# Patient Record
Sex: Female | Born: 1955 | Race: White | Hispanic: No | Marital: Married | Smoking: Never smoker
Health system: Southern US, Community
[De-identification: ages and names within clinical notes are randomized; demographics above are authoritative.]

## PROBLEM LIST (undated history)

## (undated) DIAGNOSIS — K219 Gastro-esophageal reflux disease without esophagitis: Secondary | ICD-10-CM

## (undated) DIAGNOSIS — I10 Essential (primary) hypertension: Secondary | ICD-10-CM

## (undated) DIAGNOSIS — J986 Disorders of diaphragm: Secondary | ICD-10-CM

## (undated) DIAGNOSIS — K900 Celiac disease: Secondary | ICD-10-CM

## (undated) DIAGNOSIS — J3800 Paralysis of vocal cords and larynx, unspecified: Secondary | ICD-10-CM

## (undated) HISTORY — PX: BREAST EXCISIONAL BIOPSY: SUR124

## (undated) HISTORY — DX: Disorders of diaphragm: J98.6

## (undated) HISTORY — DX: Essential (primary) hypertension: I10

## (undated) HISTORY — DX: Paralysis of vocal cords and larynx, unspecified: J38.00

## (undated) HISTORY — PX: SPINE SURGERY: SHX786

## (undated) HISTORY — PX: ROTATOR CUFF REPAIR: SHX139

## (undated) HISTORY — PX: BREAST SURGERY: SHX581

## (undated) HISTORY — DX: Gastro-esophageal reflux disease without esophagitis: K21.9

## (undated) HISTORY — DX: Celiac disease: K90.0

## (undated) HISTORY — PX: DILATION AND CURETTAGE OF UTERUS: SHX78

---
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
## 2000-08-05 ENCOUNTER — Ambulatory Visit (HOSPITAL_COMMUNITY): Admission: RE | Admit: 2000-08-05 | Discharge: 2000-08-05 | Payer: Self-pay | Admitting: Pulmonary Disease

## 2000-08-13 ENCOUNTER — Ambulatory Visit (HOSPITAL_COMMUNITY): Admission: RE | Admit: 2000-08-13 | Discharge: 2000-08-13 | Payer: Self-pay | Admitting: Pulmonary Disease

## 2000-08-30 ENCOUNTER — Ambulatory Visit (HOSPITAL_COMMUNITY): Admission: RE | Admit: 2000-08-30 | Discharge: 2000-08-30 | Payer: Self-pay | Admitting: Urology

## 2000-08-30 ENCOUNTER — Encounter: Payer: Self-pay | Admitting: Urology

## 2000-09-11 ENCOUNTER — Encounter: Payer: Self-pay | Admitting: Urology

## 2000-09-11 ENCOUNTER — Ambulatory Visit (HOSPITAL_COMMUNITY): Admission: RE | Admit: 2000-09-11 | Discharge: 2000-09-11 | Payer: Self-pay | Admitting: Urology

## 2000-10-18 ENCOUNTER — Ambulatory Visit (HOSPITAL_COMMUNITY): Admission: RE | Admit: 2000-10-18 | Discharge: 2000-10-18 | Payer: Self-pay | Admitting: Internal Medicine

## 2000-10-18 ENCOUNTER — Encounter: Payer: Self-pay | Admitting: Internal Medicine

## 2000-11-18 ENCOUNTER — Ambulatory Visit (HOSPITAL_COMMUNITY): Admission: RE | Admit: 2000-11-18 | Discharge: 2000-11-18 | Payer: Self-pay | Admitting: Internal Medicine

## 2001-01-06 ENCOUNTER — Ambulatory Visit (HOSPITAL_COMMUNITY): Admission: RE | Admit: 2001-01-06 | Discharge: 2001-01-06 | Payer: Self-pay | Admitting: Internal Medicine

## 2001-02-04 ENCOUNTER — Other Ambulatory Visit: Admission: RE | Admit: 2001-02-04 | Discharge: 2001-02-04 | Payer: Self-pay | Admitting: Obstetrics and Gynecology

## 2001-08-05 ENCOUNTER — Ambulatory Visit (HOSPITAL_COMMUNITY): Admission: RE | Admit: 2001-08-05 | Discharge: 2001-08-05 | Payer: Self-pay | Admitting: Pulmonary Disease

## 2001-08-05 ENCOUNTER — Encounter: Payer: Self-pay | Admitting: Obstetrics and Gynecology

## 2001-08-05 ENCOUNTER — Ambulatory Visit (HOSPITAL_COMMUNITY): Admission: RE | Admit: 2001-08-05 | Discharge: 2001-08-05 | Payer: Self-pay | Admitting: Obstetrics and Gynecology

## 2001-09-15 ENCOUNTER — Encounter: Payer: Self-pay | Admitting: Orthopaedic Surgery

## 2001-09-15 ENCOUNTER — Ambulatory Visit (HOSPITAL_COMMUNITY): Admission: RE | Admit: 2001-09-15 | Discharge: 2001-09-15 | Payer: Self-pay | Admitting: Orthopaedic Surgery

## 2002-02-04 ENCOUNTER — Ambulatory Visit (HOSPITAL_COMMUNITY): Admission: RE | Admit: 2002-02-04 | Discharge: 2002-02-04 | Payer: Self-pay | Admitting: Pulmonary Disease

## 2002-09-29 ENCOUNTER — Ambulatory Visit (HOSPITAL_COMMUNITY): Admission: RE | Admit: 2002-09-29 | Discharge: 2002-09-29 | Payer: Self-pay | Admitting: Specialist

## 2002-09-29 ENCOUNTER — Encounter: Payer: Self-pay | Admitting: Specialist

## 2005-04-30 ENCOUNTER — Ambulatory Visit (HOSPITAL_COMMUNITY): Admission: RE | Admit: 2005-04-30 | Discharge: 2005-04-30 | Payer: Self-pay | Admitting: Obstetrics and Gynecology

## 2006-04-26 ENCOUNTER — Ambulatory Visit (HOSPITAL_COMMUNITY): Admission: RE | Admit: 2006-04-26 | Discharge: 2006-04-26 | Payer: Self-pay | Admitting: Obstetrics and Gynecology

## 2006-12-31 ENCOUNTER — Ambulatory Visit: Payer: Self-pay | Admitting: Gastroenterology

## 2007-01-17 ENCOUNTER — Encounter: Payer: Self-pay | Admitting: Internal Medicine

## 2007-01-17 ENCOUNTER — Ambulatory Visit: Payer: Self-pay | Admitting: Internal Medicine

## 2007-01-17 ENCOUNTER — Ambulatory Visit (HOSPITAL_COMMUNITY): Admission: RE | Admit: 2007-01-17 | Discharge: 2007-01-17 | Payer: Self-pay | Admitting: Internal Medicine

## 2007-02-13 ENCOUNTER — Encounter (HOSPITAL_COMMUNITY): Admission: RE | Admit: 2007-02-13 | Discharge: 2007-03-15 | Payer: Self-pay | Admitting: Internal Medicine

## 2007-03-20 ENCOUNTER — Ambulatory Visit: Payer: Self-pay | Admitting: Internal Medicine

## 2007-04-29 ENCOUNTER — Ambulatory Visit (HOSPITAL_COMMUNITY): Admission: RE | Admit: 2007-04-29 | Discharge: 2007-04-29 | Payer: Self-pay | Admitting: Obstetrics and Gynecology

## 2007-09-16 ENCOUNTER — Other Ambulatory Visit: Admission: RE | Admit: 2007-09-16 | Discharge: 2007-09-16 | Payer: Self-pay | Admitting: Obstetrics and Gynecology

## 2007-11-04 DIAGNOSIS — J301 Allergic rhinitis due to pollen: Secondary | ICD-10-CM | POA: Insufficient documentation

## 2007-11-04 DIAGNOSIS — R1013 Epigastric pain: Secondary | ICD-10-CM

## 2007-11-04 DIAGNOSIS — R198 Other specified symptoms and signs involving the digestive system and abdomen: Secondary | ICD-10-CM

## 2007-11-04 DIAGNOSIS — E781 Pure hyperglyceridemia: Secondary | ICD-10-CM

## 2007-11-04 DIAGNOSIS — K921 Melena: Secondary | ICD-10-CM

## 2007-11-04 DIAGNOSIS — R740 Nonspecific elevation of levels of transaminase and lactic acid dehydrogenase [LDH]: Secondary | ICD-10-CM

## 2007-11-05 DIAGNOSIS — K900 Celiac disease: Secondary | ICD-10-CM

## 2007-11-05 DIAGNOSIS — K219 Gastro-esophageal reflux disease without esophagitis: Secondary | ICD-10-CM | POA: Insufficient documentation

## 2008-05-24 ENCOUNTER — Ambulatory Visit (HOSPITAL_COMMUNITY): Admission: RE | Admit: 2008-05-24 | Discharge: 2008-05-24 | Payer: Self-pay | Admitting: Obstetrics and Gynecology

## 2010-08-15 NOTE — Consult Note (Signed)
NAMEANJANA, CHEEK                 ACCOUNT NO.:  [id number]   MEDICAL RECORD NO.:  [id number]           PATIENT TYPE:  AMB   LOCATION:  DAY                           FACILITY:  [facility]   PHYSICIAN:  R. Garfield Cornea, M.D. DATE OF BIRTH:  [birth year]   DATE OF CONSULTATION:  12/31/2006  DATE OF DISCHARGE:                                 CONSULTATION   REASON FOR CONSULTATION:  Right upper quadrant abdominal pain, rectal  bleeding.   HISTORY OF PRESENT ILLNESS:  The patient is a 55 year old Caucasian  female who presents for further evaluation of right upper quadrant  abdominal pain and rectal bleeding.  She states for 7 to 8 weeks she has  had postprandial epigastric/right upper quadrant abdominal pain  associated with nausea but no vomiting.  She complains of early satiety  and abdominal bloating postprandially. She denies any heartburn,  dysphagia or odynophagia. She is on famotidine 20 mg daily for her GERD.  She has been on this for about 2 years.  Previously she was on Prilosec.  She also takes Aleve once daily.  She had been taking digestive enzymes  daily.  She has a history of celiac disease.  She has changed her diet  and hopes to lose some weight she states. In actuality, she has gained  about 10 pounds in the last 2 months.  She is following [employer].  She has been trying to follow a gluten-free diet strictly.  Her bowel  movements occur about every other day.  She denies any hard stools.  Often her stools are watery.  She has passed fresh blood per rectum for  about 2 weeks.  She had an abdominal ultrasound on 12/12/06, which  revealed fatty liver.  TSH was normal, CBC normal, AST was 26, ALT  slightly elevated at 43.  Glucose was 91.   ALLERGIES:  CODEINE.   PAST MEDICAL HISTORY:  1. Hypertension.  2. GERD.  3. Celiac disease.  4. Seasonal allergies.  5. Colonoscopy October of 2002, was normal including normal terminal      ileum.  6. EGD August of 2002, revealed  a small hiatal hernia.  Biopsies were      positive for celiac disease.   PAST SURGICAL HISTORY:  1. She had fusion on the lumbar spine.  2. Tubal ligation.  3. D and C.  4. Partial hysterectomy.   FAMILY HISTORY:  Mother is deceased age 72. Had a history of breast  cancer and pulmonary embolus.  Father is 35 and in good health. No  family history of colorectal cancer.   SOCIAL HISTORY:  She is married.  She has no children.  She is employed  at [employer].  She has never been a smoker.  No alcohol or drug use.   REASON FOR ADMISSION:  GI:  HPI.  CONSTITUTIONAL:  Denies any weight,  actually has gained 10 pounds in 2 months.  CARDIOPULMONARY:  Denies  chest pain or shortness of breath.  GENITOURINARY: Denies dysuria or  hematuria.   PHYSICAL EXAMINATION:  VITAL SIGNS: Weight 175, height  5 feet 7 inches,  temp 98.4, blood pressure 110/80, pulse 64.  GENERAL: Pleasant, well-nourished, well-developed Caucasian female in no  acute distress.  SKIN: Warm and dry.  HEENT: Sclerae nonicteric.  Oropharyngeal mucosa moist and pink.  No  lesions, erythema or exudate.  No lymphadenopathy.  CHEST:  Lungs are clear to auscultation.  CARDIAC:  Exam reveals regular rate and rhythm.  No murmurs, rubs or  gallops.  ABDOMEN:  Positive bowel sounds.  The abdomen is soft, nondistended. She  has mild epigastric/right upper quadrant tenderness to deep palpation.  No organomegaly or masses.  No rebound, tenderness or guarding.  No  hepatosplenomegaly or masses.  RECTAL:  Exam reveals small external hemorrhoid.  No evidence of  thrombosis or bleeding.  No masses in the rectal vault.  Secretions are  heme positive.  EXTREMITIES:  No edema.   IMPRESSION:  The patient is a 55 year old lady with history of celiac  disease and chronic gastroesophageal reflux disease who presents with a  7 to 8 weeks history of postprandial epigastric/right upper quadrant  abdominal discomfort, bloating, early satiety  and nausea. She denies any  typical gastroesophageal reflux disease symptoms. She was switched from  a proton pump inhibitor to H2 blocker a couple of years ago.  Recent  abdominal ultrasound unremarkable. Her ALT was slightly elevated, but  this is most likely due to fatty liver based on ultrasound.  She may be  having some dyspepsia related to recent change in her diet.  She is on  Aleve which would increase her risk for peptic ulcer disease.  She also  has a 2-week history of hematochezia with bowel movements most likely  due to a benign anorectal source such has hemorrhoids.  Last endoscopic  evaluation was over 6 years ago.   PLAN:  1. EGD and colonoscopy in the near future.  2. Will recheck her LFTs.  3. Change famotidine to Nexium 40 mg daily, #2 weeks' samples      provided.  4. Further recommendations to follow.   I would like to thank Dr. Scotty Court for allowing [name] to take part in the  care of this patient.      Neil Crouch, P.ABridgette Habermann, M.D.  Electronically Signed    LL/MEDQ  D:  12/31/2006  T:  12/31/2006  Job:  [id number]   cc:   Matthias Hughs  Fax: [phone]

## 2010-08-15 NOTE — Assessment & Plan Note (Signed)
NAME:  ASHLE, STIEF                  CHART#:  [id number]   DATE:  03/20/2007                       DOB:  [birth year]   HISTORY OF PRESENT ILLNESS:  Followup epigastric, right upper quadrant  abdominal pain, history of blunted villi but negative transglutaminase  antibody screen when she was seen recently for the above mentioned  symptoms.  Underwent EGD on 01/17/2007 which demonstrated small hiatal  hernia.  Had some erosions in her antrum.  Her small bowel appeared  normal and was rebiopsied.  Her small bowel appeared normal  histologically and she had some reactive gastropathy changes in her  gastric mucosal biopsies.  Ultrasound demonstrated fatty appearing liver  and gallbladder looked okay.  HIDA scan with a fatty meal demonstrated  gallbladder EF of 49%.  It was 55% in 2002.  She was switched from  famotidine to Nexium when she was seen here 12/31/2006.  She was overall  significantly improved.  Epigastric and right upper quadrant discomfort  has subsided.  She for the better part of the past 10 years has tried to  adhere to a gluten free diet as this has always made her feel better but  she has not always been dogmatic about complete abstinence of gluten.  She had a colonoscopy back in 2002 which revealed normal rectum, colon  and terminal ileum.  On routine labs she had an isolated elevated SGPT  of 55 with her SGOT, alkaline phosphatase and bilirubin being in the  normal range.   CURRENT MEDICATIONS:  See updated list.   ALLERGIES:  Codeine.   PHYSICAL EXAMINATION:  GENERAL:  Exam today appears very pleasant 55-  year-old lady resting comfortably.  VITAL SIGNS:  Weight 170, height 5 feet 7 inches, BP 118/70, pulse 60.  SKIN:  Skin warm and dry.  ABDOMEN:  Abdomen is flat.  Positive bowel sounds.  Soft, nontender  without appreciable mass or hepatosplenomegaly.   ASSESSMENT:  Vague epigastric, right upper quadrant abdominal pain,  better with switching from H2 blocker to  Nexium.  She has history of  villous atrophy with small bowel biopsies with more recent biopsy  demonstrating normal villous architecture in a setting of a swelling  gluten restricted diet.  Her transglutaminase antibody was previously  negative.  I suspect this lady has more of an element of gluten  sensitivity or allergy rather than true celiac disease although as there  is a spectrum.  I suspect she is having more gastroesophageal reflux  disease to account for her recent upper GI symptoms than anything else.  She has a slight diminution in her gallbladder EF and this may be  heralding clinically overt biliary dyskinesia in the future but at this  point in time I have recommended Ms. Newhard not pursue gallbladder  intervention further.   RECOMMENDATIONS:  1. Continue Nexium 40 mg early daily.  2. Continue gluten restriction.  Mildly elevated SGPT in a setting of      a fatty appearing liver on ultrasound.  3. Aerobic exercise 30 minutes 3x weekly.  4. A 10 pound weight loss in the next 12 months.  Will repeat LFTs in      3 months.  Further recommendations to follow.       Bridgette Habermann, M.D.  Electronically Signed     RMR/MEDQ  D:  03/20/2007  T:  03/21/2007  Job:  [id number]   cc:   Matthias Hughs

## 2010-08-15 NOTE — Op Note (Signed)
NAMEBRIT, Jordan Weaver                 ACCOUNT NO.:  [id number]   MEDICAL RECORD NO.:  [id number]          PATIENT TYPE:  AMB   LOCATION:  DAY                           FACILITY:  [facility]   PHYSICIAN:  R. Garfield Cornea, M.D. DATE OF BIRTH:  [birth year]   DATE OF PROCEDURE:  01/17/2007  DATE OF DISCHARGE:                               OPERATIVE REPORT   PROCEDURES:  EGD with gastric and small bowel biopsy followed by  colonoscopy diagnostic.   INDICATIONS FOR PROCEDURE:  The patient is a 55 year old lady with a  history of celiac disease, being evaluated for epigastric right upper  quadrant abdominal pain, some postprandial epigastric right upper  quadrant discomfort. Gallbladder ultrasound demonstrated no biliary  abnormalities aside from a fatty appearing liver, mildly elevated SGPT.  She tells me that she is moderately compliant with a gluten-free diet.  She is in here with hematochezia, but there is no family history of  colorectal dysplasia.  Her last colonoscopy was 2002, and was normal.  EGD and colonoscopy now being done. The procedure has been discussed  with the patient at length, potential risks, benefits and alternatives  were reviewed. All questions answered. Please see documentation of  medical records and procedure note. O2 saturation, blood pressure, pulse  and respirations were monitored throughout the procedure.   SEDATION:  Conscious procedure. Versed 5 mg IV, Demerol 100 mg IV in  divided doses. See postop note for anesthesia.   INSTRUMENT:  Pentax video system.   FINDINGS:  EGD. Examination revealed a normal appearing esophagus,  EG  junctioneasily transversed into the stomach. The gastric cavity was  insufflated well with air. Examination of the gastric mucosa included  retroflex visualization of the proximal stomach, esophagus, gastric  junction demonstrated only a small hiatal hernia and some nodularity of  the antrum with some tiny erosions. No infiltrative  process or ulcer or  other abnormalities were observed. The pylorus was patent. Easily  transversed the stomach bulb, second and third portion revealed possibly  a relative paucity of folds.  There was no scalloping in the  mucosa,  erosion, ulcers or other abnormality.  Therapeutic  diagnostic maneuvers  performed.  Multiple biopsies D2 and D3 taken to assess activity of  celiac disease. Biopsies of the antral mucosa were taken. The patient  tolerated the procedure well and was prepared for colonoscopy. Digital  rectal exam revealed no abnormalities. The prep was good. The colonic  mucosa was surveyed from the rectosigmoid junction to the left  transverse right colon. Then it transversed the  orifice, ileocecal  valve and cecum. These structures were well seen. The terminal ileum was  intubated to 10 cm from the sulcus. The scope was slowly and cautiously  withdrawal. . All previous mucosal surfaces were again seen. Took great  pains to flatten each fold with tip flexion so the mucosa was well seen.  The colonic mucosa appeared normal. The scope was pulled down to the  rectum where  examination of the rectal mucosa including a retroflex  view of the anal verge which only  demonstrated friable  echo down the  ____________ otherwise rectal mucosa appeared normal. The patient  tolerated the procedure well and was reacted in endoscopy.   IMPRESSION:  EGD normal esophagus, small hiatal hernia. Some nodularity  of the antrum with tiny overlying erosions, doubtful of clinical  significance, status post biopsy otherwise normal. The stomach patent  pylorus. Subjectively mild dual posity of folds in the duodenum, status  post biopsies of D2, D3, otherwise the duodenal mucosa appeared normal.   COLONOSCOPY FINDINGS:  The anal papilla friable anal canal, otherwise  normal rectum, colon and terminal ileum.   RECOMMENDATIONS:  1. Anusol HC suppositories one per rectum at bed time x ten days. Mrs.       Weaver is to let me know if bleeding recurs.  2. Follow up  on path. Symptoms may still be related to gallbladder      disease. She may need a HIDA, also if she has not been meticulous      about gluten-free diet, celiac disease could explain some of her      symptoms. Would also consider giving some consideration to imaging      of her small bowel further.  If no other cause is found for her      abdominal symptoms as there is an increased risk of lymphoma and      adenocarcinoma of small bowel in patients with celiac disease      particularly those individuals, who have active disease for a      protracted period of time. Further recommendations to follow. She      was changed out from famotidine to Nexium 40 mg daily recently.      Will continue on Nexium empirically for the time being.      Bridgette Habermann, M.D.  Electronically Signed     RMR/MEDQ  D:  01/17/2007  T:  01/19/2007  Job:  [id number]   cc:   Matthias Hughs  Fax: [phone]

## 2010-08-18 NOTE — Op Note (Signed)
[facility]  Patient:    Jordan Weaver, Jordan Weaver Visit Number: [id number] MRN: [id number]          Service Type: END Location: DAY Attending Physician:  Bridgette Habermann Dictated by:   Garfield Cornea, M.D. Proc. Date: 01/06/01 Admit Date:  01/06/2001                             Operative Report  PROCEDURE:  Colonoscopy.  ENDOSCOPIST:  Garfield Cornea, M.D.  INDICATIONS FOR PROCEDURE:  The patient is a 55 year old Caucasian ____ with an iron deficiency anemia.  The patient also has flattened villi on small bowel biopsy and a positive celiac antibody.  Colonoscopy is now being done to rule out a lesion in her right colon before embarking on a gluten-free diet.  This approach has been discussed with the patient previously.  Potential risks, benefits and alternatives have been reviewed, questions answered and the patient is agreeable.  Please see my dictated H&P for more information.  PROCEDURE NOTE:  O2 saturation, blood pressure, pulse and respirations were monitored throughout the entire procedure.  CONSCIOUS SEDATION:  IV Versed and Demerol.  INSTRUMENT:  Olympus video chip colonoscope.  FINDINGS:  Digital rectal examination revealed no abnormalities.  ENDOSCOPIC FINDINGS:  The prep was good.  Rectum:  Examination of the rectal mucosa including a retroflexed view of the anal verge revealed no abnormalities.  Colon:  Colonic mucosa was surveyed from the rectosigmoid junction through the left, transverse and right colon to the area of the appendiceal orifice, ileocecal valve and cecum.  No colonic mucosal abnormalities were noted upon advancing the scope to the cecum.  The terminal ileum was intubated to 10 cm. This segment of GI tract also appeared normal.  From this level, the scope was slowly withdrawn and all previously mentioned mucosal surfaces were again seen and no abnormalities were observed.  The patient tolerated the procedure well and was  reacted at endoscopy.  IMPRESSION: 1. Normal rectum. 2. Normal colon. 3. Normal terminal ileum.  RECOMMENDATIONS:  We will proceed with a dietary referral for a gluten-free diet.  We will see the patient back in the office in six weeks. Dictated by:   Garfield Cornea, M.D. Attending Physician:  Bridgette Habermann DD:  01/06/01 TD:  01/07/01 Job: [id number]

## 2012-06-18 ENCOUNTER — Other Ambulatory Visit: Payer: Self-pay | Admitting: Obstetrics and Gynecology

## 2012-06-18 NOTE — Telephone Encounter (Signed)
Noted  

## 2013-01-23 ENCOUNTER — Other Ambulatory Visit: Payer: Self-pay | Admitting: Obstetrics and Gynecology

## 2013-01-23 DIAGNOSIS — Z139 Encounter for screening, unspecified: Secondary | ICD-10-CM

## 2013-02-03 ENCOUNTER — Ambulatory Visit (HOSPITAL_COMMUNITY)
Admission: RE | Admit: 2013-02-03 | Discharge: 2013-02-03 | Disposition: A | Discharge status: Home / Self Care | Payer: BC Managed Care – PPO | Source: Ambulatory Visit | Attending: Obstetrics and Gynecology | Admitting: Obstetrics and Gynecology

## 2013-02-03 DIAGNOSIS — Z1231 Encounter for screening mammogram for malignant neoplasm of breast: Secondary | ICD-10-CM | POA: Insufficient documentation

## 2013-02-03 DIAGNOSIS — Z139 Encounter for screening, unspecified: Secondary | ICD-10-CM

## 2014-01-13 ENCOUNTER — Other Ambulatory Visit: Payer: Self-pay | Admitting: Hematology and Oncology

## 2014-01-13 ENCOUNTER — Other Ambulatory Visit (HOSPITAL_COMMUNITY): Payer: Self-pay | Admitting: Family Medicine

## 2014-01-13 DIAGNOSIS — Z1231 Encounter for screening mammogram for malignant neoplasm of breast: Secondary | ICD-10-CM

## 2014-02-05 ENCOUNTER — Ambulatory Visit (HOSPITAL_COMMUNITY)
Admission: RE | Admit: 2014-02-05 | Discharge: 2014-02-05 | Disposition: A | Discharge status: Home / Self Care | Payer: BC Managed Care – PPO | Source: Ambulatory Visit | Attending: Family Medicine | Admitting: Family Medicine

## 2014-02-05 DIAGNOSIS — Z1231 Encounter for screening mammogram for malignant neoplasm of breast: Secondary | ICD-10-CM

## 2014-03-30 ENCOUNTER — Ambulatory Visit: Payer: Self-pay | Admitting: Obstetrics and Gynecology

## 2014-04-05 ENCOUNTER — Encounter: Payer: Self-pay | Admitting: Obstetrics and Gynecology

## 2014-04-05 ENCOUNTER — Ambulatory Visit (INDEPENDENT_AMBULATORY_CARE_PROVIDER_SITE_OTHER): Payer: 59 | Admitting: Obstetrics and Gynecology

## 2014-04-05 VITALS — BP 140/88 | Ht 67.0 in | Wt 167.0 lb

## 2014-04-05 DIAGNOSIS — N904 Leukoplakia of vulva: Secondary | ICD-10-CM

## 2014-04-05 MED ORDER — CLOBETASOL PROPIONATE 0.05 % EX OINT: 1.0000 "application " | TOPICAL_OINTMENT | Freq: Two times a day (BID) | CUTANEOUS | Status: DC

## 2014-04-05 NOTE — Progress Notes (Signed)
Patient ID: Jordan Weaver, female   DOB: [birth year], 59 y.o.   MRN: [id number]   [facility] Visit  Patient name: Jordan Weaver MRN [id number]  Date of birth: December 02, 1955  CC & HPI:  Jordan Weaver is a 59 y.o. female presenting today for constant vaginal tenderness with associated swelling that started 2 weeks ago.  She is sexually active but does not have sex often because it aggravates her pain.  She has applied clobetasol to the area with temporary relief to her symptoms.  In the past this was effective.   ROS:  All systems have been reviewed and are negative unless otherwise indicated in the HPI.  Pertinent History Reviewed:   Reviewed: Significant for lichen sclerosus and left salpingoophorectomy  Medical         Past Medical History  Diagnosis Date  . GERD (gastroesophageal reflux disease)   . Celiac disease                               Surgical Hx:    Past Surgical History  Procedure Laterality Date  . Spine surgery    . Dilation and curettage of uterus      multpile  . Salpingoophorectomy Left 1975  . Breast surgery      knot removed   Medications: Reviewed & Updated - see associated section                      Current outpatient prescriptions: aspirin 81 MG tablet, Take 81 mg by mouth daily., Disp: , Rfl: ;  benazepril (LOTENSIN) 10 MG tablet, , Disp: , Rfl: 3;  Biotin 5000 MCG CAPS, Take 1 capsule by mouth daily., Disp: , Rfl: ;  clobetasol cream (TEMOVATE) 0.05 %, APPLY A 1/2 INCH STRIP TO AFFECTED SKIN EACH DAY FOR ONE WEEK, THENTWICE WEEKLY., Disp: 30 g, Rfl: 3;  furosemide (LASIX) 20 MG tablet, Take 10 mg by mouth every morning., Disp: , Rfl: 3 Multiple Vitamin (MULTIVITAMIN) tablet, Take 1 tablet by mouth daily., Disp: , Rfl: ;  Nutritional Supplements (ESTROVEN MAXIMUM STRENGTH) TABS, Take 1 tablet by mouth daily., Disp: , Rfl: ;  omeprazole (PRILOSEC) 20 MG capsule, Take 20 mg by mouth daily., Disp: , Rfl: ;  spironolactone (ALDACTONE) 25 MG tablet, Take 25 mg  by mouth 2 (two) times daily., Disp: , Rfl:    Social History: Reviewed -  reports that she has never smoked. She has never used smokeless tobacco.  Objective Findings:  Vitals: Blood pressure 140/88, height 5' 7"  (1.702 m), weight 167 lb (75.751 kg).  Physical Examination: General appearance - alert, well appearing, and in no distress Pelvic - VULVA: normal appearing vulva with no masses, tenderness or lesions,  VAGINA: normal appearing vagina with normal color and discharge, no lesions, loss of labia minora, lichenification with papyraceous changes, some introital stenosis.    Assessment & Plan:   A: atrophic vulvar dystrophy 1.   P:  1. Rx Testosterone 2% in petrolatum 2. Clobetasol 0.05% 3 rechk 2 months  This chart was scribed for Jordan Kind, MD by Donato Schultz, ED Scribe. This patient was seen in Room 1 and the patient's care was started at 3:35 PM.

## 2014-04-05 NOTE — Progress Notes (Signed)
Patient ID: Jordan Weaver, female   DOB: [birth year], 59 y.o.   MRN: [id number] Pt here today for vaginal tenderness. Pt states that she has had this problem before but it has flared up again.

## 2014-04-22 ENCOUNTER — Telehealth: Payer: Self-pay | Admitting: Obstetrics and Gynecology

## 2014-04-22 NOTE — Telephone Encounter (Signed)
Pt has some questions about the medications she was given at her last visit. Pt would like to discuss one of the medications with Dr. Glo Herring.

## 2014-04-27 NOTE — Telephone Encounter (Signed)
Unable to reach by phone., left message.

## 2014-06-03 ENCOUNTER — Ambulatory Visit (INDEPENDENT_AMBULATORY_CARE_PROVIDER_SITE_OTHER): Payer: 59 | Admitting: Obstetrics and Gynecology

## 2014-06-03 ENCOUNTER — Encounter: Payer: Self-pay | Admitting: Obstetrics and Gynecology

## 2014-06-03 VITALS — BP 128/84 | HR 88 | Ht 67.0 in | Wt 166.0 lb

## 2014-06-03 DIAGNOSIS — N904 Leukoplakia of vulva: Secondary | ICD-10-CM

## 2014-06-03 NOTE — Progress Notes (Addendum)
Patient ID: Jordan Weaver, female   DOB: 24-Jan-1956, 59 y.o.   MRN: [id number] Pt here today for follow up Pt states that things are about the same.    [facility] Visit  Patient name: Jordan Weaver MRN [id number]  Date of birth: 1955/11/06  CC & HPI:  Jordan Weaver is a 59 y.o. female presenting today for followup vulvar discomfort  ROS:  Pt did not get comfort from wicking with tissue.   Pertinent History Reviewed:   Reviewed: Significant for  Medical         Past Medical History  Diagnosis Date  . GERD (gastroesophageal reflux disease)   . Celiac disease                               Surgical Hx:    Past Surgical History  Procedure Laterality Date  . Spine surgery    . Dilation and curettage of uterus      multpile  . Salpingoophorectomy Left 1975  . Breast surgery      knot removed   Medications: Reviewed & Updated - see associated section                       Current outpatient prescriptions:  .  aspirin 81 MG tablet, Take 81 mg by mouth daily., Disp: , Rfl:  .  benazepril (LOTENSIN) 10 MG tablet, , Disp: , Rfl: 3 .  Biotin 5000 MCG CAPS, Take 1 capsule by mouth daily., Disp: , Rfl:  .  clobetasol cream (TEMOVATE) 0.05 %, APPLY A 1/2 INCH STRIP TO AFFECTED SKIN EACH DAY FOR ONE WEEK, THENTWICE WEEKLY., Disp: 30 g, Rfl: 3 .  furosemide (LASIX) 20 MG tablet, Take 10 mg by mouth every morning., Disp: , Rfl: 3 .  Multiple Vitamin (MULTIVITAMIN) tablet, Take 1 tablet by mouth daily., Disp: , Rfl:  .  Nutritional Supplements (ESTROVEN MAXIMUM STRENGTH) TABS, Take 1 tablet by mouth daily., Disp: , Rfl:  .  omeprazole (PRILOSEC) 20 MG capsule, Take 20 mg by mouth daily., Disp: , Rfl:  .  spironolactone (ALDACTONE) 25 MG tablet, Take 25 mg by mouth 2 (two) times daily., Disp: , Rfl:    Social History: Reviewed -  reports that she has never smoked. She has never used smokeless tobacco.  Objective Findings:  Vitals: Blood pressure 128/84, pulse 88, height 5' 7"  (1.702  m), weight 166 lb (75.297 kg).  Physical Examination: General appearance - alert, well appearing, and in no distress, oriented to person, place, and time and overweight Abdomen - soft, nontender, nondistended, no masses or organomegaly Pelvic - VULVA: vulvar excoriation and papyraceous tissue , vulvar hypopigmentation no lesions suspicious for cancer, VAGINA: normal appearing vagina with normal color and discharge, no lesions   Assessment & Plan:   A:  1. Persistent vulvar dystrophy 2  If pt sx become worse, may require discussion of excising involved areas.

## 2014-06-07 ENCOUNTER — Ambulatory Visit: Payer: 59 | Admitting: Obstetrics and Gynecology

## 2014-06-15 NOTE — Addendum Note (Signed)
Addended by: Jonnie Kind on: 06/15/2014 06:12 PM   Modules accepted: Level of Service

## 2014-08-10 ENCOUNTER — Other Ambulatory Visit: Payer: Self-pay | Admitting: Obstetrics and Gynecology

## 2014-08-10 MED ORDER — CLOBETASOL PROPIONATE 0.05 % EX CREA: TOPICAL_CREAM | CUTANEOUS | Status: DC

## 2014-08-10 NOTE — Telephone Encounter (Signed)
[facility] called to [facility].

## 2015-09-15 ENCOUNTER — Encounter: Payer: Self-pay | Admitting: Obstetrics and Gynecology

## 2015-09-15 ENCOUNTER — Ambulatory Visit (INDEPENDENT_AMBULATORY_CARE_PROVIDER_SITE_OTHER): Payer: BLUE CROSS/BLUE SHIELD | Admitting: Obstetrics and Gynecology

## 2015-09-15 VITALS — BP 110/60 | Ht 67.0 in | Wt 159.0 lb

## 2015-09-15 DIAGNOSIS — N904 Leukoplakia of vulva: Secondary | ICD-10-CM

## 2015-09-15 MED ORDER — CLOBETASOL PROPIONATE 0.05 % EX CREA: TOPICAL_CREAM | CUTANEOUS | Status: DC

## 2015-09-15 NOTE — Progress Notes (Signed)
[facility] Visit  @DATE @            Patient name: Jordan Weaver MRN [id number]  Date of birth: 10/30/1955  CC & HPI:  Jordan Weaver is a 60 y.o. female presenting today for constant, gradually worsening vaginal irritation that has been noticeably problematic in the past several weeks. Patient states she has been having to work odd jobs recently, with more physical activity. She suspects a combination of incrased heat, sweat, and stress might be causing her symptoms. She states urinating exacerbates her irritation. Patient requests a refill of Clabetasol.   ROS:  Review of Systems  Genitourinary:       Positive for vaginal irritation.  All other systems reviewed and are negative.  Pertinent History Reviewed:   Reviewed: Significant for D&C of uterus, left salpingo-oophorectomy Medical         Past Medical History  Diagnosis Date  . GERD (gastroesophageal reflux disease)   . Celiac disease                               Surgical Hx:    Past Surgical History  Procedure Laterality Date  . Spine surgery    . Dilation and curettage of uterus      multpile  . Salpingoophorectomy Left 1975  . Breast surgery      knot removed   Medications: Reviewed & Updated - see associated section                       Current outpatient prescriptions:  .  aspirin 81 MG tablet, Take 81 mg by mouth daily., Disp: , Rfl:  .  clobetasol cream (TEMOVATE) 0.05 %, APPLY A 1/2 INCH STRIP TO AFFECTED SKIN EACH DAY FOR ONE WEEK, THENTWICE WEEKLY., Disp: 30 g, Rfl: 3 .  furosemide (LASIX) 20 MG tablet, Take 10 mg by mouth every morning., Disp: , Rfl: 3 .  Multiple Vitamin (MULTIVITAMIN) tablet, Take 1 tablet by mouth daily., Disp: , Rfl:  .  omeprazole (PRILOSEC) 20 MG capsule, Take 20 mg by mouth daily., Disp: , Rfl:  .  spironolactone (ALDACTONE) 25 MG tablet, Take 25 mg by mouth 2 (two) times daily., Disp: , Rfl:  .  benazepril (LOTENSIN) 10 MG tablet, , Disp: , Rfl: 3   Social History:  Reviewed -  reports that she has never smoked. She has never used smokeless tobacco.  Objective Findings:  Vitals: Blood pressure 110/60, height 5' 7"  (1.702 m), weight 159 lb (72.122 kg).  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  Assessment & Plan:   A:  1. Vulvar dystrophy.  P:  1. Refill Rx Clabetasol for 1 year, per pt request. 2. Follow up prn.    By signing my name below, I, Stephania Fragmin, attest that this documentation has been prepared under the direction and in the presence of Jonnie Kind, MD. Electronically Signed: Stephania Fragmin, ED Scribe. 09/15/2015. 9:44 AM.  I personally performed the services described in this documentation, which was SCRIBED in my presence.  The recorded information has been reviewed and considered accurate. It has been edited as necessary during review. Jonnie Kind, MD

## 2015-09-15 NOTE — Progress Notes (Signed)
Patient ID: Jordan Weaver, female   DOB: [birth year], 60 y.o.   MRN: [id number] Pt here today for vaginal irritation. Pt states that it is her lichen sclerosis.

## 2016-01-05 ENCOUNTER — Other Ambulatory Visit (HOSPITAL_COMMUNITY): Payer: Self-pay | Admitting: Family Medicine

## 2016-01-05 DIAGNOSIS — Z1231 Encounter for screening mammogram for malignant neoplasm of breast: Secondary | ICD-10-CM

## 2016-01-11 ENCOUNTER — Telehealth: Payer: Self-pay | Admitting: Obstetrics and Gynecology

## 2016-01-11 ENCOUNTER — Ambulatory Visit (HOSPITAL_COMMUNITY)
Admission: RE | Admit: 2016-01-11 | Discharge: 2016-01-11 | Disposition: A | Discharge status: Home / Self Care | Payer: BLUE CROSS/BLUE SHIELD | Source: Ambulatory Visit | Attending: Family Medicine | Admitting: Family Medicine

## 2016-01-11 DIAGNOSIS — Z1231 Encounter for screening mammogram for malignant neoplasm of breast: Secondary | ICD-10-CM | POA: Diagnosis not present

## 2016-01-11 NOTE — Telephone Encounter (Signed)
Pt needs a medical necessity done on a prescription that Dr. Glo Herring has prescribed her 2 weeks ago. Pt states that she called 2 weeks ago and nothing was done. Please contact [facility] and have authorization done. Please contact pt

## 2016-01-12 NOTE — Telephone Encounter (Signed)
Pt informed PA for Temovate faxed today, waiting for response from  [place]. Pt verbalized understanding.

## 2016-08-31 DIAGNOSIS — M5416 Radiculopathy, lumbar region: Secondary | ICD-10-CM | POA: Insufficient documentation

## 2016-09-20 ENCOUNTER — Telehealth (HOSPITAL_COMMUNITY): Payer: Self-pay | Admitting: Family Medicine

## 2016-09-20 ENCOUNTER — Ambulatory Visit (HOSPITAL_COMMUNITY): Payer: BLUE CROSS/BLUE SHIELD

## 2016-09-20 NOTE — Telephone Encounter (Signed)
09/20/16 pt cx because she had been vomiting and we rescheduled the appt

## 2016-09-25 ENCOUNTER — Ambulatory Visit (HOSPITAL_COMMUNITY): Payer: BLUE CROSS/BLUE SHIELD | Attending: Family Medicine | Admitting: Physical Therapy

## 2016-09-25 ENCOUNTER — Encounter (HOSPITAL_COMMUNITY): Payer: Self-pay | Admitting: Physical Therapy

## 2016-09-25 DIAGNOSIS — M545 Low back pain: Secondary | ICD-10-CM | POA: Diagnosis not present

## 2016-09-25 DIAGNOSIS — M6281 Muscle weakness (generalized): Secondary | ICD-10-CM | POA: Diagnosis present

## 2016-09-25 DIAGNOSIS — G8929 Other chronic pain: Secondary | ICD-10-CM | POA: Diagnosis present

## 2016-09-25 DIAGNOSIS — R29898 Other symptoms and signs involving the musculoskeletal system: Secondary | ICD-10-CM | POA: Diagnosis present

## 2016-09-25 NOTE — Therapy (Signed)
[place], [place], [address] Phone: [phone]   Fax:  [phone]  Physical Therapy Evaluation  Patient Details  Name: Jordan Weaver MRN: [id number] Date of Birth: [birth year] Referring Provider: Erline Levine, MD  Encounter Date: 09/25/2016      PT End of Session - 09/25/16 1659    Visit Number 1   Number of Visits 16   Date for PT Re-Evaluation 10/23/16   Authorization Type BCBS   Authorization Time Period 09/25/16 to 11/06/16   PT Start Time 1432   PT Stop Time 1515   PT Time Calculation (min) 43 min   Activity Tolerance Patient tolerated treatment well;No increased pain   Behavior During Therapy WFL for tasks assessed/performed      Past Medical History:  Diagnosis Date  . Celiac disease   . GERD (gastroesophageal reflux disease)     Past Surgical History:  Procedure Laterality Date  . BREAST SURGERY     knot removed  . DILATION AND CURETTAGE OF UTERUS     multpile  . SALPINGOOPHORECTOMY Left 1975  . SPINE SURGERY      There were no vitals filed for this visit.       Subjective Assessment - 09/25/16 1437    Subjective Pt reports that she has had history of low back pain with a lumbar fusion over 20 years ago. She noticed that her low back started bothering her again this past February. She denies any change in bowel/bladder function and reports "electric shock" down the middle of her thigh depending on which way she turns. She currently is managing her pain with ice.    Pertinent History celiac disease; Lumbar fusion over 20 years ago   Limitations House hold activities   Diagnostic tests MRI: mild disc facet degeneration L2-L3 and L3-L4; pedical screw fusion L4-L5 without stenosis; grade 1 anterolisthesis L5-S1   Currently in Pain? Yes   Pain Score 7    Pain Orientation Left;Right;Lower  Primarily the Lt    Pain Descriptors / Indicators Aching;Dull   Pain Type Chronic pain   Pain Radiating  Towards none    Pain Onset More than a month ago   Pain Frequency Constant   Aggravating Factors  sweeping/mopping/vacuuming etc; prolonged sitting or standing   Pain Relieving Factors ice; repositioning at night with pillow between knees and laying on either side; sometimes sleeping in the recliner   Effect of Pain on Daily Activities Mod limtiations            [facility] PT Assessment - 09/25/16 0001      Assessment   Medical Diagnosis LBP   Referring Provider Erline Levine, MD   Onset Date/Surgical Date --  Feb 2018   Next MD Visit 2 months    Prior Therapy none      Precautions   Precautions None     Balance Screen   Has the patient fallen in the past 6 months Yes   How many times? 1   slipped on a wet floor after mopping    Has the patient had a decrease in activity level because of a fear of falling?  No   Is the patient reluctant to leave their home because of a fear of falling?  No     Home Environment   Living Environment Private residence   Additional Comments 2 story home with basement      Prior Function   Vocation Part time employment  Vocation Requirements sweeping, mopping, vacuuming      Cognition   Overall Cognitive Status Within Functional Limits for tasks assessed     Observation/Other Assessments   Focus on Therapeutic Outcomes (FOTO)  64% limited      Sensation   Light Touch Appears Intact     ROM / Strength   AROM / PROM / Strength AROM;Strength     AROM   AROM Assessment Site Lumbar   Lumbar Flexion pull low back, repeated 10x with increased stretch    Lumbar Extension pain mid low back with end range; REIS x10 reps No change     Strength   Strength Assessment Site Hip;Knee;Ankle   Right/Left Hip Right;Left   Right Hip Flexion 3+/5   Right Hip Extension 4-/5   Right Hip ABduction 4/5   Left Hip Flexion 3/5   Left Hip Extension 4-/5   Left Hip ABduction 3+/5   Right/Left Knee Right;Left   Right Knee Flexion 4/5   Right Knee  Extension 4/5   Left Knee Flexion 4-/5   Left Knee Extension 4-/5   Right/Left Ankle Right;Left   Right Ankle Dorsiflexion 5/5   Left Ankle Dorsiflexion 4+/5     Palpation   Spinal mobility Tenderness with sprin testing of L4/L5/S1 region    Palpation comment tenderness along lumbar paraspinals and distal QL     Transfers   Five time sit to stand comments  18 sec, no UE      High Level Balance   High Level Balance Comments SLS: Lt ~5-7 sec (+) trendelenburg; Rt 7-12 sec         Objective measurements completed on examination: See above findings.           PT Education - 09/25/16 1658    Education provided Yes   Education Details eval findings/POC; benefits of skilled PT in addressing limitations and improving activity tolerance and function; implemented and reviewed HEP   Person(s) Educated Patient   Methods Explanation;Demonstration;Verbal cues;Handout   Comprehension Returned demonstration;Verbalized understanding          PT Short Term Goals - 09/25/16 2153      PT SHORT TERM GOAL #1   Title Pt will demo consistency and independence with her HEP to improve BLE strength and decrease pain.    Time 2   Period Weeks   Status New           PT Long Term Goals - 09/25/16 2156      PT LONG TERM GOAL #1   Title Pt will demo improved BLE strength to atleast 4+/5 MMT to increase her safety with work activity.    Time 8   Period Weeks   Status New     PT LONG TERM GOAL #2   Title Pt will demo improved activity tolerance, evident by her report of atleast 50% improvement in her pain and symptoms since beginning PT.    Time 8   Period Weeks   Status New     PT LONG TERM GOAL #3   Title Pt will complete 5x sit to stand in less than 13 sec, without UE support, to demonstrate significant improvements in functional strength and power.    Time 8   Period Weeks   Status New     PT LONG TERM GOAL #4   Title Pt will maintain single leg balance on each LE for  atleast 15 sec without a positive trendelenburg, 2/3 trials, to demonstrate improvements in single  leg hip stability.    Time 8   Period Weeks   Status New     PT LONG TERM GOAL #5   Title Pt will lift atleast 10# box with proper technique and with no greater than 4/10 pain report, 3/5 trials without cues from the therapist to decrease low back strain during her daily work requirements.    Time 8   Period Weeks   Status New                Plan - October 12, 2016 1700    Clinical Impression Statement Pt is a pleasant 60y.o F referred to OPPT with complaints of low back pain onset insidiously around the beginning of this year. She has a history of lumbar fusion over 20 years ago and currently denies and change in bowel/bladder function. Upon further examination, she demonstrates BLE weakness, greater on the Lt, and has palpable tenderness and muscle spasm along the lumbar paraspinals. Her functional strength and stability has also been affected as a result of her weakness and pain, evident by her poor single leg stance and performance on 5x sit to stand. She would benefit from skilled PT 2x/week for 6 weeks however at this time she is unable to manage this financially. She is agreeable to 1x/week over the course of 8 weeks with increase to 2x as able. HEP was implemented and reviewed with pt demonstrating good understanding.    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   Clinical Presentation Stable   Clinical Presentation due to: no worse/no better since the onset several months ago   Clinical Decision Making Low   Rehab Potential Good   PT Frequency Other (comment)  1-2x/week    PT Duration 8 weeks   PT Treatment/Interventions ADLs/Self Care Home Management;Balance training;Therapeutic exercise;Therapeutic activities;Functional mobility training;Stair training;Gait training;Neuromuscular re-education;Patient/family  education;Manual techniques;Passive range of motion;Dry needling;Moist Heat   PT Next Visit Plan Focus on LE strengthening (hip ext/abd; knee flexion/ext); manual to address muscle spasm   PT Home Exercise Plan BLE bridge with red TB around knees 2x10 reps; Prone hamstring curl with red TB 2x15 reps   Recommended Other Services none    Consulted and Agree with Plan of Care Patient      Patient will benefit from skilled therapeutic intervention in order to improve the following deficits and impairments:  Abnormal gait, Decreased balance, Decreased activity tolerance, Decreased endurance, Impaired flexibility, Hypomobility, Decreased strength, Decreased range of motion, Decreased mobility, Improper body mechanics, Pain, Postural dysfunction, Increased muscle spasms  Visit Diagnosis: Chronic bilateral low back pain, with sciatica presence unspecified  Muscle weakness (generalized)  Other symptoms and signs involving the musculoskeletal system      G-Codes - 2016/10/12 06/13/2201    Functional Assessment Tool Used (Outpatient Only) FOTO and clinical judgement based on assessment of ROM, strength and mobility    Functional Limitation Mobility: Walking and moving around   Mobility: Walking and Moving Around Current Status [phone]) At least 20 percent but less than 40 percent impaired, limited or restricted   Mobility: Walking and Moving Around Goal Status [phone]) At least 40 percent but less than 60 percent impaired, limited or restricted       Problem List Patient Active Problem List   Diagnosis Date Noted  . 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 GERD 11/05/2007  . CELIAC DISEASE 11/05/2007  . HYPERTRIGLYCERIDEMIA 11/04/2007  . ALLERGIC RHINITIS, SEASONAL 11/04/2007  . HEMATOCHEZIA  11/04/2007  . ABDOMINAL PAIN, EPIGASTRIC 11/04/2007  . EARLY SATIETY 11/04/2007  . NONSPEC ELEVATION OF LEVELS OF TRANSAMINASE/LDH 11/04/2007    10:07 PM,09/25/16 Elly Modena PT, DPT [facility] [address] [place], [place], [address] Phone: [phone]   Fax:  [phone]  Name: INGRA ROTHER MRN: [id number] Date of Birth: 1955-08-01

## 2016-10-02 ENCOUNTER — Ambulatory Visit (HOSPITAL_COMMUNITY): Payer: BLUE CROSS/BLUE SHIELD | Admitting: Physical Therapy

## 2016-10-09 ENCOUNTER — Encounter (HOSPITAL_COMMUNITY): Payer: BLUE CROSS/BLUE SHIELD

## 2016-10-11 ENCOUNTER — Ambulatory Visit (HOSPITAL_COMMUNITY): Payer: BLUE CROSS/BLUE SHIELD | Attending: Family Medicine | Admitting: Physical Therapy

## 2016-10-11 DIAGNOSIS — M6281 Muscle weakness (generalized): Secondary | ICD-10-CM | POA: Diagnosis present

## 2016-10-11 DIAGNOSIS — R29898 Other symptoms and signs involving the musculoskeletal system: Secondary | ICD-10-CM | POA: Insufficient documentation

## 2016-10-11 DIAGNOSIS — M545 Low back pain: Secondary | ICD-10-CM | POA: Diagnosis not present

## 2016-10-11 DIAGNOSIS — G8929 Other chronic pain: Secondary | ICD-10-CM | POA: Insufficient documentation

## 2016-10-11 NOTE — Therapy (Signed)
[place], [place], [address] Phone: [phone]   Fax:  [phone]  Physical Therapy Treatment  Patient Details  Name: Jordan Weaver MRN: [id number] Date of Birth: [birth year] Referring Provider: Erline Levine, MD  Encounter Date: 10/11/2016      PT End of Session - 10/11/16 1159    Visit Number 2   Number of Visits 16   Date for PT Re-Evaluation 10/23/16   Authorization Type BCBS   Authorization Time Period 09/25/16 to 11/06/16   PT Start Time 1118   PT Stop Time 1156   PT Time Calculation (min) 38 min   Activity Tolerance Patient tolerated treatment well   Behavior During Therapy [facility] for tasks assessed/performed      Past Medical History:  Diagnosis Date  . Celiac disease   . GERD (gastroesophageal reflux disease)     Past Surgical History:  Procedure Laterality Date  . BREAST SURGERY     knot removed  . DILATION AND CURETTAGE OF UTERUS     multpile  . SALPINGOOPHORECTOMY Left 1975  . SPINE SURGERY      There were no vitals filed for this visit.      Subjective Assessment - 10/11/16 1119    Subjective Patient reports that her back pain is just not easiing up, her exercises have increased her pain and she has found to be allergic to some medicines going on. She has a torn rotator cuff on the L.    Pertinent History celiac disease; Lumbar fusion over 20 years ago; torn rotator cuff on L    Diagnostic tests MRI: mild disc facet degeneration L2-L3 and L3-L4; pedical screw fusion L4-L5 without stenosis; grade 1 anterolisthesis L5-S1   Currently in Pain? Yes   Pain Score 7    Pain Location Back   Pain Orientation Lower;Right   Pain Descriptors / Indicators Pressure;Crushing;Heaviness   Pain Type Chronic pain   Pain Radiating Towards none    Pain Onset More than a month ago   Pain Frequency Constant   Aggravating Factors  some of her exercises, twisting for sweeping/mopping/vacuuming; steps    Pain  Relieving Factors ice    Effect of Pain on Daily Activities severe limitations                          [facility] Adult PT Treatment/Exercise - 10/11/16 0001      Exercises   Exercises Knee/Hip;Lumbar     Lumbar Exercises: Standing   Heel Raises 10 reps   Heel Raises Limitations heel and toe    Forward Lunge 10 reps   Forward Lunge Limitations 4 inch box    Other Standing Lumbar Exercises forward and lateral step ups 1x10 B 4 inch box      Lumbar Exercises: Seated   Long Arc Quad on Chair Both;1 set;10 reps   LAQ on Chair Weights (lbs) 3   LAQ on Chair Limitations 2 second holds    Other Seated Lumbar Exercises seated HS curts 1x10 2 second holds 3#     Lumbar Exercises: Supine   Bridge 15 reps   Straight Leg Raise 10 reps   Straight Leg Raises Limitations core squeeze, eccentric lower     Lumbar Exercises: Sidelying   Hip Abduction 10 reps   Hip Abduction Weights (lbs) 0                PT Education - 10/11/16  1158    Education provided Yes   Education Details review of initial eval/goals    Person(s) Educated Patient   Methods Explanation   Comprehension Verbalized understanding          PT Short Term Goals - 09/25/16 2153      PT SHORT TERM GOAL #1   Title Pt will demo consistency and independence with her HEP to improve BLE strength and decrease pain.    Time 2   Period Weeks   Status New           PT Long Term Goals - 09/25/16 2156      PT LONG TERM GOAL #1   Title Pt will demo improved BLE strength to atleast 4+/5 MMT to increase her safety with work activity.    Time 8   Period Weeks   Status New     PT LONG TERM GOAL #2   Title Pt will demo improved activity tolerance, evident by her report of atleast 50% improvement in her pain and symptoms since beginning PT.    Time 8   Period Weeks   Status New     PT LONG TERM GOAL #3   Title Pt will complete 5x sit to stand in less than 13 sec, without UE support, to demonstrate  significant improvements in functional strength and power.    Time 8   Period Weeks   Status New     PT LONG TERM GOAL #4   Title Pt will maintain single leg balance on each LE for atleast 15 sec without a positive trendelenburg, 2/3 trials, to demonstrate improvements in single leg hip stability.    Time 8   Period Weeks   Status New     PT LONG TERM GOAL #5   Title Pt will lift atleast 10# box with proper technique and with no greater than 4/10 pain report, 3/5 trials without cues from the therapist to decrease low back strain during her daily work requirements.    Time 8   Period Weeks   Status New               Plan - 10/11/16 1159    Clinical Impression Statement Patient arrives stating that some of her exercises have been worsening her pain, especially the one where she is laying on her stomach. Palpation of B hip flexors reveals some tightness and spasm, patient may benefit from STM to these groups moving forward. Focused on functional strength per evaluating PT POC this session with patient generally appearing to tolerate session well.    Rehab Potential Good   PT Frequency Other (comment)  1-2 times/week    PT Duration 8 weeks   PT Treatment/Interventions ADLs/Self Care Home Management;Balance training;Therapeutic exercise;Therapeutic activities;Functional mobility training;Stair training;Gait training;Neuromuscular re-education;Patient/family education;Manual techniques;Passive range of motion;Dry needling;Moist Heat   PT Next Visit Plan Focus on LE strengthening (hip ext/abd; knee flexion/ext); manual to address muscle spasm. STM and stretching to hip flexors.    PT Home Exercise Plan BLE bridge with red TB around knees 2x10 reps; Prone hamstring curl with red TB 2x15 reps   Consulted and Agree with Plan of Care Patient      Patient will benefit from skilled therapeutic intervention in order to improve the following deficits and impairments:  Abnormal gait, Decreased  balance, Decreased activity tolerance, Decreased endurance, Impaired flexibility, Hypomobility, Decreased strength, Decreased range of motion, Decreased mobility, Improper body mechanics, Pain, Postural dysfunction, Increased muscle spasms  Visit Diagnosis:  Chronic bilateral low back pain, with sciatica presence unspecified  Muscle weakness (generalized)  Other symptoms and signs involving the musculoskeletal system     Problem List Patient Active Problem List   Diagnosis Date Noted  . 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 GERD 11/05/2007  . CELIAC DISEASE 11/05/2007  . HYPERTRIGLYCERIDEMIA 11/04/2007  . ALLERGIC RHINITIS, SEASONAL 11/04/2007  . HEMATOCHEZIA 11/04/2007  . ABDOMINAL PAIN, EPIGASTRIC 11/04/2007  . EARLY SATIETY 11/04/2007  . NONSPEC ELEVATION OF LEVELS OF TRANSAMINASE/LDH 11/04/2007    Deniece Ree PT, DPT [facility] [address] [place], [place], [address] Phone: [phone]   Fax:  [phone]  Name: RONDELL FRICK MRN: [id number] Date of Birth: 24-Jul-1955

## 2016-10-16 ENCOUNTER — Telehealth (HOSPITAL_COMMUNITY): Payer: Self-pay | Admitting: Family Medicine

## 2016-10-16 ENCOUNTER — Ambulatory Visit (HOSPITAL_COMMUNITY): Payer: BLUE CROSS/BLUE SHIELD | Admitting: Physical Therapy

## 2016-10-16 NOTE — Telephone Encounter (Signed)
10/16/16  pt cx but I forgot to do and now have forgotten why she was cancelling

## 2016-10-18 ENCOUNTER — Telehealth (HOSPITAL_COMMUNITY): Payer: Self-pay | Admitting: Physical Therapy

## 2016-10-18 NOTE — Telephone Encounter (Signed)
Patient wants to stop rehab for now, she will be having shoulder surgery soon and want to concentrate on that

## 2016-10-19 ENCOUNTER — Ambulatory Visit (HOSPITAL_COMMUNITY): Payer: BLUE CROSS/BLUE SHIELD | Admitting: Physical Therapy

## 2016-10-23 ENCOUNTER — Ambulatory Visit (HOSPITAL_COMMUNITY): Payer: BLUE CROSS/BLUE SHIELD

## 2016-10-26 ENCOUNTER — Ambulatory Visit (HOSPITAL_COMMUNITY): Payer: BLUE CROSS/BLUE SHIELD | Admitting: Physical Therapy

## 2016-10-30 ENCOUNTER — Ambulatory Visit (HOSPITAL_COMMUNITY): Payer: BLUE CROSS/BLUE SHIELD

## 2016-11-08 ENCOUNTER — Ambulatory Visit (HOSPITAL_COMMUNITY): Payer: BLUE CROSS/BLUE SHIELD | Admitting: Occupational Therapy

## 2016-11-08 ENCOUNTER — Telehealth (HOSPITAL_COMMUNITY): Payer: Self-pay | Admitting: Family Medicine

## 2016-11-08 NOTE — Telephone Encounter (Signed)
11/08/16  I called Dr. Bettina Gavia office to get the OT referral that I had requested earlier in the week and was told that he was out of the office and the office manager was working to get the referral sent to [name].  By 10 am we still didn't have and I called the patient and cx the appt and told her what was going on.  She asked that we call her once we had the correct referral.

## 2016-11-14 ENCOUNTER — Ambulatory Visit (HOSPITAL_COMMUNITY): Payer: BLUE CROSS/BLUE SHIELD | Attending: Family Medicine

## 2016-11-14 ENCOUNTER — Encounter (HOSPITAL_COMMUNITY): Payer: Self-pay

## 2016-11-14 DIAGNOSIS — M25512 Pain in left shoulder: Secondary | ICD-10-CM | POA: Diagnosis present

## 2016-11-14 DIAGNOSIS — M25612 Stiffness of left shoulder, not elsewhere classified: Secondary | ICD-10-CM | POA: Insufficient documentation

## 2016-11-14 DIAGNOSIS — R29898 Other symptoms and signs involving the musculoskeletal system: Secondary | ICD-10-CM | POA: Diagnosis present

## 2016-11-14 NOTE — Therapy (Signed)
[facility] [place], [place], [address] Phone: [phone]   Fax:  [phone]  Occupational Therapy Evaluation  Patient Details  Name: Jordan Weaver MRN: [id number] Date of Birth: 1955-07-10 Referring Provider: Dr. Tania Weaver  Encounter Date: 11/14/2016      OT End of Session - 11/14/16 1158    Visit Number 1   Number of Visits 16   Date for OT Re-Evaluation 01/09/17  Mini reassess: 12/12/16   Authorization Type BCBS $0 co pay   Authorization Time Period Visit limit to 30  OT/PT/SLP combined. (2 used this year)   Authorization - Visit Number 3   Authorization - Number of Visits 30   OT Start Time [phone]   OT Stop Time 1030   OT Time Calculation (min) 40 min   Activity Tolerance Patient tolerated treatment well   Behavior During Therapy WFL for tasks assessed/performed      Past Medical History:  Diagnosis Date  . Celiac disease   . GERD (gastroesophageal reflux disease)     Past Surgical History:  Procedure Laterality Date  . BREAST SURGERY     knot removed  . DILATION AND CURETTAGE OF UTERUS     multpile  . SALPINGOOPHORECTOMY Left 1975  . SPINE SURGERY      There were no vitals filed for this visit.      Subjective Assessment - 11/14/16 0952    Subjective  S: My rotator cuff just wore out. I had a partial tear.   Pertinent History Patient is a 61 y/o female S/P left shoulder arthroscopy which was completed on 10/29/16 after discovering a partial tear in her rotator cuff. Patient presents today in her sling. Dr. Tamera Weaver has referred patient to occupational therapy for evaluation and treatment.    Special Tests FOTO score: 43/100   Patient Stated Goals TO be able to use my left arm as normal as I can.    Currently in Pain? Yes   Pain Score 5    Pain Location Shoulder   Pain Orientation Left   Pain Descriptors / Indicators Aching   Pain Type Surgical pain   Pain Radiating Towards None   Pain Onset  1 to 4 weeks ago   Pain Frequency Constant   Aggravating Factors  Using left arm to assist with daily tasks.   Pain Relieving Factors Ice   Effect of Pain on Daily Activities Unable to use LUE for any daily tasks.            [facility] OT Assessment - 11/14/16 0948      Assessment   Diagnosis Left shoulder arthroscopy   Referring Provider Dr. Tania Weaver   Onset Date 10/29/16  surgery   Prior Therapy None for this condition     Precautions   Precautions Shoulder   Type of Shoulder Precautions Standard arthroscopic protocol.  AA/ROM, pulleys until 3 weeks post op (11/19/16), Week 4-5 (8/27-9/3): : Begin A/ROM  Progress to strengthening with A/ROM is full.    Required Braces or Orthoses Sling  Only wears sling with out in public.     Balance Screen   Has the patient fallen in the past 6 months Yes   How many times? 5   Has the patient had a decrease in activity level because of a fear of falling?  No   Is the patient reluctant to leave their home because of a fear of falling?  No  Home  Environment   Family/patient expects to be discharged to: Private residence     Prior Function   Level of Independence Retired     ADL   ADL comments Pt reports difficulty with hooking and unhooking/hooking her bra, fixing her hair, and using her arm to complete reaching tasks above her shoulder.      Mobility   Mobility Status History of falls     Written Expression   Dominant Hand Right     Vision - History   Baseline Vision Wears glasses all the time     Cognition   Overall Cognitive Status Within Functional Limits for tasks assessed     ROM / Strength   AROM / PROM / Strength Strength;PROM;AROM     Palpation   Palpation comment Max tenderness and fascial restrictions in left upper arm, trapezius, and scapularis region.     AROM   Overall AROM Comments Assessed seated. IR/er adducted.   AROM Assessment Site Shoulder   Right/Left Shoulder Left    Left Shoulder Flexion 90 Degrees   Left Shoulder ABduction 78 Degrees   Left Shoulder Internal Rotation 90 Degrees   Left Shoulder External Rotation 60 Degrees     PROM   Overall PROM Comments Assessed supine. IR/er adducted.   PROM Assessment Site Shoulder   Right/Left Shoulder Left   Left Shoulder Flexion 125 Degrees   Left Shoulder ABduction 90 Degrees   Left Shoulder Internal Rotation 90 Degrees   Left Shoulder External Rotation 60 Degrees     Strength   Overall Strength Comments Assessed seated. IR/er adducted.   Strength Assessment Site Shoulder   Right/Left Shoulder Left   Left Shoulder Flexion 3-/5   Left Shoulder ABduction 3-/5   Left Shoulder Internal Rotation 3-/5   Left Shoulder External Rotation 3-/5                         OT Education - 11/14/16 1023    Education provided Yes   Education Details AA/ROM shoulder exercises - to be completed supine for now.   Person(s) Educated Patient   Methods Explanation;Demonstration;Handout;Verbal cues   Comprehension Returned demonstration;Verbalized understanding          OT Short Term Goals - 11/14/16 1206      OT SHORT TERM GOAL #1   Title Patient will be educated and independent with HEP to increase functional use of LUE during daily tasks.    Time 4   Period Weeks   Status New   Target Date 12/12/16     OT SHORT TERM GOAL #2   Title Patient will increase P/ROM to WNL to increase ability to completing dressing tasks with less difficulty.    Time 4   Period Weeks   Status New     OT SHORT TERM GOAL #3   Title Patient will increase LUE strenght to 3+/5 to increase ability to work towards completing activities at shoulder level with less difficulty.    Time 4   Period Weeks   Status New     OT SHORT TERM GOAL #4   Title Patient will decrease pain in LUE during daily tasks to 3/10 or less.   Time 4   Period Weeks   Status New     OT SHORT TERM GOAL #5   Title Patient will decrease  fascial restrictions to mod amount in LUE to increase functional mobility needed to complete reaching  tasks.    Time 4   Period Weeks   Status New           OT Long Term Goals - 11/14/16 1212      OT LONG TERM GOAL #1   Title Patient will return to highest level of independence while using LUE as non-dominant extremity for all daily tasks.    Time 8   Period Weeks   Status New   Target Date 01/09/17     OT LONG TERM GOAL #2   Title Patient will increase A/ROM of LUE to WNL to increase ability to complete overhead reaching tasks with less difficulty.    Time 8   Period Weeks   Status New     OT LONG TERM GOAL #3   Title Patient will increase LUE strength to 4+/5 in order to return to normal leisure activities such as gardening.    Time 8   Period Weeks   Status New     OT LONG TERM GOAL #4   Title Patient will decrease pain level in LUE during daily tasks to 2/10 or less.    Time 8   Period Weeks   Status New     OT LONG TERM GOAL #5   Title Patient will decrease fascial restrictions to min amount or less in LUE in order to hook and unhook bra from behind with less difficulty as well as fix her hair.   Time 8   Period Weeks   Status New               Plan - 11/14/16 1201    Clinical Impression Statement A: Pt is a 61 y/o female S/P left shoulder arthroscopy causing increased pain, fascial restrictions and decreased ROM and strength resulting in difficulty completing daily tasks using her LUE as non-dominant.    Occupational Profile and client history currently impacting functional performance Pt is motivated to return to prior level of function, patient was independent prior to surgery, patient has a strong social support at home.   Occupational performance deficits (Please refer to evaluation for details): ADL's;IADL's;Play;Leisure;Work   Dispensing optician   Current Impairments/barriers affecting progress: Hx of low back pain   OT Frequency 2x / week    OT Duration 8 weeks   OT Treatment/Interventions Self-care/ADL training;Ultrasound;DME and/or AE instruction;Iontophoresis;Passive range of motion;Patient/family education;Cryotherapy;Electrical Stimulation;Moist Heat;Therapeutic activities;Therapeutic exercises;Manual Therapy   Plan P: Patient will benefit from skilled OT services to increase functional performance during daily tasks when using LUE. Treatment Plan: Follow standard arthroscopy protocol. Myofascial release, manual stretching, AA/ROM, A/ROM general scapular and shoulder strengthening. Modalities PRN.    Clinical Decision Making Limited treatment options, no task modification necessary   OT Home Exercise Plan 8/15: AA/ROM shoulder exercises   Consulted and Agree with Plan of Care Patient      Patient will benefit from skilled therapeutic intervention in order to improve the following deficits and impairments:  Increased muscle spasms, Decreased strength, Decreased range of motion, Pain, Impaired UE functional use, Increased fascial restricitons  Visit Diagnosis: Other symptoms and signs involving the musculoskeletal system - Plan: Ot plan of care cert/re-cert  Acute pain of left shoulder - Plan: Ot plan of care cert/re-cert  Stiffness of left shoulder, not elsewhere classified - Plan: Ot plan of care cert/re-cert    Problem List Patient Active Problem List   Diagnosis Date Noted  . 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 GERD 11/05/2007  . CELIAC DISEASE 11/05/2007  .  HYPERTRIGLYCERIDEMIA 11/04/2007  . ALLERGIC RHINITIS, SEASONAL 11/04/2007  . HEMATOCHEZIA 11/04/2007  . ABDOMINAL PAIN, EPIGASTRIC 11/04/2007  . EARLY SATIETY 11/04/2007  . NONSPEC ELEVATION OF LEVELS OF TRANSAMINASE/LDH 11/04/2007   Ailene Ravel, OTR/L,CBIS  [phone]  11/14/2016, 12:16 PM  [facility] [address] [place], [place], [address] Phone: [phone]   Fax:  [phone]  Name: Jordan Weaver MRN: [id number] Date of Birth: 12-23-1955

## 2016-11-14 NOTE — Patient Instructions (Signed)
Perform each exercise ____10-15____ reps. 2-3x days.   Protraction - STANDING  Start by holding a wand or cane at chest height.  Next, slowly push the wand outwards in front of your body so that your elbows become fully straightened. Then, return to the original position.     Shoulder FLEXION - STANDING - PALMS Down  In the standing position, hold a wand/cane with both arms, palms down on both sides. Raise up the wand/cane allowing your unaffected arm to perform most of the effort. Your affected arm should be partially relaxed.      Internal/External ROTATION - STANDING (Towards the left only. Have left flipped up)   In the standing position, hold a wand/cane with both hands keeping your elbows bent. Move your arms and wand/cane to one side.  Your affected arm should be partially relaxed while your unaffected arm performs most of the effort.       Shoulder ABDUCTION - STANDING  While holding a wand/cane palm face up on the injured side and palm face down on the uninjured side, slowly raise up your injured arm to the side.              Horizontal Abduction/Adduction      Straight arms holding cane at shoulder height, bring cane to right, center, left. Repeat starting to left.   Copyright  V[employer]. All rights reserved.

## 2016-11-16 ENCOUNTER — Encounter (HOSPITAL_COMMUNITY): Payer: Self-pay

## 2016-11-16 ENCOUNTER — Ambulatory Visit (HOSPITAL_COMMUNITY): Payer: BLUE CROSS/BLUE SHIELD

## 2016-11-16 DIAGNOSIS — M25612 Stiffness of left shoulder, not elsewhere classified: Secondary | ICD-10-CM

## 2016-11-16 DIAGNOSIS — R29898 Other symptoms and signs involving the musculoskeletal system: Secondary | ICD-10-CM | POA: Diagnosis not present

## 2016-11-16 DIAGNOSIS — M25512 Pain in left shoulder: Secondary | ICD-10-CM

## 2016-11-16 NOTE — Therapy (Addendum)
[place], [place], [address] Phone: [phone]   Fax:  [phone]  Occupational Therapy Treatment  Patient Details  Name: Jordan Weaver MRN: [id number] Date of Birth: 08-30-1955 Referring Provider: Dr. Tania Ade  Encounter Date: 11/16/2016      OT End of Session - 11/16/16 1025    Visit Number 2   Number of Visits 16   Date for OT Re-Evaluation 01/09/17  Mini reassess: 12/12/16   Authorization Type BCBS $0 co pay   Authorization Time Period Visit limit to 30  OT/PT/SLP combined. (2 used this year)   Authorization - Visit Number 4   Authorization - Number of Visits 30   OT Start Time [phone]   OT Stop Time 1030   OT Time Calculation (min) 40 min   Activity Tolerance Patient tolerated treatment well   Behavior During Therapy WFL for tasks assessed/performed      Past Medical History:  Diagnosis Date  . Celiac disease   . GERD (gastroesophageal reflux disease)     Past Surgical History:  Procedure Laterality Date  . BREAST SURGERY     knot removed  . DILATION AND CURETTAGE OF UTERUS     multpile  . SALPINGOOPHORECTOMY Left 1975  . SPINE SURGERY      There were no vitals filed for this visit.      Subjective Assessment - 11/16/16 1023    Subjective  S: I have some soreness. Not too bad.   Currently in Pain? Yes   Pain Score 4    Pain Location Shoulder   Pain Descriptors / Indicators Aching   Pain Type Surgical pain            [facility] OT Assessment - 11/16/16 0956      Assessment   Diagnosis Left shoulder arthroscopy     Precautions   Precautions Shoulder   Type of Shoulder Precautions Standard arthroscopic protocol.  AA/ROM, pulleys until 3 weeks post op (11/19/16), Week 4-5 (8/27-9/3): : Begin A/ROM  Progress to strengthening with A/ROM is full.                   OT Treatments/Exercises (OP) - 11/16/16 1021      Exercises   Exercises Shoulder     Shoulder  Exercises: Supine   Protraction PROM;AAROM;10 reps   Horizontal ABduction PROM;AAROM;10 reps   External Rotation PROM;AAROM;10 reps   Internal Rotation PROM;AAROM;10 reps   Flexion PROM;AAROM;10 reps   ABduction PROM;AAROM;10 reps     Shoulder Exercises: Standing   Protraction AAROM;10 reps   Horizontal ABduction AAROM;10 reps   External Rotation AAROM;10 reps   Internal Rotation AAROM;10 reps   Flexion AAROM;10 reps   ABduction AAROM;10 reps     Shoulder Exercises: ROM/Strengthening   Wall Wash 1'     Manual Therapy   Manual Therapy Myofascial release   Manual therapy comments Manual therapy completed prior to exercises.   Myofascial Release Myofascial release and manual stretching completed to left upper arm, trapezius, and scapularis region to decrease fascial restrictions and increase joint mobility in a pain free zone.                  OT Education - 11/16/16 1058    Education provided Yes   Education Details Pt was given OT evaluation hand out and reviewed plan of care and goals.    Person(s) Educated Patient   Methods Explanation;Handout   Comprehension Verbalized  understanding          OT Short Term Goals - 11/16/16 1024      OT SHORT TERM GOAL #1   Title Patient will be educated and independent with HEP to increase functional use of LUE during daily tasks.    Time 4   Period Weeks   Status On-going     OT SHORT TERM GOAL #2   Title Patient will increase P/ROM to WNL to increase ability to completing dressing tasks with less difficulty.    Time 4   Period Weeks   Status On-going     OT SHORT TERM GOAL #3   Title Patient will increase LUE strenght to 3+/5 to increase ability to work towards completing activities at shoulder level with less difficulty.    Time 4   Period Weeks   Status On-going     OT SHORT TERM GOAL #4   Title Patient will decrease pain in LUE during daily tasks to 3/10 or less.   Time 4   Period Weeks   Status On-going      OT SHORT TERM GOAL #5   Title Patient will decrease fascial restrictions to mod amount in LUE to increase functional mobility needed to complete reaching tasks.    Time 4   Period Weeks   Status On-going           OT Long Term Goals - 11/16/16 1025      OT LONG TERM GOAL #1   Title Patient will return to highest level of independence while using LUE as non-dominant extremity for all daily tasks.    Time 8   Period Weeks   Status On-going     OT LONG TERM GOAL #2   Title Patient will increase A/ROM of LUE to WNL to increase ability to complete overhead reaching tasks with less difficulty.    Time 8   Period Weeks   Status On-going     OT LONG TERM GOAL #3   Title Patient will increase LUE strength to 4+/5 in order to return to normal leisure activities such as gardening.    Time 8   Period Weeks   Status On-going     OT LONG TERM GOAL #4   Title Patient will decrease pain level in LUE during daily tasks to 2/10 or less.    Time 8   Period Weeks   Status On-going     OT LONG TERM GOAL #5   Title Patient will decrease fascial restrictions to min amount or less in LUE in order to hook and unhook bra from behind with less difficulty as well as fix her hair.   Time 8   Period Weeks   Status On-going               Plan - 11/16/16 1054    Clinical Impression Statement A: Initiated myofascial release, manual stretching, and AA/ROM exercises with VC given for form and technique as needed. Patient was able to achieve passive ROM that was [facility] this session. Encouraged her to begin completing AA/ROM HEP standing at this time.   Plan P: Add pulleys and PVC pipe slide.      Patient will benefit from skilled therapeutic intervention in order to improve the following deficits and impairments:  Increased muscle spasms, Decreased strength, Decreased range of motion, Pain, Impaired UE functional use, Increased fascial restricitons  Visit Diagnosis: Other symptoms and signs  involving the musculoskeletal system  Acute pain of  left shoulder  Stiffness of left shoulder, not elsewhere classified    Problem List Patient Active Problem List   Diagnosis Date Noted  . 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 GERD 11/05/2007  . CELIAC DISEASE 11/05/2007  . HYPERTRIGLYCERIDEMIA 11/04/2007  . ALLERGIC RHINITIS, SEASONAL 11/04/2007  . HEMATOCHEZIA 11/04/2007  . ABDOMINAL PAIN, EPIGASTRIC 11/04/2007  . EARLY SATIETY 11/04/2007  . NONSPEC ELEVATION OF LEVELS OF TRANSAMINASE/LDH 11/04/2007   Ailene Ravel, OTR/L,CBIS  [phone]  11/16/2016, 10:59 AM  [facility] [address] [place], [place], [address] Phone: [phone]   Fax:  [phone]  Name: Jordan Weaver MRN: [id number] Date of Birth: 1955-04-16

## 2016-11-19 ENCOUNTER — Ambulatory Visit (HOSPITAL_COMMUNITY): Payer: BLUE CROSS/BLUE SHIELD

## 2016-11-19 ENCOUNTER — Encounter (HOSPITAL_COMMUNITY): Payer: Self-pay

## 2016-11-19 DIAGNOSIS — R29898 Other symptoms and signs involving the musculoskeletal system: Secondary | ICD-10-CM | POA: Diagnosis not present

## 2016-11-19 DIAGNOSIS — M25612 Stiffness of left shoulder, not elsewhere classified: Secondary | ICD-10-CM

## 2016-11-19 DIAGNOSIS — M25512 Pain in left shoulder: Secondary | ICD-10-CM

## 2016-11-19 NOTE — Therapy (Signed)
[facility] [place], [place], [address] Phone: [phone]   Fax:  [phone]  Occupational Therapy Treatment  Patient Details  Name: Jordan Weaver MRN: [id number] Date of Birth: 20-Sep-1955 Referring Provider: Dr. Tania Ade  Encounter Date: 11/19/2016      OT End of Session - 11/19/16 1227    Visit Number 3   Number of Visits 16   Date for OT Re-Evaluation 01/09/17  Mini reassess: 12/12/16   Authorization Type BCBS $0 co pay   Authorization Time Period Visit limit to 30  OT/PT/SLP combined. (2 used this year)   Authorization - Visit Number 5   Authorization - Number of Visits 30   OT Start Time [phone]   OT Stop Time 1030   OT Time Calculation (min) 40 min   Activity Tolerance Patient tolerated treatment well   Behavior During Therapy WFL for tasks assessed/performed      Past Medical History:  Diagnosis Date  . Celiac disease   . GERD (gastroesophageal reflux disease)     Past Surgical History:  Procedure Laterality Date  . BREAST SURGERY     knot removed  . DILATION AND CURETTAGE OF UTERUS     multpile  . SALPINGOOPHORECTOMY Left 1975  . SPINE SURGERY      There were no vitals filed for this visit.      Subjective Assessment - 11/19/16 1023    Subjective  S: I've been working on my shoulder over the weekend.    Currently in Pain? Yes   Pain Score 5    Pain Location Shoulder   Pain Orientation Left   Pain Descriptors / Indicators Aching   Pain Type Acute pain            [facility] OT Assessment - 11/19/16 1023      Assessment   Diagnosis Left shoulder arthroscopy     Precautions   Precautions Shoulder   Type of Shoulder Precautions Standard arthroscopic protocol.  AA/ROM, pulleys until 3 weeks post op (11/19/16), Week 4-5 (8/27-9/3): : Begin A/ROM  Progress to strengthening with A/ROM is full.                   OT Treatments/Exercises (OP) - 11/19/16 1026      Exercises   Exercises Shoulder     Shoulder Exercises: Supine   Protraction PROM;5 reps;AAROM;15 reps   Horizontal ABduction PROM;5 reps;AAROM;15 reps   External Rotation PROM;5 reps;AAROM;15 reps   Internal Rotation PROM;5 reps;AAROM;15 reps   Flexion PROM;5 reps;AAROM;15 reps   ABduction PROM;5 reps;AAROM;15 reps     Shoulder Exercises: Pulleys   Flexion 1 minute   ABduction 1 minute     Shoulder Exercises: ROM/Strengthening   Wall Wash 1'   Other ROM/Strengthening Exercises PVC pipe slide; 12X     Manual Therapy   Manual Therapy Myofascial release   Manual therapy comments Manual therapy completed prior to exercises.   Myofascial Release Myofascial release and manual stretching completed to left upper arm, trapezius, and scapularis region to decrease fascial restrictions and increase joint mobility in a pain free zone.                    OT Short Term Goals - 11/16/16 1024      OT SHORT TERM GOAL #1   Title Patient will be educated and independent with HEP to increase functional use of LUE during daily tasks.    Time 4  Period Weeks   Status On-going     OT SHORT TERM GOAL #2   Title Patient will increase P/ROM to WNL to increase ability to completing dressing tasks with less difficulty.    Time 4   Period Weeks   Status On-going     OT SHORT TERM GOAL #3   Title Patient will increase LUE strenght to 3+/5 to increase ability to work towards completing activities at shoulder level with less difficulty.    Time 4   Period Weeks   Status On-going     OT SHORT TERM GOAL #4   Title Patient will decrease pain in LUE during daily tasks to 3/10 or less.   Time 4   Period Weeks   Status On-going     OT SHORT TERM GOAL #5   Title Patient will decrease fascial restrictions to mod amount in LUE to increase functional mobility needed to complete reaching tasks.    Time 4   Period Weeks   Status On-going           OT Long Term Goals - 11/16/16 1025      OT LONG  TERM GOAL #1   Title Patient will return to highest level of independence while using LUE as non-dominant extremity for all daily tasks.    Time 8   Period Weeks   Status On-going     OT LONG TERM GOAL #2   Title Patient will increase A/ROM of LUE to WNL to increase ability to complete overhead reaching tasks with less difficulty.    Time 8   Period Weeks   Status On-going     OT LONG TERM GOAL #3   Title Patient will increase LUE strength to 4+/5 in order to return to normal leisure activities such as gardening.    Time 8   Period Weeks   Status On-going     OT LONG TERM GOAL #4   Title Patient will decrease pain level in LUE during daily tasks to 2/10 or less.    Time 8   Period Weeks   Status On-going     OT LONG TERM GOAL #5   Title Patient will decrease fascial restrictions to min amount or less in LUE in order to hook and unhook bra from behind with less difficulty as well as fix her hair.   Time 8   Period Weeks   Status On-going               Plan - 11/19/16 1227    Clinical Impression Statement A: Patient completed pulleys, proximal shoulder strengthening, and increased repetitions with AA/ROM exercises. patient is able to achieve full or close to full range of motion during exercises. Mild soreness noted at end stretch. VC for form and technique.   Plan P: Add therapy ball circles.      Patient will benefit from skilled therapeutic intervention in order to improve the following deficits and impairments:  Increased muscle spasms, Decreased strength, Decreased range of motion, Pain, Impaired UE functional use, Increased fascial restricitons  Visit Diagnosis: Other symptoms and signs involving the musculoskeletal system  Acute pain of left shoulder  Stiffness of left shoulder, not elsewhere classified    Problem List Patient Active Problem List   Diagnosis Date Noted  . 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 GERD 11/05/2007  . CELIAC DISEASE 11/05/2007  .  HYPERTRIGLYCERIDEMIA 11/04/2007  . ALLERGIC RHINITIS, SEASONAL 11/04/2007  . HEMATOCHEZIA 11/04/2007  . ABDOMINAL PAIN, EPIGASTRIC 11/04/2007  .  EARLY SATIETY 11/04/2007  . NONSPEC ELEVATION OF LEVELS OF TRANSAMINASE/LDH 11/04/2007     Ailene Ravel, OTR/L,CBIS  [phone]  11/19/2016, 12:29 PM  [facility] [address] [place], [place], [address] Phone: [phone]   Fax:  [phone]  Name: Jordan Weaver MRN: [id number] Date of Birth: 1955-11-17

## 2016-11-21 ENCOUNTER — Ambulatory Visit (HOSPITAL_COMMUNITY): Payer: BLUE CROSS/BLUE SHIELD

## 2016-11-21 ENCOUNTER — Encounter (HOSPITAL_COMMUNITY): Payer: Self-pay

## 2016-11-21 DIAGNOSIS — R29898 Other symptoms and signs involving the musculoskeletal system: Secondary | ICD-10-CM | POA: Diagnosis not present

## 2016-11-21 DIAGNOSIS — M25512 Pain in left shoulder: Secondary | ICD-10-CM

## 2016-11-21 DIAGNOSIS — M25612 Stiffness of left shoulder, not elsewhere classified: Secondary | ICD-10-CM

## 2016-11-21 NOTE — Therapy (Signed)
[place], [place], [address] Phone: [phone]   Fax:  [phone]  Occupational Therapy Treatment  Patient Details  Name: Jordan Weaver MRN: [id number] Date of Birth: Feb 05, 1956 Referring Provider: Dr. Tania Ade  Encounter Date: 11/21/2016      OT End of Session - 11/21/16 1030    Visit Number 4   Number of Visits 16   Date for OT Re-Evaluation 01/09/17  Mini reassess: 12/12/16   Authorization Type BCBS $0 co pay   Authorization Time Period Visit limit to 30  OT/PT/SLP combined. (2 used this year)   Authorization - Visit Number 6   Authorization - Number of Visits 30   OT Start Time [phone]   OT Stop Time 1030   OT Time Calculation (min) 40 min   Activity Tolerance Patient tolerated treatment well   Behavior During Therapy WFL for tasks assessed/performed      Past Medical History:  Diagnosis Date  . Celiac disease   . GERD (gastroesophageal reflux disease)     Past Surgical History:  Procedure Laterality Date  . BREAST SURGERY     knot removed  . DILATION AND CURETTAGE OF UTERUS     multpile  . SALPINGOOPHORECTOMY Left 1975  . SPINE SURGERY      There were no vitals filed for this visit.      Subjective Assessment - 11/21/16 1016    Subjective  S: I was in a lot of pain after last session. Not right away, but after I woke up from my nap it was hurting.    Currently in Pain? Yes   Pain Score 6    Pain Location Shoulder   Pain Orientation Left   Pain Descriptors / Indicators Aching;Sore   Pain Type Acute pain   Pain Radiating Towards None   Pain Onset In the past 7 days   Pain Frequency Constant   Aggravating Factors  New exercises   Pain Relieving Factors ice and pain meds   Effect of Pain on Daily Activities mod-max effect   Multiple Pain Sites No            [facility] OT Assessment - 11/21/16 1017      Assessment   Diagnosis Left shoulder arthroscopy     Precautions   Precautions Shoulder   Type of Shoulder Precautions Standard arthroscopic protocol.  AA/ROM, pulleys until 3 weeks post op (11/19/16), Week 4-5 (8/27-9/3): : Begin A/ROM  Progress to strengthening with A/ROM is full.                   OT Treatments/Exercises (OP) - 11/21/16 1017      Exercises   Exercises Shoulder     Shoulder Exercises: Supine   Protraction PROM;5 reps;AAROM;15 reps   Horizontal ABduction PROM;5 reps;AAROM;15 reps   External Rotation PROM;5 reps;AAROM;15 reps   Internal Rotation PROM;5 reps;AAROM;15 reps   Flexion PROM;5 reps;AAROM;15 reps   ABduction PROM;5 reps;AAROM;15 reps     Shoulder Exercises: Standing   Protraction AAROM;12 reps   Horizontal ABduction AAROM;12 reps   External Rotation AAROM;12 reps   Internal Rotation AAROM;12 reps   Flexion AAROM;12 reps   ABduction AAROM;12 reps     Shoulder Exercises: Pulleys   Flexion 1 minute   ABduction 1 minute     Shoulder Exercises: ROM/Strengthening   Wall Wash 1'   Proximal Shoulder Strengthening, Supine 12X no rest breaks   Proximal Shoulder Strengthening, Seated 12X  no rest breaks     Manual Therapy   Manual Therapy Myofascial release   Manual therapy comments Manual therapy completed prior to exercises.   Myofascial Release Myofascial release and manual stretching completed to left upper arm, trapezius, and scapularis region to decrease fascial restrictions and increase joint mobility in a pain free zone.                    OT Short Term Goals - 11/16/16 1024      OT SHORT TERM GOAL #1   Title Patient will be educated and independent with HEP to increase functional use of LUE during daily tasks.    Time 4   Period Weeks   Status On-going     OT SHORT TERM GOAL #2   Title Patient will increase P/ROM to WNL to increase ability to completing dressing tasks with less difficulty.    Time 4   Period Weeks   Status On-going     OT SHORT TERM GOAL #3   Title Patient will  increase LUE strenght to 3+/5 to increase ability to work towards completing activities at shoulder level with less difficulty.    Time 4   Period Weeks   Status On-going     OT SHORT TERM GOAL #4   Title Patient will decrease pain in LUE during daily tasks to 3/10 or less.   Time 4   Period Weeks   Status On-going     OT SHORT TERM GOAL #5   Title Patient will decrease fascial restrictions to mod amount in LUE to increase functional mobility needed to complete reaching tasks.    Time 4   Period Weeks   Status On-going           OT Long Term Goals - 11/16/16 1025      OT LONG TERM GOAL #1   Title Patient will return to highest level of independence while using LUE as non-dominant extremity for all daily tasks.    Time 8   Period Weeks   Status On-going     OT LONG TERM GOAL #2   Title Patient will increase A/ROM of LUE to WNL to increase ability to complete overhead reaching tasks with less difficulty.    Time 8   Period Weeks   Status On-going     OT LONG TERM GOAL #3   Title Patient will increase LUE strength to 4+/5 in order to return to normal leisure activities such as gardening.    Time 8   Period Weeks   Status On-going     OT LONG TERM GOAL #4   Title Patient will decrease pain level in LUE during daily tasks to 2/10 or less.    Time 8   Period Weeks   Status On-going     OT LONG TERM GOAL #5   Title Patient will decrease fascial restrictions to min amount or less in LUE in order to hook and unhook bra from behind with less difficulty as well as fix her hair.   Time 8   Period Weeks   Status On-going               Plan - 11/21/16 1031    Clinical Impression Statement A: did not add therapy ball circles as patient reports pain from last session. Monitored patient level during all exercises. VC as needed for form and technique.   Plan P: Add therapy ball circles.  Patient will benefit from skilled therapeutic intervention in order to  improve the following deficits and impairments:  Increased muscle spasms, Decreased strength, Decreased range of motion, Pain, Impaired UE functional use, Increased fascial restricitons  Visit Diagnosis: Other symptoms and signs involving the musculoskeletal system  Acute pain of left shoulder  Stiffness of left shoulder, not elsewhere classified    Problem List Patient Active Problem List   Diagnosis Date Noted  . 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 GERD 11/05/2007  . CELIAC DISEASE 11/05/2007  . HYPERTRIGLYCERIDEMIA 11/04/2007  . ALLERGIC RHINITIS, SEASONAL 11/04/2007  . HEMATOCHEZIA 11/04/2007  . ABDOMINAL PAIN, EPIGASTRIC 11/04/2007  . EARLY SATIETY 11/04/2007  . NONSPEC ELEVATION OF LEVELS OF TRANSAMINASE/LDH 11/04/2007   Ailene Ravel, OTR/L,CBIS  [phone]  11/21/2016, 10:33 AM  [facility] [address] [place], [place], [address] Phone: [phone]   Fax:  [phone]  Name: TOTIANA EVERSON MRN: [id number] Date of Birth: [birth year]

## 2016-11-26 ENCOUNTER — Ambulatory Visit (HOSPITAL_COMMUNITY): Payer: BLUE CROSS/BLUE SHIELD

## 2016-11-28 ENCOUNTER — Encounter (HOSPITAL_COMMUNITY): Payer: Self-pay

## 2016-11-28 ENCOUNTER — Ambulatory Visit (HOSPITAL_COMMUNITY): Payer: BLUE CROSS/BLUE SHIELD

## 2016-11-28 DIAGNOSIS — M25512 Pain in left shoulder: Secondary | ICD-10-CM

## 2016-11-28 DIAGNOSIS — R29898 Other symptoms and signs involving the musculoskeletal system: Secondary | ICD-10-CM | POA: Diagnosis not present

## 2016-11-28 DIAGNOSIS — M25612 Stiffness of left shoulder, not elsewhere classified: Secondary | ICD-10-CM

## 2016-11-28 NOTE — Therapy (Signed)
[place], [place], [address] Phone: [phone]   Fax:  [phone]  Occupational Therapy Treatment  Patient Details  Name: Jordan Weaver MRN: [id number] Date of Birth: 08-28-1955 Referring Provider: Dr. Tania Ade  Encounter Date: 11/28/2016      OT End of Session - 11/28/16 1032    Visit Number 5   Number of Visits 16   Date for OT Re-Evaluation 01/09/17  Mini reassess: 12/12/16   Authorization Type [employer] $0 co pay   Authorization Time Period Visit limit to 30  OT/PT/SLP combined. (2 used this year)   Authorization - Visit Number 7   Authorization - Number of Visits 30   OT Start Time [phone]   OT Stop Time 1030   OT Time Calculation (min) 40 min   Activity Tolerance Patient tolerated treatment well   Behavior During Therapy WFL for tasks assessed/performed      Past Medical History:  Diagnosis Date  . Celiac disease   . GERD (gastroesophageal reflux disease)     Past Surgical History:  Procedure Laterality Date  . BREAST SURGERY     knot removed  . DILATION AND CURETTAGE OF UTERUS     multpile  . SALPINGOOPHORECTOMY Left 1975  . SPINE SURGERY      There were no vitals filed for this visit.          [facility] OT Assessment - 11/28/16 1010      Assessment   Diagnosis Left shoulder arthroscopy     Precautions   Precautions Shoulder   Type of Shoulder Precautions Standard arthroscopic protocol.  AA/ROM, pulleys until 3 weeks post op (11/19/16), Week 4-5 (8/27-9/3): : Begin A/ROM  Progress to strengthening with A/ROM is full.                   OT Treatments/Exercises (OP) - 11/28/16 1010      Exercises   Exercises Shoulder     Shoulder Exercises: Supine   Protraction PROM;5 reps;AROM;12 reps   Horizontal ABduction PROM;5 reps;AROM;12 reps   External Rotation PROM;5 reps;AROM;12 reps   Internal Rotation PROM;5 reps;AROM;12 reps   Flexion PROM;5 reps;AROM;12 reps   ABduction  PROM;5 reps;AROM;12 reps     Shoulder Exercises: Standing   Protraction AROM;12 reps   Horizontal ABduction AROM;12 reps   External Rotation AROM;12 reps   Internal Rotation AROM;12 reps   Flexion AROM;12 reps   ABduction AROM;12 reps     Shoulder Exercises: Therapy Ball   Flexion 5 reps   ABduction 5 reps     Shoulder Exercises: ROM/Strengthening   X to V Arms 10X   Proximal Shoulder Strengthening, Supine 12X no rest breaks     Manual Therapy   Manual Therapy Myofascial release   Manual therapy comments Manual therapy completed prior to exercises.   Myofascial Release Myofascial release and manual stretching completed to left upper arm, trapezius, and scapularis region to decrease fascial restrictions and increase joint mobility in a pain free zone.                  OT Education - 11/28/16 1026    Education provided Yes   Education Details A/ROM shoulder exercises   Person(s) Educated Patient   Methods Explanation;Demonstration;Handout   Comprehension Verbalized understanding;Returned demonstration          OT Short Term Goals - 11/16/16 1024      OT SHORT TERM GOAL #1   Title  Patient will be educated and independent with HEP to increase functional use of LUE during daily tasks.    Time 4   Period Weeks   Status On-going     OT SHORT TERM GOAL #2   Title Patient will increase P/ROM to WNL to increase ability to completing dressing tasks with less difficulty.    Time 4   Period Weeks   Status On-going     OT SHORT TERM GOAL #3   Title Patient will increase LUE strenght to 3+/5 to increase ability to work towards completing activities at shoulder level with less difficulty.    Time 4   Period Weeks   Status On-going     OT SHORT TERM GOAL #4   Title Patient will decrease pain in LUE during daily tasks to 3/10 or less.   Time 4   Period Weeks   Status On-going     OT SHORT TERM GOAL #5   Title Patient will decrease fascial restrictions to mod  amount in LUE to increase functional mobility needed to complete reaching tasks.    Time 4   Period Weeks   Status On-going           OT Long Term Goals - 11/16/16 1025      OT LONG TERM GOAL #1   Title Patient will return to highest level of independence while using LUE as non-dominant extremity for all daily tasks.    Time 8   Period Weeks   Status On-going     OT LONG TERM GOAL #2   Title Patient will increase A/ROM of LUE to WNL to increase ability to complete overhead reaching tasks with less difficulty.    Time 8   Period Weeks   Status On-going     OT LONG TERM GOAL #3   Title Patient will increase LUE strength to 4+/5 in order to return to normal leisure activities such as gardening.    Time 8   Period Weeks   Status On-going     OT LONG TERM GOAL #4   Title Patient will decrease pain level in LUE during daily tasks to 2/10 or less.    Time 8   Period Weeks   Status On-going     OT LONG TERM GOAL #5   Title Patient will decrease fascial restrictions to min amount or less in LUE in order to hook and unhook bra from behind with less difficulty as well as fix her hair.   Time 8   Period Weeks   Status On-going               Plan - 11/28/16 1032    Clinical Impression Statement A: Added therapy balls circles, A/ROM shoulder exercises, and X to V arms. Updated HEP. VC for form and technique as needed. Patient reports some tightness in her upper trapezius region when completing abduction supine.    Plan P: Follow up on updated HEP. Add overhead lacing.      Patient will benefit from skilled therapeutic intervention in order to improve the following deficits and impairments:  Increased muscle spasms, Decreased strength, Decreased range of motion, Pain, Impaired UE functional use, Increased fascial restricitons  Visit Diagnosis: Other symptoms and signs involving the musculoskeletal system  Acute pain of left shoulder  Stiffness of left shoulder, not  elsewhere classified    Problem List Patient Active Problem List   Diagnosis Date Noted  . 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 GERD 11/05/2007  .  CELIAC DISEASE 11/05/2007  . HYPERTRIGLYCERIDEMIA 11/04/2007  . ALLERGIC RHINITIS, SEASONAL 11/04/2007  . HEMATOCHEZIA 11/04/2007  . ABDOMINAL PAIN, EPIGASTRIC 11/04/2007  . EARLY SATIETY 11/04/2007  . NONSPEC ELEVATION OF LEVELS OF TRANSAMINASE/LDH 11/04/2007   Ailene Ravel, OTR/L,CBIS  [phone]  11/28/2016, 10:34 AM  [facility] [address] [place], [place], [address] Phone: [phone]   Fax:  [phone]  Name: Jordan Weaver MRN: [id number] Date of Birth: 14-Sep-1955

## 2016-11-28 NOTE — Patient Instructions (Signed)
Repeat all exercises 10-15 times, 1-2 times per day.  1) Shoulder Protraction    Begin with elbows by your side, slowly "punch" straight out in front of you.      2) Shoulder Flexion  Standing:         Begin with arms at your side with thumbs pointed up, slowly raise both arms up and forward towards overhead.       3) Horizontal abduction/adduction   Standing:           Begin with arms straight out in front of you, bring out to the side in at "T" shape. Keep arms straight entire time.        4) Internal & External Rotation    *No band* -Stand with elbows at the side and elbows bent 90 degrees. Move your forearms away from your body, then bring back inward toward the body.     5) Shoulder Abduction   Standing:       Lying on your back begin with your arms flat on the table next to your side. Slowly move your arms out to the side so that they go overhead, in a jumping jack or snow angel movement.    6) X to V arms (cheerleader move):  Begin with arms straight down, crossed in front of body in an "X". Keeping arms crossed, lift arms straight up overhead. Then spread arms apart into a "V" shape.  Bring back together into x and lower down to starting position.    

## 2016-11-30 ENCOUNTER — Ambulatory Visit (HOSPITAL_COMMUNITY): Payer: BLUE CROSS/BLUE SHIELD

## 2016-11-30 ENCOUNTER — Encounter (HOSPITAL_COMMUNITY): Payer: Self-pay

## 2016-11-30 DIAGNOSIS — R29898 Other symptoms and signs involving the musculoskeletal system: Secondary | ICD-10-CM | POA: Diagnosis not present

## 2016-11-30 DIAGNOSIS — M25512 Pain in left shoulder: Secondary | ICD-10-CM

## 2016-11-30 DIAGNOSIS — M25612 Stiffness of left shoulder, not elsewhere classified: Secondary | ICD-10-CM

## 2016-11-30 NOTE — Therapy (Signed)
[place], [place], [address] Phone: [phone]   Fax:  [phone]  Occupational Therapy Treatment  Patient Details  Name: Jordan Weaver MRN: [id number] Date of Birth: 04-11-1955 Referring Provider: Dr. Tania Ade  Encounter Date: 11/30/2016      OT End of Session - 11/30/16 1008    Visit Number 6   Number of Visits 16   Date for OT Re-Evaluation 01/09/17  Mini reassess: 12/12/16   Authorization Type [employer] $0 co pay   Authorization Time Period Visit limit to 30  OT/PT/SLP combined. (2 used this year)   Authorization - Visit Number 8   Authorization - Number of Visits 30   OT Start Time [phone]   OT Stop Time 1030   OT Time Calculation (min) 43 min   Activity Tolerance Patient tolerated treatment well   Behavior During Therapy WFL for tasks assessed/performed      Past Medical History:  Diagnosis Date  . Celiac disease   . GERD (gastroesophageal reflux disease)     Past Surgical History:  Procedure Laterality Date  . BREAST SURGERY     knot removed  . DILATION AND CURETTAGE OF UTERUS     multpile  . SALPINGOOPHORECTOMY Left 1975  . SPINE SURGERY      There were no vitals filed for this visit.      Subjective Assessment - 11/30/16 0949    Subjective  S: I have to be careful about certain ways I move my arm because I can feel it.    Currently in Pain? Yes   Pain Score 4    Pain Location Shoulder   Pain Orientation Left   Pain Descriptors / Indicators Aching;Sore   Pain Type Acute pain   Pain Radiating Towards Radiates up neck   Pain Onset In the past 7 days   Pain Frequency Constant   Aggravating Factors  New exercises   Pain Relieving Factors ice    Effect of Pain on Daily Activities mod-max effect   Multiple Pain Sites No                      OT Treatments/Exercises (OP) - 11/30/16 1006      Exercises   Exercises Shoulder     Shoulder Exercises: Supine   Protraction PROM;5 reps;AROM;15 reps   Horizontal ABduction PROM;5 reps;AROM;15 reps   External Rotation PROM;5 reps;AROM;15 reps  abducted   Internal Rotation PROM;5 reps;AROM;15 reps  abducted   Flexion PROM;5 reps;AROM;15 reps   ABduction PROM;5 reps;AROM;15 reps     Shoulder Exercises: Standing   Protraction AROM;15 reps   Horizontal ABduction AROM;15 reps   External Rotation AROM;15 reps  abducted   Internal Rotation AROM;15 reps  abducted   Flexion AROM;15 reps   ABduction AROM;15 reps   Extension Theraband;12 reps   Theraband Level (Shoulder Extension) Level 2 (Red)   Row Theraband;12 reps   Theraband Level (Shoulder Row) Level 2 (Red)   Retraction Theraband;12 reps   Theraband Level (Shoulder Retraction) Level 2 (Red)     Shoulder Exercises: ROM/Strengthening   UBE (Upper Arm Bike) Level 1 2' forward 2' reverse   Over Head Lace 1'   Proximal Shoulder Strengthening, Supine 15X no rest breaks     Manual Therapy   Manual Therapy Myofascial release   Manual therapy comments Manual therapy completed prior to exercises.   Myofascial Release Myofascial release and manual stretching completed to left  upper arm, trapezius, and scapularis region to decrease fascial restrictions and increase joint mobility in a pain free zone.                    OT Short Term Goals - 11/16/16 1024      OT SHORT TERM GOAL #1   Title Patient will be educated and independent with HEP to increase functional use of LUE during daily tasks.    Time 4   Period Weeks   Status On-going     OT SHORT TERM GOAL #2   Title Patient will increase P/ROM to WNL to increase ability to completing dressing tasks with less difficulty.    Time 4   Period Weeks   Status On-going     OT SHORT TERM GOAL #3   Title Patient will increase LUE strenght to 3+/5 to increase ability to work towards completing activities at shoulder level with less difficulty.    Time 4   Period Weeks   Status On-going      OT SHORT TERM GOAL #4   Title Patient will decrease pain in LUE during daily tasks to 3/10 or less.   Time 4   Period Weeks   Status On-going     OT SHORT TERM GOAL #5   Title Patient will decrease fascial restrictions to mod amount in LUE to increase functional mobility needed to complete reaching tasks.    Time 4   Period Weeks   Status On-going           OT Long Term Goals - 11/16/16 1025      OT LONG TERM GOAL #1   Title Patient will return to highest level of independence while using LUE as non-dominant extremity for all daily tasks.    Time 8   Period Weeks   Status On-going     OT LONG TERM GOAL #2   Title Patient will increase A/ROM of LUE to WNL to increase ability to complete overhead reaching tasks with less difficulty.    Time 8   Period Weeks   Status On-going     OT LONG TERM GOAL #3   Title Patient will increase LUE strength to 4+/5 in order to return to normal leisure activities such as gardening.    Time 8   Period Weeks   Status On-going     OT LONG TERM GOAL #4   Title Patient will decrease pain level in LUE during daily tasks to 2/10 or less.    Time 8   Period Weeks   Status On-going     OT LONG TERM GOAL #5   Title Patient will decrease fascial restrictions to min amount or less in LUE in order to hook and unhook bra from behind with less difficulty as well as fix her hair.   Time 8   Period Weeks   Status On-going               Plan - 11/30/16 1028    Clinical Impression Statement A: Increased repetitions to 15 this session. Patient with full passive ROM noted this session. No pain noted during exercises. Pt require VC for form and technique as needed.    Plan P: Continue with A/ROM and progress to strengthening as able to.      Patient will benefit from skilled therapeutic intervention in order to improve the following deficits and impairments:  Increased muscle spasms, Decreased strength, Decreased range of motion, Pain,  Impaired  UE functional use, Increased fascial restricitons  Visit Diagnosis: Other symptoms and signs involving the musculoskeletal system  Acute pain of left shoulder  Stiffness of left shoulder, not elsewhere classified    Problem List Patient Active Problem List   Diagnosis Date Noted  . 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 GERD 11/05/2007  . CELIAC DISEASE 11/05/2007  . HYPERTRIGLYCERIDEMIA 11/04/2007  . ALLERGIC RHINITIS, SEASONAL 11/04/2007  . HEMATOCHEZIA 11/04/2007  . ABDOMINAL PAIN, EPIGASTRIC 11/04/2007  . EARLY SATIETY 11/04/2007  . NONSPEC ELEVATION OF LEVELS OF TRANSAMINASE/LDH 11/04/2007   Ailene Ravel, OTR/L,CBIS  [phone]  11/30/2016, 10:33 AM  [facility] [address] [place], [place], [address] Phone: [phone]   Fax:  [phone]  Name: Jordan Weaver MRN: [id number] Date of Birth: Jul 27, 1955

## 2016-12-04 ENCOUNTER — Ambulatory Visit (HOSPITAL_COMMUNITY): Payer: BLUE CROSS/BLUE SHIELD | Attending: Family Medicine

## 2016-12-04 ENCOUNTER — Encounter (HOSPITAL_COMMUNITY): Payer: Self-pay

## 2016-12-04 DIAGNOSIS — R29898 Other symptoms and signs involving the musculoskeletal system: Secondary | ICD-10-CM | POA: Diagnosis not present

## 2016-12-04 DIAGNOSIS — M25512 Pain in left shoulder: Secondary | ICD-10-CM | POA: Diagnosis present

## 2016-12-04 DIAGNOSIS — M25612 Stiffness of left shoulder, not elsewhere classified: Secondary | ICD-10-CM | POA: Diagnosis present

## 2016-12-04 NOTE — Therapy (Signed)
[facility] [place], [place], [address] Phone: [phone]   Fax:  [phone]  Occupational Therapy Treatment  Patient Details  Name: Jordan Weaver MRN: [id number] Date of Birth: March 30, 1956 Referring Provider: Dr. Tania Ade  Encounter Date: 12/04/2016      OT End of Session - 12/04/16 0937    Visit Number 7   Number of Visits 16   Date for OT Re-Evaluation 01/09/17  Mini reassess: 12/12/16   Authorization Type BCBS $0 co pay   Authorization Time Period Visit limit to 30  OT/PT/SLP combined. (2 used this year)   Authorization - Visit Number 9   Authorization - Number of Visits 30   OT Start Time [phone]   OT Stop Time 0945   OT Time Calculation (min) 42 min   Activity Tolerance Patient tolerated treatment well   Behavior During Therapy WFL for tasks assessed/performed      Past Medical History:  Diagnosis Date  . Celiac disease   . GERD (gastroesophageal reflux disease)     Past Surgical History:  Procedure Laterality Date  . BREAST SURGERY     knot removed  . DILATION AND CURETTAGE OF UTERUS     multpile  . SALPINGOOPHORECTOMY Left 1975  . SPINE SURGERY      There were no vitals filed for this visit.      Subjective Assessment - 12/04/16 0917    Subjective  S: I may have overdid it this weekend. It's been sore since Saturday.   Currently in Pain? Yes   Pain Score 5    Pain Location Shoulder   Pain Orientation Left   Pain Descriptors / Indicators Sore   Pain Type Acute pain            [facility] OT Assessment - 12/04/16 0917      Assessment   Diagnosis Left shoulder arthroscopy     Precautions   Precautions Shoulder   Type of Shoulder Precautions Standard arthroscopic protocol.  AA/ROM, pulleys until 3 weeks post op (11/19/16), Week 4-5 (8/27-9/3): : Begin A/ROM  Progress to strengthening with A/ROM is full.                   OT Treatments/Exercises (OP) - 12/04/16 8185       Exercises   Exercises Shoulder     Shoulder Exercises: Supine   Protraction PROM;5 reps;Strengthening;10 reps   Protraction Weight (lbs) 1   Horizontal ABduction PROM;5 reps;Strengthening;10 reps   Horizontal ABduction Weight (lbs) 1   External Rotation PROM;5 reps;Strengthening;10 reps   External Rotation Weight (lbs) 1   Internal Rotation PROM;5 reps;Strengthening;10 reps   Internal Rotation Weight (lbs) 1   Flexion PROM;5 reps;Strengthening;10 reps   Shoulder Flexion Weight (lbs) 1   ABduction PROM;5 reps;Strengthening;10 reps   Shoulder ABduction Weight (lbs) 1     Shoulder Exercises: Standing   Protraction Strengthening;10 reps   Protraction Weight (lbs) 1   Horizontal ABduction Strengthening;10 reps   Horizontal ABduction Weight (lbs) 1   External Rotation Strengthening;10 reps   External Rotation Weight (lbs) 1   Internal Rotation Strengthening;10 reps   Internal Rotation Weight (lbs) 1   Flexion Strengthening;10 reps   Shoulder Flexion Weight (lbs) 1   ABduction Strengthening;10 reps   Shoulder ABduction Weight (lbs) 1   Extension Theraband;12 reps   Theraband Level (Shoulder Extension) Level 2 (Red)   Row Theraband;12 reps   Theraband Level (Shoulder Row) Level 2 (Red)  Retraction Theraband;12 reps   Theraband Level (Shoulder Retraction) Level 2 (Red)     Shoulder Exercises: ROM/Strengthening   UBE (Upper Arm Bike) Level 1 2' forward 2' reverse   Over Head Lace 2'   X to V Arms 10X and 1#   Proximal Shoulder Strengthening, Supine 12X with 1# no rest breaks   Proximal Shoulder Strengthening, Seated 10X with 1# no reast breaks     Manual Therapy   Manual Therapy Myofascial release   Manual therapy comments Manual therapy completed prior to exercises.   Myofascial Release Myofascial release and manual stretching completed to left upper arm, trapezius, and scapularis region to decrease fascial restrictions and increase joint mobility in a pain free zone.                     OT Short Term Goals - 11/16/16 1024      OT SHORT TERM GOAL #1   Title Patient will be educated and independent with HEP to increase functional use of LUE during daily tasks.    Time 4   Period Weeks   Status On-going     OT SHORT TERM GOAL #2   Title Patient will increase P/ROM to WNL to increase ability to completing dressing tasks with less difficulty.    Time 4   Period Weeks   Status On-going     OT SHORT TERM GOAL #3   Title Patient will increase LUE strenght to 3+/5 to increase ability to work towards completing activities at shoulder level with less difficulty.    Time 4   Period Weeks   Status On-going     OT SHORT TERM GOAL #4   Title Patient will decrease pain in LUE during daily tasks to 3/10 or less.   Time 4   Period Weeks   Status On-going     OT SHORT TERM GOAL #5   Title Patient will decrease fascial restrictions to mod amount in LUE to increase functional mobility needed to complete reaching tasks.    Time 4   Period Weeks   Status On-going           OT Long Term Goals - 11/16/16 1025      OT LONG TERM GOAL #1   Title Patient will return to highest level of independence while using LUE as non-dominant extremity for all daily tasks.    Time 8   Period Weeks   Status On-going     OT LONG TERM GOAL #2   Title Patient will increase A/ROM of LUE to WNL to increase ability to complete overhead reaching tasks with less difficulty.    Time 8   Period Weeks   Status On-going     OT LONG TERM GOAL #3   Title Patient will increase LUE strength to 4+/5 in order to return to normal leisure activities such as gardening.    Time 8   Period Weeks   Status On-going     OT LONG TERM GOAL #4   Title Patient will decrease pain level in LUE during daily tasks to 2/10 or less.    Time 8   Period Weeks   Status On-going     OT LONG TERM GOAL #5   Title Patient will decrease fascial restrictions to min amount or less in LUE in  order to hook and unhook bra from behind with less difficulty as well as fix her hair.   Time 8   Period Weeks  Status On-going               Plan - 12/04/16 8069    Clinical Impression Statement A: Progressed to strengthening this session using 1# handweight. patient completed all exercises with VC for form and technique. Minimal fascial restrictions palpated this session.    Plan P: Measure/reassessment for MD appointment on Friday. Continue with strengthening and progress as tolerated.       Patient will benefit from skilled therapeutic intervention in order to improve the following deficits and impairments:  Increased muscle spasms, Decreased strength, Decreased range of motion, Pain, Impaired UE functional use, Increased fascial restricitons  Visit Diagnosis: Other symptoms and signs involving the musculoskeletal system  Acute pain of left shoulder  Stiffness of left shoulder, not elsewhere classified    Problem List Patient Active Problem List   Diagnosis Date Noted  . 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 GERD 11/05/2007  . CELIAC DISEASE 11/05/2007  . HYPERTRIGLYCERIDEMIA 11/04/2007  . ALLERGIC RHINITIS, SEASONAL 11/04/2007  . HEMATOCHEZIA 11/04/2007  . ABDOMINAL PAIN, EPIGASTRIC 11/04/2007  . EARLY SATIETY 11/04/2007  . NONSPEC ELEVATION OF LEVELS OF TRANSAMINASE/LDH 11/04/2007   Ailene Ravel, OTR/L,CBIS  [phone]  12/04/2016, 9:39 AM  [facility] [address] [place], [place], [address] Phone: [phone]   Fax:  [phone]  Name: GEARLDENE FIORENZA MRN: [id number] Date of Birth: 09/14/1955

## 2016-12-05 ENCOUNTER — Ambulatory Visit (INDEPENDENT_AMBULATORY_CARE_PROVIDER_SITE_OTHER): Payer: BLUE CROSS/BLUE SHIELD | Admitting: Obstetrics and Gynecology

## 2016-12-05 ENCOUNTER — Encounter: Payer: Self-pay | Admitting: Obstetrics and Gynecology

## 2016-12-05 VITALS — BP 100/70 | HR 80 | Wt 159.4 lb

## 2016-12-05 DIAGNOSIS — N904 Leukoplakia of vulva: Secondary | ICD-10-CM

## 2016-12-05 DIAGNOSIS — Q525 Fusion of labia: Secondary | ICD-10-CM

## 2016-12-05 DIAGNOSIS — N941 Unspecified dyspareunia: Secondary | ICD-10-CM

## 2016-12-05 MED ORDER — BETAMETHASONE DIPROPIONATE 0.05 % EX CREA: TOPICAL_CREAM | Freq: Two times a day (BID) | CUTANEOUS | 4 refills | Status: DC

## 2016-12-05 NOTE — Progress Notes (Signed)
Patient ID: Jordan Weaver, female   DOB: 1955/05/14, 61 y.o.   MRN: [id number]  [facility] Visit  12/05/16        Patient name: Jordan Weaver MRN [id number]  Date of birth: 08-29-1955  CC & HPI:  Jordan Weaver is a 61 y.o. female presenting today for vaginal irritation, vulvar dystrophy, vaginal bleeding x 2 weeks. Pt voices concern for if she should be evaluated by an urologist. She notes that she has been taking clobetasol as prescribed. She states that she no longer has sexual intercourse due to her symptoms. She reports that she has tried testosterone with no relief of her symptoms. Denies any other symptoms.   ROS:  ROS  +vulvar dystrophy +vaginal irritation +vaginal bleeding +rectal bleeding All systems are negative except as noted in the HPI and PMH.    Pertinent History Reviewed:   Reviewed: Significant for Medical         Past Medical History:  Diagnosis Date  . Celiac disease   . GERD (gastroesophageal reflux disease)                               Surgical Hx:    Past Surgical History:  Procedure Laterality Date  . BREAST SURGERY     knot removed  . DILATION AND CURETTAGE OF UTERUS     multpile  . ROTATOR CUFF REPAIR    . SALPINGOOPHORECTOMY Left 1975  . SPINE SURGERY     Medications: Reviewed & Updated - see associated section                       Current Outpatient Prescriptions:  .  aspirin 81 MG tablet, Take 81 mg by mouth daily., Disp: , Rfl:  .  benazepril (LOTENSIN) 10 MG tablet, , Disp: , Rfl: 3 .  clobetasol cream (TEMOVATE) 0.05 %, APPLY A 1/2 INCH STRIP TO AFFECTED SKIN EACH DAY FOR ONE WEEK, THENTWICE WEEKLY., Disp: 30 g, Rfl: 3 .  DULoxetine (CYMBALTA) 30 MG capsule, Take 30 mg by mouth daily., Disp: , Rfl:  .  furosemide (LASIX) 20 MG tablet, Take 10 mg by mouth every morning., Disp: , Rfl: 3 .  Multiple Vitamin (MULTIVITAMIN) tablet, Take 1 tablet by mouth daily., Disp: , Rfl:  .  omeprazole (PRILOSEC) 20 MG capsule, Take 20 mg by mouth  daily., Disp: , Rfl:  .  spironolactone (ALDACTONE) 25 MG tablet, Take 25 mg by mouth 2 (two) times daily., Disp: , Rfl:    Social History: Reviewed -  reports that she has never smoked. She has never used smokeless tobacco.  Objective Findings:  Vitals: Blood pressure 100/70, pulse 80, weight 159 lb 6.4 oz (72.3 kg).  Physical Examination:  Physical Examination: General appearance - alert, well appearing, and in no distress, oriented to person, place, and time and normal appearing weight Mental status - alert, oriented to person, place, and time, normal mood, behavior, speech, dress, motor activity, and thought processes Eyes - pupils equal and reactive, extraocular eye movements intact  Abd normal VULVA: External genitalia shows labia majora appear nl. Labia minora have been obliterated by vulvar dystrophy process. There is chronic excoriation. 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   Discussion: 1. Discussed with pt risks and benefits of surgical options if condition continues to debilitate   At end of discussion, pt had opportunity to ask  questions and has no further questions at this time.   Specific discussion of surgical options as noted above. Greater than 50% was spent in counseling and coordination of care with the patient.   Total time greater than: 25 minutes.    Assessment & Plan:   A:  1. Vulvar dystrophy, severe  P:  1. Rx 1% hydrocortisone cream to [facility] and testosterone cream. Pt to alternate tx. 2. Offered referral to consult with Dr. Zigmund Daniel at [facility] to discuss patient case.   By signing my name below, I, Soijett Blue, attest that this documentation has been prepared under the direction and in the presence of Jonnie Kind, MD. Electronically Signed: Soijett Blue, ED Scribe. 12/05/16. 11:16 AM.  I personally performed the services described in this documentation, which was SCRIBED in my  presence. The recorded information has been reviewed and considered accurate. It has been edited as necessary during review. Jonnie Kind, MD

## 2016-12-06 ENCOUNTER — Encounter (HOSPITAL_COMMUNITY): Payer: Self-pay | Admitting: Occupational Therapy

## 2016-12-06 ENCOUNTER — Ambulatory Visit (HOSPITAL_COMMUNITY): Payer: BLUE CROSS/BLUE SHIELD | Admitting: Occupational Therapy

## 2016-12-06 DIAGNOSIS — M25612 Stiffness of left shoulder, not elsewhere classified: Secondary | ICD-10-CM

## 2016-12-06 DIAGNOSIS — M25512 Pain in left shoulder: Secondary | ICD-10-CM

## 2016-12-06 DIAGNOSIS — R29898 Other symptoms and signs involving the musculoskeletal system: Secondary | ICD-10-CM | POA: Diagnosis not present

## 2016-12-06 NOTE — Therapy (Signed)
[place], [place], [address] Phone: [phone]   Fax:  [phone]  Occupational Therapy Treatment  Patient Details  Name: Jordan Weaver MRN: [id number] Date of Birth: 03-07-1956 Referring Provider: Dr. Tania Ade  Encounter Date: 12/06/2016      OT End of Session - 12/06/16 1030    Visit Number 8   Number of Visits 16   Date for OT Re-Evaluation 01/09/17   Authorization Type BCBS $0 co pay   Authorization Time Period Visit limit to 30  OT/PT/SLP combined. (2 used this year)   Authorization - Visit Number 10   Authorization - Number of Visits 30   OT Start Time 0945   OT Stop Time 1028   OT Time Calculation (min) 43 min   Activity Tolerance Patient tolerated treatment well   Behavior During Therapy WFL for tasks assessed/performed      Past Medical History:  Diagnosis Date  . Celiac disease   . GERD (gastroesophageal reflux disease)     Past Surgical History:  Procedure Laterality Date  . BREAST SURGERY     knot removed  . DILATION AND CURETTAGE OF UTERUS     multpile  . ROTATOR CUFF REPAIR    . SALPINGOOPHORECTOMY Left 1975  . SPINE SURGERY      There were no vitals filed for this visit.      Subjective Assessment - 12/06/16 0940    Subjective  S: I haven't done a whole lot since Tuesday, just my exercises.    Currently in Pain? Yes   Pain Score 4    Pain Location Shoulder   Pain Orientation Left   Pain Descriptors / Indicators Aching   Pain Type Acute pain   Pain Radiating Towards n/a   Pain Onset In the past 7 days   Pain Frequency Constant   Aggravating Factors  new exercises and movements    Pain Relieving Factors ice   Effect of Pain on Daily Activities mod effect on ADL completion   Multiple Pain Sites No            [facility] OT Assessment - 12/06/16 0940      Assessment   Diagnosis Left shoulder arthroscopy     Precautions   Precautions Shoulder   Type of Shoulder  Precautions Standard arthroscopic protocol.  AA/ROM, pulleys until 3 weeks post op (11/19/16), Week 4-5 (8/27-9/3): : Begin A/ROM  Progress to strengthening with A/ROM is full.      Palpation   Palpation comment Min tenderness and fascial restrictions in left upper arm, trapezius, and scapularis region.     AROM   Overall AROM Comments Assessed seated. IR/er adducted.   AROM Assessment Site Shoulder   Right/Left Shoulder Left   Left Shoulder Flexion 146 Degrees  90 previous   Left Shoulder ABduction 165 Degrees  78 previous   Left Shoulder Internal Rotation 90 Degrees  same as previous   Left Shoulder External Rotation 65 Degrees  60 previous     PROM   Overall PROM Comments Assessed supine. IR/er adducted.   PROM Assessment Site Shoulder   Right/Left Shoulder Left   Left Shoulder Flexion 164 Degrees  125 previous   Left Shoulder ABduction 180 Degrees  90 previous   Left Shoulder Internal Rotation 90 Degrees  same as previous   Left Shoulder External Rotation 85 Degrees  60 previous     Strength   Overall Strength Comments Assessed seated. IR/er  adducted.   Strength Assessment Site Shoulder   Right/Left Shoulder Left   Left Shoulder Flexion 4/5  3-/5 previous   Left Shoulder ABduction 4/5  3-/5 previous   Left Shoulder Internal Rotation 4+/5  3-/5 previous   Left Shoulder External Rotation 4/5  3-/5 previous                  OT Treatments/Exercises (OP) - 12/06/16 0940      Exercises   Exercises Shoulder     Shoulder Exercises: Supine   Protraction PROM;5 reps;Strengthening;10 reps   Protraction Weight (lbs) 1   Horizontal ABduction PROM;5 reps;Strengthening;10 reps   Horizontal ABduction Weight (lbs) 1   External Rotation PROM;5 reps;Strengthening;10 reps   External Rotation Weight (lbs) 1   Internal Rotation PROM;5 reps;Strengthening;10 reps   Internal Rotation Weight (lbs) 1   Flexion PROM;5 reps;Strengthening;10 reps   Shoulder Flexion Weight  (lbs) 1   ABduction PROM;5 reps;Strengthening;10 reps   Shoulder ABduction Weight (lbs) 1     Shoulder Exercises: Standing   Protraction Strengthening;10 reps   Protraction Weight (lbs) 1   Horizontal ABduction Strengthening;10 reps   Horizontal ABduction Weight (lbs) 1   External Rotation Strengthening;10 reps   External Rotation Weight (lbs) 1   Internal Rotation Strengthening;10 reps   Internal Rotation Weight (lbs) 1   Flexion Strengthening;10 reps   Shoulder Flexion Weight (lbs) 1   ABduction Strengthening;10 reps   Shoulder ABduction Weight (lbs) 1   Extension Theraband;12 reps   Theraband Level (Shoulder Extension) Level 2 (Red)   Row Theraband;12 reps   Theraband Level (Shoulder Row) Level 2 (Red)   Retraction Theraband;12 reps   Theraband Level (Shoulder Retraction) Level 2 (Red)     Shoulder Exercises: ROM/Strengthening   Over Head Lace 2', 1# weight   X to V Arms 10X and 1#   Proximal Shoulder Strengthening, Supine 12X with 1# no rest breaks   Proximal Shoulder Strengthening, Seated 10X with 1# no rest breaks   Ball on Wall 1' flexion, 1' abduction     Manual Therapy   Manual Therapy Myofascial release   Manual therapy comments Manual therapy completed prior to exercises.   Myofascial Release Myofascial release and manual stretching completed to left upper arm, trapezius, and scapularis region to decrease fascial restrictions and increase joint mobility in a pain free zone.                  OT Education - 12/06/16 1028    Education provided Yes   Education Details scapular theraband-red   Person(s) Educated Patient   Methods Explanation;Demonstration;Handout   Comprehension Verbalized understanding;Returned demonstration          OT Short Term Goals - 12/06/16 1104      OT SHORT TERM GOAL #1   Title Patient will be educated and independent with HEP to increase functional use of LUE during daily tasks.    Time 4   Period Weeks   Status Achieved      OT SHORT TERM GOAL #2   Title Patient will increase P/ROM to WNL to increase ability to completing dressing tasks with less difficulty.    Time 4   Period Weeks   Status Achieved     OT SHORT TERM GOAL #3   Title Patient will increase LUE strenght to 3+/5 to increase ability to work towards completing activities at shoulder level with less difficulty.    Time 4   Period Weeks   Status  Achieved     OT SHORT TERM GOAL #4   Title Patient will decrease pain in LUE during daily tasks to 3/10 or less.   Time 4   Period Weeks   Status On-going     OT SHORT TERM GOAL #5   Title Patient will decrease fascial restrictions to mod amount in LUE to increase functional mobility needed to complete reaching tasks.    Time 4   Period Weeks   Status Achieved           OT Long Term Goals - 11/16/16 1025      OT LONG TERM GOAL #1   Title Patient will return to highest level of independence while using LUE as non-dominant extremity for all daily tasks.    Time 8   Period Weeks   Status On-going     OT LONG TERM GOAL #2   Title Patient will increase A/ROM of LUE to WNL to increase ability to complete overhead reaching tasks with less difficulty.    Time 8   Period Weeks   Status On-going     OT LONG TERM GOAL #3   Title Patient will increase LUE strength to 4+/5 in order to return to normal leisure activities such as gardening.    Time 8   Period Weeks   Status On-going     OT LONG TERM GOAL #4   Title Patient will decrease pain level in LUE during daily tasks to 2/10 or less.    Time 8   Period Weeks   Status On-going     OT LONG TERM GOAL #5   Title Patient will decrease fascial restrictions to min amount or less in LUE in order to hook and unhook bra from behind with less difficulty as well as fix her hair.   Time 8   Period Weeks   Status On-going               Plan - 12/06/16 1104    Clinical Impression Statement A: Mini-reassessment completed this session,  pt has met 4/5 STGs and is progressing towards remaining goals. Pt continued with strengthening exercises this session, added ball on wall, and added 1# weight to overhead lacing. Minimal fatigue during session, no increased pain at end of session. Occasional verbal cuing for form and technique during exercises.    Plan P: continue with strengthening exercises, add prone hughston exercises if pt able to tolerate   OT Home Exercise Plan 8/15: AA/ROM shoulder exercises   Consulted and Agree with Plan of Care Patient      Patient will benefit from skilled therapeutic intervention in order to improve the following deficits and impairments:  Increased muscle spasms, Decreased strength, Decreased range of motion, Pain, Impaired UE functional use, Increased fascial restricitons  Visit Diagnosis: Other symptoms and signs involving the musculoskeletal system  Acute pain of left shoulder  Stiffness of left shoulder, not elsewhere classified    Problem List Patient Active Problem List   Diagnosis Date Noted  . 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 GERD 11/05/2007  . CELIAC DISEASE 11/05/2007  . HYPERTRIGLYCERIDEMIA 11/04/2007  . ALLERGIC RHINITIS, SEASONAL 11/04/2007  . HEMATOCHEZIA 11/04/2007  . ABDOMINAL PAIN, EPIGASTRIC 11/04/2007  . EARLY SATIETY 11/04/2007  . NONSPEC ELEVATION OF LEVELS OF TRANSAMINASE/LDH 11/04/2007   Guadelupe Sabin, OTR/L  [phone] 12/06/2016, 11:07 AM  [facility] [address] [place], [place], [address] Phone: [phone]   Fax:  [phone]  Name:  Jordan Weaver MRN: [id number] Date of Birth: 1956/02/04

## 2016-12-06 NOTE — Patient Instructions (Signed)
(  Home) Extension: Isometric / Bilateral Arm Retraction - Sitting   Facing anchor, hold hands and elbow at shoulder height, with elbow bent.  Pull arms back to squeeze shoulder blades together. Repeat 10-15 times. 1-3 times/day.   Copyright  VHI. All rights reserved.   (Home) Retraction: Row - Bilateral (Anchor)   Facing anchor, arms reaching forward, pull hands toward stomach, keeping elbows bent and at your sides and pinching shoulder blades together. Repeat 10-15 times. 1-3 times/day.   Copyright  VHI. All rights reserved.   (Clinic) Extension / Flexion (Assist)   Face anchor, pull arms back, keeping elbow straight, and squeze shoulder blades together. Repeat 10-15 times. 1-3 times/day.   Copyright  VHI. All rights reserved.

## 2016-12-07 DIAGNOSIS — Q525 Fusion of labia: Secondary | ICD-10-CM | POA: Insufficient documentation

## 2016-12-10 ENCOUNTER — Telehealth: Payer: Self-pay | Admitting: Obstetrics and Gynecology

## 2016-12-11 ENCOUNTER — Ambulatory Visit (HOSPITAL_COMMUNITY): Payer: BLUE CROSS/BLUE SHIELD

## 2016-12-11 ENCOUNTER — Encounter (HOSPITAL_COMMUNITY): Payer: Self-pay

## 2016-12-11 DIAGNOSIS — R29898 Other symptoms and signs involving the musculoskeletal system: Secondary | ICD-10-CM

## 2016-12-11 DIAGNOSIS — M25512 Pain in left shoulder: Secondary | ICD-10-CM

## 2016-12-11 DIAGNOSIS — M25612 Stiffness of left shoulder, not elsewhere classified: Secondary | ICD-10-CM

## 2016-12-11 NOTE — Therapy (Signed)
[place], [place], [address] Phone: [phone]   Fax:  [phone]  Occupational Therapy Treatment  Patient Details  Name: Jordan Weaver MRN: [id number] Date of Birth: [birth year] Referring Provider: Dr. Tania Ade  Encounter Date: 12/11/2016      OT End of Session - 12/11/16 1001    Visit Number 9   Number of Visits 16   Date for OT Re-Evaluation 01/09/17   Authorization Type BCBS $0 co pay   Authorization Time Period Visit limit to 30  OT/PT/SLP combined. (2 used this year)   Authorization - Visit Number 11   Authorization - Number of Visits 30   OT Start Time [phone]   OT Stop Time 0945   OT Time Calculation (min) 41 min   Activity Tolerance Patient tolerated treatment well   Behavior During Therapy WFL for tasks assessed/performed      Past Medical History:  Diagnosis Date  . Celiac disease   . GERD (gastroesophageal reflux disease)     Past Surgical History:  Procedure Laterality Date  . BREAST SURGERY     knot removed  . DILATION AND CURETTAGE OF UTERUS     multpile  . ROTATOR CUFF REPAIR    . SALPINGOOPHORECTOMY Left 1975  . SPINE SURGERY      There were no vitals filed for this visit.      Subjective Assessment - 12/11/16 0919    Subjective  S: I don't have to wear my sling and I can start decreasing my therapy.    Currently in Pain? Yes   Pain Score 5    Pain Location Shoulder   Pain Orientation Left   Pain Descriptors / Indicators Aching   Pain Type Acute pain            [facility] OT Assessment - 12/11/16 0920      Assessment   Diagnosis Left shoulder arthroscopy     Precautions   Precautions Shoulder   Type of Shoulder Precautions Progress as tolerated                  OT Treatments/Exercises (OP) - 12/11/16 0920      Exercises   Exercises Shoulder     Shoulder Exercises: Supine   Protraction PROM;5 reps;Strengthening;12 reps   Protraction Weight  (lbs) 1   Horizontal ABduction PROM;5 reps;Strengthening;12 reps   Horizontal ABduction Weight (lbs) 1   External Rotation PROM;5 reps;Strengthening;12 reps  abducted   External Rotation Weight (lbs) 1   Internal Rotation PROM;5 reps;Strengthening;12 reps  abducted   Internal Rotation Weight (lbs) 1   Flexion PROM;5 reps;Strengthening;12 reps   Shoulder Flexion Weight (lbs) 1   ABduction PROM;5 reps;Strengthening;12 reps   Shoulder ABduction Weight (lbs) 1     Shoulder Exercises: Prone   Other Prone Exercises Hughston exercises; 1#; 10X     Shoulder Exercises: Standing   Protraction Strengthening;12 reps   Protraction Weight (lbs) 1   Horizontal ABduction Strengthening;12 reps   Horizontal ABduction Weight (lbs) 1   External Rotation Strengthening;12 reps   External Rotation Weight (lbs) 1   Internal Rotation Strengthening;12 reps   Internal Rotation Weight (lbs) 1   Flexion Strengthening;12 reps   Shoulder Flexion Weight (lbs) 1   ABduction Strengthening;12 reps   Shoulder ABduction Weight (lbs) 1     Shoulder Exercises: ROM/Strengthening   UBE (Upper Arm Bike) Level 1 2' forward 2' reverse   Over Head Lace 2', 1#  weight   X to V Arms 12X and 1#   Proximal Shoulder Strengthening, Supine 15X with 1# no rest breaks   Proximal Shoulder Strengthening, Seated 12X with 1# no rest breaks   Ball on Wall 1' flexion, 1' abduction     Manual Therapy   Manual Therapy Myofascial release   Manual therapy comments Manual therapy completed prior to exercises.   Myofascial Release Myofascial release and manual stretching completed to left upper arm, trapezius, and scapularis region to decrease fascial restrictions and increase joint mobility in a pain free zone.                    OT Short Term Goals - 12/06/16 1104      OT SHORT TERM GOAL #1   Title Patient will be educated and independent with HEP to increase functional use of LUE during daily tasks.    Time 4   Period  Weeks   Status Achieved     OT SHORT TERM GOAL #2   Title Patient will increase P/ROM to WNL to increase ability to completing dressing tasks with less difficulty.    Time 4   Period Weeks   Status Achieved     OT SHORT TERM GOAL #3   Title Patient will increase LUE strenght to 3+/5 to increase ability to work towards completing activities at shoulder level with less difficulty.    Time 4   Period Weeks   Status Achieved     OT SHORT TERM GOAL #4   Title Patient will decrease pain in LUE during daily tasks to 3/10 or less.   Time 4   Period Weeks   Status On-going     OT SHORT TERM GOAL #5   Title Patient will decrease fascial restrictions to mod amount in LUE to increase functional mobility needed to complete reaching tasks.    Time 4   Period Weeks   Status Achieved           OT Long Term Goals - 11/16/16 1025      OT LONG TERM GOAL #1   Title Patient will return to highest level of independence while using LUE as non-dominant extremity for all daily tasks.    Time 8   Period Weeks   Status On-going     OT LONG TERM GOAL #2   Title Patient will increase A/ROM of LUE to WNL to increase ability to complete overhead reaching tasks with less difficulty.    Time 8   Period Weeks   Status On-going     OT LONG TERM GOAL #3   Title Patient will increase LUE strength to 4+/5 in order to return to normal leisure activities such as gardening.    Time 8   Period Weeks   Status On-going     OT LONG TERM GOAL #4   Title Patient will decrease pain level in LUE during daily tasks to 2/10 or less.    Time 8   Period Weeks   Status On-going     OT LONG TERM GOAL #5   Title Patient will decrease fascial restrictions to min amount or less in LUE in order to hook and unhook bra from behind with less difficulty as well as fix her hair.   Time 8   Period Weeks   Status On-going               Plan - 12/11/16 1001    Clinical Impression Statement  A: Progressed to 12  repetitions for strengthening exercises and completed hughston exercises with 1# weight. Occassional VC for form and technique.   Plan P: Complete strength for shoulder with theraband.      Patient will benefit from skilled therapeutic intervention in order to improve the following deficits and impairments:  Increased muscle spasms, Decreased strength, Decreased range of motion, Pain, Impaired UE functional use, Increased fascial restricitons  Visit Diagnosis: Acute pain of left shoulder  Stiffness of left shoulder, not elsewhere classified  Other symptoms and signs involving the musculoskeletal system    Problem List Patient Active Problem List   Diagnosis Date Noted  . 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 GERD 11/05/2007  . CELIAC DISEASE 11/05/2007  . HYPERTRIGLYCERIDEMIA 11/04/2007  . ALLERGIC RHINITIS, SEASONAL 11/04/2007  . HEMATOCHEZIA 11/04/2007  . ABDOMINAL PAIN, EPIGASTRIC 11/04/2007  . EARLY SATIETY 11/04/2007  . NONSPEC ELEVATION OF LEVELS OF TRANSAMINASE/LDH 11/04/2007   Ailene Ravel, OTR/L,CBIS  [phone]  12/11/2016, 10:03 AM  [facility] [address] [place], [place], [address] Phone: [phone]   Fax:  [phone]  Name: Jordan Weaver MRN: [id number] Date of Birth: Jul 31, 1955

## 2016-12-13 ENCOUNTER — Ambulatory Visit (HOSPITAL_COMMUNITY): Payer: BLUE CROSS/BLUE SHIELD

## 2016-12-13 DIAGNOSIS — R29898 Other symptoms and signs involving the musculoskeletal system: Secondary | ICD-10-CM | POA: Diagnosis not present

## 2016-12-13 DIAGNOSIS — M25512 Pain in left shoulder: Secondary | ICD-10-CM

## 2016-12-13 DIAGNOSIS — M25612 Stiffness of left shoulder, not elsewhere classified: Secondary | ICD-10-CM

## 2016-12-13 NOTE — Therapy (Signed)
[place], [place], [address] Phone: [phone]   Fax:  [phone]  Occupational Therapy Treatment  Patient Details  Name: Jordan Weaver MRN: [id number] Date of Birth: 1955/05/28 Referring Provider: Dr. Tania Ade  Encounter Date: 12/13/2016      OT End of Session - 12/13/16 1323    Visit Number 10   Number of Visits 16   Date for OT Re-Evaluation 01/09/17   Authorization Type BCBS $0 co pay   Authorization Time Period Visit limit to 30  OT/PT/SLP combined. (2 used this year)   Authorization - Visit Number 12   Authorization - Number of Visits 30   OT Start Time 4801   OT Stop Time 1115   OT Time Calculation (min) 35 min   Activity Tolerance Patient tolerated treatment well   Behavior During Therapy WFL for tasks assessed/performed      Past Medical History:  Diagnosis Date  . Celiac disease   . GERD (gastroesophageal reflux disease)     Past Surgical History:  Procedure Laterality Date  . BREAST SURGERY     knot removed  . DILATION AND CURETTAGE OF UTERUS     multpile  . ROTATOR CUFF REPAIR    . SALPINGOOPHORECTOMY Left 1975  . SPINE SURGERY      There were no vitals filed for this visit.      Subjective Assessment - 12/13/16 1056    Subjective  S: I can tell it's getting better.   Currently in Pain? Yes   Pain Score 3    Pain Location Shoulder   Pain Orientation Left   Pain Descriptors / Indicators Aching   Pain Type Acute pain   Pain Radiating Towards N/A   Pain Onset In the past 7 days   Pain Frequency Constant   Aggravating Factors  Certain movements and use   Pain Relieving Factors ice   Effect of Pain on Daily Activities min effect   Multiple Pain Sites No            [facility] OT Assessment - 12/13/16 1057      Assessment   Diagnosis Left shoulder arthroscopy     Precautions   Precautions Shoulder   Type of Shoulder Precautions Progress as tolerated                   OT Treatments/Exercises (OP) - 12/13/16 1057      Exercises   Exercises Shoulder     Shoulder Exercises: Supine   Protraction PROM;5 reps;Strengthening;15 reps   Protraction Weight (lbs) 1   Horizontal ABduction PROM;5 reps;Strengthening;15 reps   Horizontal ABduction Weight (lbs) 1   External Rotation PROM;5 reps;Strengthening;15 reps  abducted   External Rotation Weight (lbs) 1   Internal Rotation PROM;5 reps;Strengthening;15 reps  abducted   Internal Rotation Weight (lbs) 1   Flexion PROM;5 reps;Strengthening;15 reps   Shoulder Flexion Weight (lbs) 1   ABduction PROM;5 reps;Strengthening;15 reps   Shoulder ABduction Weight (lbs) 1     Shoulder Exercises: Prone   Other Prone Exercises Hughston exercises; 1#; 10X     Shoulder Exercises: Standing   Horizontal ABduction Theraband;12 reps   Theraband Level (Shoulder Horizontal ABduction) Level 2 (Red)   External Rotation Theraband;12 reps   Theraband Level (Shoulder External Rotation) Level 2 (Red)   Internal Rotation Theraband;12 reps   Theraband Level (Shoulder Internal Rotation) Level 2 (Red)   Flexion Theraband;12 reps   Theraband Level (Shoulder  Flexion) Level 2 (Red)   ABduction Theraband;12 reps   Theraband Level (Shoulder ABduction) Level 2 (Red)     Shoulder Exercises: ROM/Strengthening   Proximal Shoulder Strengthening, Supine 15X with 1# no rest breaks     Manual Therapy   Manual Therapy Myofascial release   Manual therapy comments Manual therapy completed prior to exercises.   Myofascial Release Myofascial release and manual stretching completed to left upper arm, trapezius, and scapularis region to decrease fascial restrictions and increase joint mobility in a pain free zone.                  OT Education - 12/13/16 1322    Education provided Yes   Education Details scapular shoulder strengthening   Person(s) Educated Patient   Methods Explanation;Verbal  cues;Handout;Demonstration   Comprehension Returned demonstration;Verbalized understanding          OT Short Term Goals - 12/13/16 1325      OT SHORT TERM GOAL #1   Title Patient will be educated and independent with HEP to increase functional use of LUE during daily tasks.    Time 4   Period Weeks     OT SHORT TERM GOAL #2   Title Patient will increase P/ROM to WNL to increase ability to completing dressing tasks with less difficulty.    Time 4   Period Weeks     OT SHORT TERM GOAL #3   Title Patient will increase LUE strenght to 3+/5 to increase ability to work towards completing activities at shoulder level with less difficulty.    Time 4   Period Weeks     OT SHORT TERM GOAL #4   Title Patient will decrease pain in LUE during daily tasks to 3/10 or less.   Time 4   Period Weeks   Status On-going     OT SHORT TERM GOAL #5   Title Patient will decrease fascial restrictions to mod amount in LUE to increase functional mobility needed to complete reaching tasks.    Time 4   Period Weeks           OT Long Term Goals - 11/16/16 1025      OT LONG TERM GOAL #1   Title Patient will return to highest level of independence while using LUE as non-dominant extremity for all daily tasks.    Time 8   Period Weeks   Status On-going     OT LONG TERM GOAL #2   Title Patient will increase A/ROM of LUE to WNL to increase ability to complete overhead reaching tasks with less difficulty.    Time 8   Period Weeks   Status On-going     OT LONG TERM GOAL #3   Title Patient will increase LUE strength to 4+/5 in order to return to normal leisure activities such as gardening.    Time 8   Period Weeks   Status On-going     OT LONG TERM GOAL #4   Title Patient will decrease pain level in LUE during daily tasks to 2/10 or less.    Time 8   Period Weeks   Status On-going     OT LONG TERM GOAL #5   Title Patient will decrease fascial restrictions to min amount or less in LUE in  order to hook and unhook bra from behind with less difficulty as well as fix her hair.   Time 8   Period Weeks   Status On-going  Plan - 12/13/16 1323    Clinical Impression Statement A: Increased repetitions to 15 and added shoulder strengthening with red band. Pt was given exercises for HEP.  VC for form and technique.    Plan P: Continue with strengthening progress as able to tolerate.       Patient will benefit from skilled therapeutic intervention in order to improve the following deficits and impairments:  Increased muscle spasms, Decreased strength, Decreased range of motion, Pain, Impaired UE functional use, Increased fascial restricitons  Visit Diagnosis: Acute pain of left shoulder  Stiffness of left shoulder, not elsewhere classified  Other symptoms and signs involving the musculoskeletal system    Problem List Patient Active Problem List   Diagnosis Date Noted  . 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 GERD 11/05/2007  . CELIAC DISEASE 11/05/2007  . HYPERTRIGLYCERIDEMIA 11/04/2007  . ALLERGIC RHINITIS, SEASONAL 11/04/2007  . HEMATOCHEZIA 11/04/2007  . ABDOMINAL PAIN, EPIGASTRIC 11/04/2007  . EARLY SATIETY 11/04/2007  . NONSPEC ELEVATION OF LEVELS OF TRANSAMINASE/LDH 11/04/2007   Ailene Ravel, OTR/L,CBIS  [phone]  12/13/2016, 1:27 PM  [facility] [address] [place], [place], [address] Phone: [phone]   Fax:  [phone]  Name: MAKYLIE RIVERE MRN: [id number] Date of Birth: 1955-12-17

## 2016-12-13 NOTE — Patient Instructions (Signed)
Strengthening: Chest Pull - Resisted   Hold Theraband in front of body with hands about shoulder width a part. Pull band a part and back together slowly. Repeat _12-15___ times. Complete ____ set(s) per session.. Repeat __1-2__ session(s) per day.  http://orth.exer.us/926   Copyright  VHI. All rights reserved.   PNF Strengthening: Resisted   Standing with resistive band around each hand, bring right arm up and away, thumb back. Repeat _12-15___ times per set. Do ____ sets per session. Do _1-2___ sessions per day.      Resisted External Rotation: in Neutral - Bilateral   Sit or stand, tubing in both hands, elbows at sides, bent to 90, forearms forward. Pinch shoulder blades together and rotate forearms out. Keep elbows at sides. Repeat _12-15___ times per set. Do ____ sets per session. Do _1-2___ sessions per day.  http://orth.exer.us/966   Copyright  VHI. All rights reserved.   PNF Strengthening: Resisted   Standing, hold resistive band above head. Bring right arm down and out from side. Repeat _12-15___ times per set. Do ____ sets per session. Do _1-2___ sessions per day.  http://orth.exer.us/922   Copyright  VHI. All rights reserved.

## 2016-12-14 ENCOUNTER — Telehealth: Payer: Self-pay | Admitting: Obstetrics and Gynecology

## 2016-12-18 ENCOUNTER — Encounter (HOSPITAL_COMMUNITY): Payer: Self-pay

## 2016-12-18 ENCOUNTER — Ambulatory Visit (HOSPITAL_COMMUNITY): Payer: BLUE CROSS/BLUE SHIELD

## 2016-12-18 DIAGNOSIS — M25512 Pain in left shoulder: Secondary | ICD-10-CM

## 2016-12-18 DIAGNOSIS — R29898 Other symptoms and signs involving the musculoskeletal system: Secondary | ICD-10-CM | POA: Diagnosis not present

## 2016-12-18 DIAGNOSIS — M25612 Stiffness of left shoulder, not elsewhere classified: Secondary | ICD-10-CM

## 2016-12-18 NOTE — Therapy (Signed)
[facility] [place], [place], [address] Phone: [phone]   Fax:  [phone]  Occupational Therapy Treatment  Patient Details  Name: NEFERTARI REBMAN MRN: [id number] Date of Birth: January 16, 1956 Referring Provider: Dr. Tania Ade  Encounter Date: 12/18/2016      OT End of Session - 12/18/16 0944    Visit Number 11   Number of Visits 16   Date for OT Re-Evaluation 01/09/17   Authorization Type BCBS $0 co pay   Authorization Time Period Visit limit to 30  OT/PT/SLP combined. (2 used this year)   Authorization - Visit Number 13   Authorization - Number of Visits 30   OT Start Time [phone]   OT Stop Time 0945   OT Time Calculation (min) 40 min   Activity Tolerance Patient tolerated treatment well   Behavior During Therapy WFL for tasks assessed/performed      Past Medical History:  Diagnosis Date  . Celiac disease   . GERD (gastroesophageal reflux disease)     Past Surgical History:  Procedure Laterality Date  . BREAST SURGERY     knot removed  . DILATION AND CURETTAGE OF UTERUS     multpile  . ROTATOR CUFF REPAIR    . SALPINGOOPHORECTOMY Left 1975  . SPINE SURGERY      There were no vitals filed for this visit.                    OT Treatments/Exercises (OP) - 12/18/16 0932      Exercises   Exercises Shoulder     Shoulder Exercises: Supine   Protraction PROM;5 reps;Strengthening;15 reps   Protraction Weight (lbs) 1   Horizontal ABduction PROM;5 reps;Strengthening;15 reps   Horizontal ABduction Weight (lbs) 1   External Rotation PROM;5 reps;Strengthening;15 reps  abducted   External Rotation Weight (lbs) 1   Internal Rotation PROM;5 reps;Strengthening;15 reps  abducted   Internal Rotation Weight (lbs) 1   Flexion PROM;5 reps;Strengthening;15 reps   Shoulder Flexion Weight (lbs) 1   ABduction PROM;5 reps;Strengthening;15 reps   Shoulder ABduction Weight (lbs) 1     Shoulder  Exercises: Standing   Horizontal ABduction Theraband;12 reps   Theraband Level (Shoulder Horizontal ABduction) Level 2 (Red)   External Rotation Theraband;12 reps   Theraband Level (Shoulder External Rotation) Level 2 (Red)   Internal Rotation Theraband;12 reps   Theraband Level (Shoulder Internal Rotation) Level 2 (Red)   Flexion Theraband;12 reps   Theraband Level (Shoulder Flexion) Level 2 (Red)   ABduction Theraband;12 reps   Theraband Level (Shoulder ABduction) Level 2 (Red)     Shoulder Exercises: ROM/Strengthening   UBE (Upper Arm Bike) Level 2 2' forward 2' reverse   X to V Arms 15X and 1#   Proximal Shoulder Strengthening, Supine 15X with 1# no rest breaks   Proximal Shoulder Strengthening, Seated 15X with 1# no rest breaks   Ball on Wall 1' flexion, 1' abduction     Manual Therapy   Manual Therapy Myofascial release   Manual therapy comments Manual therapy completed prior to exercises.   Myofascial Release Myofascial release and manual stretching completed to left upper arm, trapezius, and scapularis region to decrease fascial restrictions and increase joint mobility in a pain free zone.                    OT Short Term Goals - 12/13/16 1325      OT SHORT TERM GOAL #1  Title Patient will be educated and independent with HEP to increase functional use of LUE during daily tasks.    Time 4   Period Weeks     OT SHORT TERM GOAL #2   Title Patient will increase P/ROM to WNL to increase ability to completing dressing tasks with less difficulty.    Time 4   Period Weeks     OT SHORT TERM GOAL #3   Title Patient will increase LUE strenght to 3+/5 to increase ability to work towards completing activities at shoulder level with less difficulty.    Time 4   Period Weeks     OT SHORT TERM GOAL #4   Title Patient will decrease pain in LUE during daily tasks to 3/10 or less.   Time 4   Period Weeks   Status On-going     OT SHORT TERM GOAL #5   Title Patient  will decrease fascial restrictions to mod amount in LUE to increase functional mobility needed to complete reaching tasks.    Time 4   Period Weeks           OT Long Term Goals - 11/16/16 1025      OT LONG TERM GOAL #1   Title Patient will return to highest level of independence while using LUE as non-dominant extremity for all daily tasks.    Time 8   Period Weeks   Status On-going     OT LONG TERM GOAL #2   Title Patient will increase A/ROM of LUE to WNL to increase ability to complete overhead reaching tasks with less difficulty.    Time 8   Period Weeks   Status On-going     OT LONG TERM GOAL #3   Title Patient will increase LUE strength to 4+/5 in order to return to normal leisure activities such as gardening.    Time 8   Period Weeks   Status On-going     OT LONG TERM GOAL #4   Title Patient will decrease pain level in LUE during daily tasks to 2/10 or less.    Time 8   Period Weeks   Status On-going     OT LONG TERM GOAL #5   Title Patient will decrease fascial restrictions to min amount or less in LUE in order to hook and unhook bra from behind with less difficulty as well as fix her hair.   Time 8   Period Weeks   Status On-going               Plan - 12/18/16 0945    Clinical Impression Statement A: Continued with strengthening this session and did not increase any weight or repetitions as patient reports that she was sore after last session. Min VC needed for form and technique.   Plan P: Add cybex row and press. Continue to work on Hotel manager. Increase to 2# supine.      Patient will benefit from skilled therapeutic intervention in order to improve the following deficits and impairments:  Increased muscle spasms, Decreased strength, Decreased range of motion, Pain, Impaired UE functional use, Increased fascial restricitons  Visit Diagnosis: Acute pain of left shoulder  Stiffness of left shoulder, not elsewhere classified  Other symptoms and  signs involving the musculoskeletal system    Problem List Patient Active Problem List   Diagnosis Date Noted  . 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 GERD 11/05/2007  . CELIAC DISEASE 11/05/2007  . HYPERTRIGLYCERIDEMIA  11/04/2007  . ALLERGIC RHINITIS, SEASONAL 11/04/2007  . HEMATOCHEZIA 11/04/2007  . ABDOMINAL PAIN, EPIGASTRIC 11/04/2007  . EARLY SATIETY 11/04/2007  . NONSPEC ELEVATION OF LEVELS OF TRANSAMINASE/LDH 11/04/2007   Ailene Ravel, OTR/L,CBIS  [phone]  12/18/2016, 9:46 AM  [facility] [address] [place], [place], [address] Phone: [phone]   Fax:  [phone]  Name: TWANA WILEMAN MRN: [id number] Date of Birth: 21-Feb-1956

## 2016-12-19 ENCOUNTER — Other Ambulatory Visit: Payer: Self-pay | Admitting: Obstetrics and Gynecology

## 2016-12-19 ENCOUNTER — Telehealth (HOSPITAL_COMMUNITY): Payer: Self-pay

## 2016-12-19 DIAGNOSIS — Z1231 Encounter for screening mammogram for malignant neoplasm of breast: Secondary | ICD-10-CM

## 2016-12-19 NOTE — Telephone Encounter (Signed)
She is having alot of soreness and want to take some time off from OT.

## 2016-12-20 ENCOUNTER — Ambulatory Visit (HOSPITAL_COMMUNITY): Payer: BLUE CROSS/BLUE SHIELD

## 2016-12-25 ENCOUNTER — Encounter (HOSPITAL_COMMUNITY): Payer: Self-pay

## 2016-12-25 ENCOUNTER — Ambulatory Visit (HOSPITAL_COMMUNITY): Payer: BLUE CROSS/BLUE SHIELD

## 2016-12-25 DIAGNOSIS — R29898 Other symptoms and signs involving the musculoskeletal system: Secondary | ICD-10-CM | POA: Diagnosis not present

## 2016-12-25 DIAGNOSIS — M25512 Pain in left shoulder: Secondary | ICD-10-CM

## 2016-12-25 DIAGNOSIS — M25612 Stiffness of left shoulder, not elsewhere classified: Secondary | ICD-10-CM

## 2016-12-25 NOTE — Therapy (Signed)
[facility] [place], [place], [address] Phone: [phone]   Fax:  [phone]  Occupational Therapy Treatment  Patient Details  Name: ANEEKA BOWDEN MRN: [id number] Date of Birth: [birth year] Referring Provider: Dr. Tania Ade  Encounter Date: 12/25/2016      OT End of Session - 12/25/16 1144    Visit Number 12   Number of Visits 16   Date for OT Re-Evaluation 01/09/17   Authorization Type BCBS $0 co pay   Authorization Time Period Visit limit to 30  OT/PT/SLP combined. (2 used this year)   Authorization - Visit Number 14   Authorization - Number of Visits 30   OT Start Time [phone]   OT Stop Time 1030   OT Time Calculation (min) 42 min   Activity Tolerance Patient tolerated treatment well   Behavior During Therapy WFL for tasks assessed/performed      Past Medical History:  Diagnosis Date  . Celiac disease   . GERD (gastroesophageal reflux disease)     Past Surgical History:  Procedure Laterality Date  . BREAST SURGERY     knot removed  . DILATION AND CURETTAGE OF UTERUS     multpile  . ROTATOR CUFF REPAIR    . SALPINGOOPHORECTOMY Left 1975  . SPINE SURGERY      There were no vitals filed for this visit.      Subjective Assessment - 12/25/16 1008    Subjective  S: I couldn't do therapy last Thursday because i was so sore. I needed a break.   Currently in Pain? Yes   Pain Score 4    Pain Location Shoulder   Pain Orientation Left   Pain Descriptors / Indicators Aching   Pain Type Acute pain   Pain Radiating Towards N/A   Pain Onset In the past 7 days   Pain Frequency Constant   Aggravating Factors  certain movements and use   Pain Relieving Factors ice and rest   Effect of Pain on Daily Activities min effect            [facility] OT Assessment - 12/25/16 1012      Assessment   Diagnosis Left shoulder arthroscopy     Precautions   Precautions Shoulder   Type of Shoulder Precautions Progress  as tolerated                  OT Treatments/Exercises (OP) - 12/25/16 1012      Exercises   Exercises Shoulder     Shoulder Exercises: Supine   Protraction PROM;5 reps;Strengthening;10 reps   Protraction Weight (lbs) 2   Horizontal ABduction PROM;5 reps;Strengthening;10 reps   Horizontal ABduction Weight (lbs) 2   External Rotation PROM;5 reps;Strengthening;10 reps  abducted   External Rotation Weight (lbs) 2   Internal Rotation PROM;5 reps;Strengthening;10 reps  abducted   Internal Rotation Weight (lbs) 2   Flexion PROM;5 reps;Strengthening;10 reps   Shoulder Flexion Weight (lbs) 2   ABduction PROM;5 reps;Strengthening;10 reps   Shoulder ABduction Weight (lbs) 2     Shoulder Exercises: Prone   Other Prone Exercises [place] exercises; 1#; 15X     Shoulder Exercises: Standing   Protraction Strengthening;10 reps   Protraction Weight (lbs) 2   Horizontal ABduction Strengthening;10 reps   Horizontal ABduction Weight (lbs) 2   External Rotation Strengthening;10 reps   External Rotation Weight (lbs) 2   Internal Rotation Strengthening;10 reps   Internal Rotation Weight (lbs) 2   Flexion  Strengthening;10 reps   Shoulder Flexion Weight (lbs) 2   ABduction Strengthening;10 reps   Shoulder ABduction Weight (lbs) 2     Shoulder Exercises: ROM/Strengthening   X to V Arms 10X with 2#   Proximal Shoulder Strengthening, Supine 10X with 2# no rest breaks   Proximal Shoulder Strengthening, Seated 10X with 2#   Ball on Wall 1' flexion, 1' abduction     Manual Therapy   Manual Therapy Myofascial release   Manual therapy comments Manual therapy completed prior to exercises.                  OT Short Term Goals - 12/13/16 1325      OT SHORT TERM GOAL #1   Title Patient will be educated and independent with HEP to increase functional use of LUE during daily tasks.    Time 4   Period Weeks     OT SHORT TERM GOAL #2   Title Patient will increase P/ROM to WNL  to increase ability to completing dressing tasks with less difficulty.    Time 4   Period Weeks     OT SHORT TERM GOAL #3   Title Patient will increase LUE strenght to 3+/5 to increase ability to work towards completing activities at shoulder level with less difficulty.    Time 4   Period Weeks     OT SHORT TERM GOAL #4   Title Patient will decrease pain in LUE during daily tasks to 3/10 or less.   Time 4   Period Weeks   Status On-going     OT SHORT TERM GOAL #5   Title Patient will decrease fascial restrictions to mod amount in LUE to increase functional mobility needed to complete reaching tasks.    Time 4   Period Weeks           OT Long Term Goals - 11/16/16 1025      OT LONG TERM GOAL #1   Title Patient will return to highest level of independence while using LUE as non-dominant extremity for all daily tasks.    Time 8   Period Weeks   Status On-going     OT LONG TERM GOAL #2   Title Patient will increase A/ROM of LUE to WNL to increase ability to complete overhead reaching tasks with less difficulty.    Time 8   Period Weeks   Status On-going     OT LONG TERM GOAL #3   Title Patient will increase LUE strength to 4+/5 in order to return to normal leisure activities such as gardening.    Time 8   Period Weeks   Status On-going     OT LONG TERM GOAL #4   Title Patient will decrease pain level in LUE during daily tasks to 2/10 or less.    Time 8   Period Weeks   Status On-going     OT LONG TERM GOAL #5   Title Patient will decrease fascial restrictions to min amount or less in LUE in order to hook and unhook bra from behind with less difficulty as well as fix her hair.   Time 8   Period Weeks   Status On-going               Plan - 12/25/16 1154    Clinical Impression Statement A: Increased weights to 2# for supine and standing. Patient was able to complete Cybex row and press as well to increase shoulder strength and  stability. Patient reports  that she will be unable to attend next weeks appointments and we discussed completing her last session on Thursday with her being discharged. Patient in agreement.    Plan P: Reassess and discharge. Update HEP.      Patient will benefit from skilled therapeutic intervention in order to improve the following deficits and impairments:  Increased muscle spasms, Decreased strength, Decreased range of motion, Pain, Impaired UE functional use, Increased fascial restricitons  Visit Diagnosis: Acute pain of left shoulder  Stiffness of left shoulder, not elsewhere classified  Other symptoms and signs involving the musculoskeletal system    Problem List Patient Active Problem List   Diagnosis Date Noted  . 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 GERD 11/05/2007  . CELIAC DISEASE 11/05/2007  . HYPERTRIGLYCERIDEMIA 11/04/2007  . ALLERGIC RHINITIS, SEASONAL 11/04/2007  . HEMATOCHEZIA 11/04/2007  . ABDOMINAL PAIN, EPIGASTRIC 11/04/2007  . EARLY SATIETY 11/04/2007  . NONSPEC ELEVATION OF LEVELS OF TRANSAMINASE/LDH 11/04/2007   Ailene Ravel, OTR/L,CBIS  [phone]  12/25/2016, 12:13 PM  [facility] [address] [place], [place], [address] Phone: [phone]   Fax:  [phone]  Name: GAILE ALLMON MRN: [id number] Date of Birth: September 27, 1955

## 2016-12-26 ENCOUNTER — Telehealth: Payer: Self-pay | Admitting: *Deleted

## 2016-12-27 ENCOUNTER — Ambulatory Visit (HOSPITAL_COMMUNITY): Payer: BLUE CROSS/BLUE SHIELD

## 2016-12-27 ENCOUNTER — Encounter (HOSPITAL_COMMUNITY): Payer: Self-pay

## 2016-12-27 DIAGNOSIS — M25612 Stiffness of left shoulder, not elsewhere classified: Secondary | ICD-10-CM

## 2016-12-27 DIAGNOSIS — R29898 Other symptoms and signs involving the musculoskeletal system: Secondary | ICD-10-CM

## 2016-12-27 DIAGNOSIS — M25512 Pain in left shoulder: Secondary | ICD-10-CM

## 2016-12-27 NOTE — Therapy (Signed)
[facility] [place], [place], [address] Phone: [phone]   Fax:  [phone]  Occupational Therapy Treatment  Patient Details  Name: Jordan Weaver MRN: [id number] Date of Birth: [birth year] Referring Provider: Dr. Tania Ade  Encounter Date: 12/27/2016      OT End of Session - 12/27/16 1046    Visit Number 13   Number of Visits 16   Authorization Type BCBS $0 co pay   Authorization Time Period Visit limit to 30  OT/PT/SLP combined. (2 used this year)   Authorization - Visit Number 15   Authorization - Number of Visits 30   OT Start Time [phone]   OT Stop Time 1030   OT Time Calculation (min) 40 min   Activity Tolerance Patient tolerated treatment well   Behavior During Therapy WFL for tasks assessed/performed      Past Medical History:  Diagnosis Date  . Celiac disease   . GERD (gastroesophageal reflux disease)     Past Surgical History:  Procedure Laterality Date  . BREAST SURGERY     knot removed  . DILATION AND CURETTAGE OF UTERUS     multpile  . ROTATOR CUFF REPAIR    . SALPINGOOPHORECTOMY Left 1975  . SPINE SURGERY      There were no vitals filed for this visit.      Subjective Assessment - 12/27/16 1022    Subjective  S: I'm using the band in the door and the one i hold on to.    Currently in Pain? Yes   Pain Score 4    Pain Location Shoulder   Pain Orientation Left   Pain Descriptors / Indicators Aching   Pain Type Acute pain            [facility] OT Assessment - 12/27/16 0953      Assessment   Diagnosis Left shoulder arthroscopy     Precautions   Precautions Shoulder   Type of Shoulder Precautions Progress as tolerated     AROM   Overall AROM Comments Assessed seated. IR/er adducted.   AROM Assessment Site Shoulder   Right/Left Shoulder Left   Left Shoulder Flexion 160 Degrees  previous: 146   Left Shoulder ABduction 166 Degrees  previous: 165   Left Shoulder Internal  Rotation 90 Degrees  previous: same   Left Shoulder External Rotation 70 Degrees  previous: 65     PROM   Overall PROM Comments Assessed supine. IR/er adducted.   PROM Assessment Site Shoulder   Right/Left Shoulder Left   Left Shoulder Flexion 170 Degrees  previous: 164   Left Shoulder ABduction 180 Degrees   Left Shoulder Internal Rotation 90 Degrees   Left Shoulder External Rotation 85 Degrees     Strength   Overall Strength Comments Assessed seated. IR/er adducted.   Strength Assessment Site Shoulder   Right/Left Shoulder Left   Left Shoulder Flexion 5/5  previous: 4/5   Left Shoulder ABduction 5/5  previous: 4/5   Left Shoulder Internal Rotation 5/5  previosu: 4+/5   Left Shoulder External Rotation 4+/5  previous: 4/5                  OT Treatments/Exercises (OP) - 12/27/16 0953      Exercises   Exercises Shoulder     Shoulder Exercises: Standing   Protraction Theraband;12 reps   Theraband Level (Shoulder Protraction) Level 3 (Green)   Horizontal ABduction Theraband;12 reps   Theraband Level (Shoulder Horizontal  ABduction) Level 3 (Green)   External Rotation Theraband;12 reps   Theraband Level (Shoulder External Rotation) Level 3 (Green)   Internal Rotation Theraband;12 reps   Theraband Level (Shoulder Internal Rotation) Level 3 (Green)   Flexion Theraband;12 reps   Theraband Level (Shoulder Flexion) Level 3 (Green)   ABduction Theraband;12 reps   Theraband Level (Shoulder ABduction) Level 3 (Green)   Extension Theraband;12 reps   Theraband Level (Shoulder Extension) Level 3 (Green)     Manual Therapy   Manual Therapy Myofascial release   Manual therapy comments Manual therapy completed prior to exercises.   Myofascial Release Myofascial release and manual stretching completed to left upper arm, trapezius, and scapularis region to decrease fascial restrictions and increase joint mobility in a pain free zone.                  OT Education  - 12/27/16 1026    Education provided Yes   Education Details Reviewed HEP and made recommendation on which exercises to continue, frequency and duration, etc   Person(s) Educated Patient   Methods Explanation   Comprehension Verbalized understanding          OT Short Term Goals - 12/27/16 1010      OT SHORT TERM GOAL #1   Title Patient will be educated and independent with HEP to increase functional use of LUE during daily tasks.    Time 4   Period Weeks     OT SHORT TERM GOAL #2   Title Patient will increase P/ROM to WNL to increase ability to completing dressing tasks with less difficulty.    Time 4   Period Weeks     OT SHORT TERM GOAL #3   Title Patient will increase LUE strenght to 3+/5 to increase ability to work towards completing activities at shoulder level with less difficulty.    Time 4   Period Weeks     OT SHORT TERM GOAL #4   Title Patient will decrease pain in LUE during daily tasks to 3/10 or less.   Baseline Patient reports that pain will be more of a 4/10 during daily tasks.    Time 4   Period Weeks   Status Not Met     OT SHORT TERM GOAL #5   Title Patient will decrease fascial restrictions to mod amount in LUE to increase functional mobility needed to complete reaching tasks.    Time 4   Period Weeks           OT Long Term Goals - 12/27/16 1011      OT LONG TERM GOAL #1   Title Patient will return to highest level of independence while using LUE as non-dominant extremity for all daily tasks.    Time 8   Period Weeks   Status Achieved     OT LONG TERM GOAL #2   Title Patient will increase A/ROM of LUE to WNL to increase ability to complete overhead reaching tasks with less difficulty.    Time 8   Period Weeks   Status Achieved     OT LONG TERM GOAL #3   Title Patient will increase LUE strength to 4+/5 in order to return to normal leisure activities such as gardening.    Time 8   Period Weeks   Status Achieved     OT LONG TERM GOAL  #4   Title Patient will decrease pain level in LUE during daily tasks to 2/10 or less.  Time 8   Period Weeks   Status Not Met     OT LONG TERM GOAL #5   Title Patient will decrease fascial restrictions to min amount or less in LUE in order to hook and unhook bra from behind with less difficulty as well as fix her hair.   Time 8   Period Weeks   Status Achieved               Plan - 12/27/16 1047    Clinical Impression Statement A: Reassessment and discharge completed this date. patient has met all short term and long term goals with the exception of her pain goal. patient reports that with contiuous daily use she sometimes experiences 4/10 pain. She is curently completing shoulder and scapular strengthening with red band and was given the green band to progress to. Pt is in agreement with discharge.    Plan P: D/C from therapy with HEP.      Patient will benefit from skilled therapeutic intervention in order to improve the following deficits and impairments:  Increased muscle spasms, Decreased strength, Decreased range of motion, Pain, Impaired UE functional use, Increased fascial restricitons  Visit Diagnosis: Acute pain of left shoulder  Stiffness of left shoulder, not elsewhere classified  Other symptoms and signs involving the musculoskeletal system    Problem List Patient Active Problem List   Diagnosis Date Noted  . 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 GERD 11/05/2007  . CELIAC DISEASE 11/05/2007  . HYPERTRIGLYCERIDEMIA 11/04/2007  . ALLERGIC RHINITIS, SEASONAL 11/04/2007  . HEMATOCHEZIA 11/04/2007  . ABDOMINAL PAIN, EPIGASTRIC 11/04/2007  . EARLY SATIETY 11/04/2007  . NONSPEC ELEVATION OF LEVELS OF TRANSAMINASE/LDH 11/04/2007    OCCUPATIONAL THERAPY DISCHARGE SUMMARY  Visits from Start of Care: 13  Current functional level related to goals / functional outcomes: See above   Remaining deficits: See above    Education / Equipment: See above Plan: Patient agrees to discharge.  Patient goals were met. Patient is being discharged due to meeting the stated rehab goals.  ?????         Ailene Ravel, OTR/L,CBIS  [phone]  12/27/2016, 10:52 AM  [facility] [address] [place], [place], [address] Phone: [phone]   Fax:  [phone]  Name: Jordan Weaver MRN: [id number] Date of Birth: [birth year]

## 2017-01-01 ENCOUNTER — Encounter (HOSPITAL_COMMUNITY): Payer: BLUE CROSS/BLUE SHIELD

## 2017-01-01 DIAGNOSIS — I10 Essential (primary) hypertension: Secondary | ICD-10-CM | POA: Insufficient documentation

## 2017-01-03 ENCOUNTER — Encounter (HOSPITAL_COMMUNITY): Payer: BLUE CROSS/BLUE SHIELD

## 2017-01-04 ENCOUNTER — Telehealth: Payer: Self-pay | Admitting: *Deleted

## 2017-01-11 ENCOUNTER — Ambulatory Visit (HOSPITAL_COMMUNITY): Payer: BLUE CROSS/BLUE SHIELD

## 2017-01-17 ENCOUNTER — Ambulatory Visit (HOSPITAL_COMMUNITY)
Admission: RE | Admit: 2017-01-17 | Discharge: 2017-01-17 | Disposition: A | Discharge status: Home / Self Care | Payer: BLUE CROSS/BLUE SHIELD | Source: Ambulatory Visit | Attending: Obstetrics and Gynecology | Admitting: Obstetrics and Gynecology

## 2017-01-17 DIAGNOSIS — Z1231 Encounter for screening mammogram for malignant neoplasm of breast: Secondary | ICD-10-CM | POA: Diagnosis not present

## 2017-01-22 ENCOUNTER — Telehealth: Payer: Self-pay | Admitting: *Deleted

## 2017-01-22 ENCOUNTER — Ambulatory Visit (INDEPENDENT_AMBULATORY_CARE_PROVIDER_SITE_OTHER): Payer: BLUE CROSS/BLUE SHIELD | Admitting: Internal Medicine

## 2017-01-22 ENCOUNTER — Encounter: Payer: Self-pay | Admitting: Internal Medicine

## 2017-01-22 ENCOUNTER — Other Ambulatory Visit: Payer: BLUE CROSS/BLUE SHIELD

## 2017-01-22 VITALS — BP 122/76 | HR 80 | Ht 67.0 in | Wt 154.0 lb

## 2017-01-22 DIAGNOSIS — J986 Disorders of diaphragm: Secondary | ICD-10-CM | POA: Diagnosis not present

## 2017-01-22 DIAGNOSIS — R0609 Other forms of dyspnea: Principal | ICD-10-CM

## 2017-01-22 NOTE — Patient Instructions (Signed)
ICD-10-CM   1. Dyspnea on exertion R06.09   2. 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

## 2017-01-22 NOTE — Progress Notes (Signed)
Subjective:    Patient ID: Jordan Weaver, female    DOB: [birth year], 61 y.o.   MRN: [id number] PCP Deloria Lair., MD  HPI  IOV 01/22/2017  Chief Complaint  Patient presents with  . Advice Only    abnormal CXR right lung, CT scan collapsed lung. shoulder surgery july '18, mod dyspnea on exertoin   61 year old female new consult. She is here with her husband. 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 She tells me that [place] in [place] she had left shoulder surgery and after that she started noticing new onset of shortness of breathfor activities much lighter than before. She tells me that she gets dyspneic now cleaning bathrooms all walking upstairs relieved by rest. This no associated cough or wheezing. She has a family history of asthma and she's been on ACE inhibitors for 20 years but she does not have any cough or wheezing. The onset of dyspnea is definitely related to the left shoulder surgery. This no orthopnea paroxysmal nocturnal dyspnea or cough or hemoptysis. This no fever or weight loss. She underwent evaluation by her primary care physician. She had a CT chest with contrast. Images are not available for my visualization. Date of test is 01/11/2017. The report shows that she has markedly elevation of right diaphragm associated with right middle lobe and right lower lobe atelectasis. In addition she has fatty livershe also has an aberrant right subclavian artery. She denies any chest pain or previous cardiac workup. There is no weight loss  Walking desaturation test done today on room air with a full head probe. Resting pulse ox 95%. Resting heart rate 92%. Final heart rate 104/m. Resting pulse ox 94%. Did not desaturate.  Review of Systems  Constitutional: Positive for unexpected weight change.  HENT: Positive for congestion, dental problem, ear pain, postnasal drip, sinus  pressure, sneezing and sore throat. Negative for rhinorrhea.   Respiratory: Positive for cough, chest tightness and shortness of breath.   Allergic/Immunologic: Negative.   Hematological: Bruises/bleeds easily.      has a past medical history of Celiac disease; GERD (gastroesophageal reflux disease); and Hypertension.   reports that she has never smoked. She has never used smokeless tobacco.  Past Surgical History:  Procedure Laterality Date  . BREAST SURGERY     knot removed  . DILATION AND CURETTAGE OF UTERUS     multpile  . ROTATOR CUFF REPAIR    . SALPINGOOPHORECTOMY Left 1975  . SPINE SURGERY      Allergies  Allergen Reactions  . Codeine     REACTION: unknown     There is no immunization history on file for this patient.  Family History  Problem Relation Age of Onset  . Asthma Mother   . Cancer Mother        breast  . Hypertension Mother   . Arthritis Mother   . Cancer Father        colon  . Kidney disease Father   . Cancer Sister        breast  . Hypertension Sister      Current Outpatient Prescriptions:  .  aspirin 81 MG tablet, Take 81 mg by mouth daily., Disp: , Rfl:  .  benazepril (LOTENSIN) 10 MG tablet, daily. , Disp: , Rfl: 3 .  betamethasone dipropionate (DIPROLENE) 0.05 % cream, Apply topically 2 (two) times daily., Disp: 30 g,  Rfl: 4 .  DULoxetine (CYMBALTA) 30 MG capsule, Take 30 mg by mouth daily., Disp: , Rfl:  .  furosemide (LASIX) 20 MG tablet, Take 10 mg by mouth every morning., Disp: , Rfl: 3 .  Multiple Vitamin (MULTIVITAMIN) tablet, Take 1 tablet by mouth daily., Disp: , Rfl:  .  omeprazole (PRILOSEC) 20 MG capsule, Take 20 mg by mouth daily., Disp: , Rfl:  .  spironolactone (ALDACTONE) 25 MG tablet, Take 25 mg by mouth 2 (two) times daily., Disp: , Rfl:       Objective:   Physical Exam  Constitutional: She is oriented to person, place, and time. She appears well-developed and well-nourished. No distress.  HENT:  Head:  Normocephalic and atraumatic.  Right Ear: External ear normal.  Left Ear: External ear normal.  Mouth/Throat: Oropharynx is clear and moist. No oropharyngeal exudate.  Eyes: Pupils are equal, round, and reactive to light. Conjunctivae and EOM are normal. Right eye exhibits no discharge. Left eye exhibits no discharge. No scleral icterus.  Neck: Normal range of motion. Neck supple. No JVD present. No tracheal deviation present. No thyromegaly present.  Cardiovascular: Normal rate, regular rhythm, normal heart sounds and intact distal pulses.  Exam reveals no gallop and no friction rub.   No murmur heard. Pulmonary/Chest: Effort normal and breath sounds normal. No respiratory distress. She has no wheezes. She has no rales. She exhibits no tenderness.  Diminished air entry right base  Abdominal: Soft. Bowel sounds are normal. She exhibits no distension and no mass. There is no tenderness. There is no rebound and no guarding.  Musculoskeletal: Normal range of motion. She exhibits no edema or tenderness.  Lymphadenopathy:    She has no cervical adenopathy.  Neurological: She is alert and oriented to person, place, and time. She has normal reflexes. No cranial nerve deficit. She exhibits normal muscle tone. Coordination normal.  Skin: Skin is warm and dry. No rash noted. She is not diaphoretic. No erythema. No pallor.  Psychiatric: She has a normal mood and affect. Her behavior is normal. Judgment and thought content normal.  Vitals reviewed.   Vitals:   01/22/17 0911  BP: 122/76  Pulse: 80  SpO2: 97%  Height: 5' 7"  (1.702 m)    Estimated body mass index is 24.97 kg/m as calculated from the following:   Height as of 09/15/15: 5' 7"  (1.702 m).   Weight as of 12/05/16: 159 lb 6.4 oz (72.3 kg).        Assessment & Plan:     ICD-10-CM   1. Dyspnea on exertion R06.09 D-Dimer, Quantitative    ECHOCARDIOGRAM COMPLETE    DG Sniff Test  2. 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 Etiology will be unknown. I would like looked at the images. I'll rule out alternative etiology. We discussed about diaphragm  5 diaphragmaticaralysis and the general treatment and management course   Do d-dimer blood work - if elevaed might need VQ scan or CT angio rule out blood clot Do ECHO cardiogram Do Sniff test Bring CD ROM of CT scan [facility] at next visit  followup - next few weeks to regroup with me or APP to discuss above   Dr. Brand Males, M.D., [facility].C.P Pulmonary and Critical Care Medicine Staff Physician [facility] Pulmonary and Critical Care Pager: [phone], If no answer or between  15:00h - 7:00h: call [phone]  01/22/2017 9:59 AM

## 2017-01-22 NOTE — Telephone Encounter (Signed)
LMOM that mammogram was normal

## 2017-01-23 ENCOUNTER — Other Ambulatory Visit: Payer: Self-pay

## 2017-01-23 ENCOUNTER — Ambulatory Visit (HOSPITAL_COMMUNITY): Payer: BLUE CROSS/BLUE SHIELD | Attending: Cardiovascular Disease

## 2017-01-23 DIAGNOSIS — R0609 Other forms of dyspnea: Secondary | ICD-10-CM | POA: Diagnosis not present

## 2017-01-23 DIAGNOSIS — I119 Hypertensive heart disease without heart failure: Secondary | ICD-10-CM | POA: Diagnosis not present

## 2017-01-23 LAB — D-DIMER, QUANTITATIVE (NOT AT ARMC): D DIMER QUANT: 0.85 ug{FEU}/mL — AB (ref <0.50)

## 2017-01-25 ENCOUNTER — Ambulatory Visit (HOSPITAL_COMMUNITY)
Admission: RE | Admit: 2017-01-25 | Discharge: 2017-01-25 | Disposition: A | Discharge status: Home / Self Care | Payer: BLUE CROSS/BLUE SHIELD | Source: Ambulatory Visit | Attending: Internal Medicine | Admitting: Internal Medicine

## 2017-01-25 DIAGNOSIS — R0609 Other forms of dyspnea: Secondary | ICD-10-CM | POA: Insufficient documentation

## 2017-01-25 DIAGNOSIS — R9389 Abnormal findings on diagnostic imaging of other specified body structures: Secondary | ICD-10-CM | POA: Insufficient documentation

## 2017-01-28 ENCOUNTER — Telehealth: Payer: Self-pay | Admitting: Internal Medicine

## 2017-01-28 DIAGNOSIS — R7989 Other specified abnormal findings of blood chemistry: Secondary | ICD-10-CM

## 2017-01-28 NOTE — Telephone Encounter (Signed)
LEt [name] know that there are multiple reasons for hyer dyspnea  1. 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 ECho  - stiff heart muscle that does not relax well  3. D-dimer high but borderline - this could indicate she has blood clot in her lung but given other 2 issues I doubt it. To be on safe side: she should get duplex lower extremity of leg to rule out blood clot in leg - do it in the local area or  next fw days  4. Any woprsning dyspnea- go to ER  Dr. Brand Males, M.D., [facility].C.P Pulmonary and Critical Care Medicine Staff Physician [place] Pulmonary and Critical Care Pager: [phone], If no answer or between  15:00h - 7:00h: call [phone]  01/28/2017 9:25 AM

## 2017-01-28 NOTE — Telephone Encounter (Signed)
Called pt letting her know the possible reasons for the dyspnea and that MR wanted her to have the duplex lower extremities to rule out blood clots. Pt expressed understanding. Will place order for the study. Nothing further needed.

## 2017-01-29 ENCOUNTER — Ambulatory Visit (HOSPITAL_COMMUNITY)
Admission: RE | Admit: 2017-01-29 | Discharge: 2017-01-29 | Disposition: A | Discharge status: Home / Self Care | Payer: BLUE CROSS/BLUE SHIELD | Source: Ambulatory Visit | Attending: Internal Medicine | Admitting: Internal Medicine

## 2017-01-29 ENCOUNTER — Other Ambulatory Visit: Payer: Self-pay | Admitting: *Deleted

## 2017-01-29 DIAGNOSIS — R7989 Other specified abnormal findings of blood chemistry: Secondary | ICD-10-CM | POA: Insufficient documentation

## 2017-01-30 ENCOUNTER — Telehealth: Payer: Self-pay | Admitting: Internal Medicine

## 2017-01-30 NOTE — Telephone Encounter (Signed)
MR please advise on results.

## 2017-01-31 NOTE — Telephone Encounter (Signed)
Duplex LE Ultrasound - negative for DVT. I will hold off on getting CT angio because she had CT chest with contrast at [facility] but she is to bring that CD rom when she comes  Dr. Brand Males, M.D., [facility].C.P Pulmonary and Critical Care Medicine Staff Physician [place] Pulmonary and Critical Care Pager: [phone], If no answer or between  15:00h - 7:00h: call [phone]  01/31/2017 9:38 AM

## 2017-01-31 NOTE — Telephone Encounter (Signed)
Spoke with pt, aware of results/recs.  Nothing further needed.  

## 2017-02-14 ENCOUNTER — Ambulatory Visit (INDEPENDENT_AMBULATORY_CARE_PROVIDER_SITE_OTHER): Payer: BLUE CROSS/BLUE SHIELD | Admitting: Internal Medicine

## 2017-02-14 ENCOUNTER — Encounter: Payer: Self-pay | Admitting: Internal Medicine

## 2017-02-14 VITALS — BP 116/78 | HR 78 | Ht 67.0 in | Wt 159.6 lb

## 2017-02-14 DIAGNOSIS — J986 Disorders of diaphragm: Secondary | ICD-10-CM

## 2017-02-14 DIAGNOSIS — I5189 Other ill-defined heart diseases: Secondary | ICD-10-CM

## 2017-02-14 DIAGNOSIS — G545 Neuralgic amyotrophy: Secondary | ICD-10-CM

## 2017-02-14 DIAGNOSIS — I519 Heart disease, unspecified: Secondary | ICD-10-CM

## 2017-02-14 DIAGNOSIS — R0609 Other forms of dyspnea: Secondary | ICD-10-CM | POA: Diagnosis not present

## 2017-02-14 MED ORDER — FUROSEMIDE 40 MG PO TABS: 40.0000 mg | ORAL_TABLET | Freq: Every day | ORAL | 3 refills | Status: DC

## 2017-02-14 MED ORDER — POTASSIUM CHLORIDE ER 20 MEQ PO TBCR: 20.0000 meq | EXTENDED_RELEASE_TABLET | Freq: Every day | ORAL | 3 refills | Status: DC

## 2017-02-14 NOTE — Addendum Note (Signed)
Addended by: Lorretta Harp on: 02/14/2017 12:45 PM   Modules accepted: Orders

## 2017-02-14 NOTE — Patient Instructions (Signed)
ICD-10-CM   1. Dyspnea on exertion R06.09   2. 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 Diastolic dysfunction R30.8      There is no evidence of asthma I do not think this was a blood clot issue Shortness of breath is because of elevated right hemidiaphragm and stiff heart muscle I do not know why you have the paralyzed hemidiaphragm; the closest description I can find is a condition called Parsonage-Turner syndrome  Plan -Increase Lasix to 40 mg once daily.  Take it with potassium chloride 20 mg once daily  - referral to pulmonary rehabilitation at  [facility] -I will review your CT scan of the chest from [facility] with 1 of our local radiologist  -In particular will be interested in knowing if the aberrant subclavian artery played a role in paralyzing the right diaphragm  -I might consider referral to [facility] based on input from our local radiologist  -Please talk to primary care physician and make sure you get a right upper quadrant liver ultrasound and liver function test to ensure that the liver is okay because of the raised diaphragm and anatomic dislocation of the liver  Follow-up 8 weeks or sooner if needed -

## 2017-02-14 NOTE — Progress Notes (Signed)
Subjective:     Patient ID: Jordan Weaver, female   DOB: [birth year], 61 y.o.   MRN: [id number]  HPI  PCP Deloria Lair., MD  HPI  IOV 01/22/2017  Chief Complaint  Patient presents with  . Advice Only    abnormal CXR right lung, CT scan collapsed lung. shoulder surgery july '18, mod dyspnea on exertoin   61 year old female new consult. She is here with her husband. 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 She tells me that [place] in [place] of [place] she had left shoulder surgery and after that she started noticing new onset of shortness of breathfor activities much lighter than before. She tells me that she gets dyspneic now cleaning bathrooms all walking upstairs relieved by rest. This no associated cough or wheezing. She has a family history of asthma and she's been on ACE inhibitors for 20 years but she does not have any cough or wheezing. The onset of dyspnea is definitely related to the left shoulder surgery. This no orthopnea paroxysmal nocturnal dyspnea or cough or hemoptysis. This no fever or weight loss. She underwent evaluation by her primary care physician. She had a CT chest with contrast. Images are not available for my visualization. Date of test is 01/11/2017. The report shows that she has markedly elevation of right diaphragm associated with right middle lobe and right lower lobe atelectasis. In addition she has fatty livershe also has an aberrant right subclavian artery. She denies any chest pain or previous cardiac workup. There is no weight loss  Walking desaturation test done today on room air with a full head probe. Resting pulse ox 95%. Resting heart rate 92%. Final heart rate 104/m. Resting pulse ox 94%. Did not desaturate.   OV 02/14/2017  Chief Complaint  Patient presents with  . Follow-up    Pt states that she is the same as she was at last visit. States that she  is still becoming SOB when doing minor activities but has learned to slow down, coughing usually when laying down at night, and occ. CP.   Fu test results. Acute dyspnea following sholder surgery on left side.   1. 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 ECho  - stiff heart muscle that does not relax well: pressure (grade 2 diastolic dysfunction). - Pulmonary arteries: Systolic pressure was mildly increased. PA   peak pressure: 35 mm Hg (S)  3. D-dimer high but borderline: Duplex LE Ultrasound - negative for DVT.    4. FeNO 02/14/2017 -= 22pb and normal    has a past medical history of Celiac disease, GERD (gastroesophageal reflux disease), and Hypertension.   reports that  has never smoked. she has never used smokeless tobacco.  Past Surgical History:  Procedure Laterality Date  . BREAST SURGERY     knot removed  . DILATION AND CURETTAGE OF UTERUS     multpile  . ROTATOR CUFF REPAIR    . SALPINGOOPHORECTOMY Left 1975  . SPINE SURGERY      Allergies  Allergen Reactions  . Codeine Nausea And Vomiting    Immunization History  Administered Date(s) Administered  . Influenza-Unspecified 01/31/2017    Family History  Problem Relation Age of Onset  . Asthma Mother   . Cancer Mother        breast  . Hypertension Mother   . Arthritis Mother   . Cancer Father  colon  . Kidney disease Father   . Cancer Sister        breast  . Hypertension Sister      Current Outpatient Medications:  .  aspirin 81 MG tablet, Take 81 mg by mouth daily., Disp: , Rfl:  .  benazepril (LOTENSIN) 10 MG tablet, daily. , Disp: , Rfl: 3 .  betamethasone dipropionate (DIPROLENE) 0.05 % cream, Apply topically 2 (two) times daily., Disp: 30 g, Rfl: 4 .  DULoxetine (CYMBALTA) 30 MG capsule, Take 30 mg by mouth daily., Disp: , Rfl:  .  furosemide (LASIX) 20 MG tablet, Take 10 mg by mouth every morning., Disp: , Rfl: 3 .  gabapentin (NEURONTIN) 100 MG capsule, Take by mouth., Disp: , Rfl:   .  omeprazole (PRILOSEC) 20 MG capsule, Take 20 mg by mouth daily., Disp: , Rfl:  .  spironolactone (ALDACTONE) 25 MG tablet, Take 25 mg by mouth 2 (two) times daily., Disp: , Rfl:    Review of Systems     Objective:   Physical Exam Vitals:   02/14/17 1142  BP: 116/78  Pulse: 78  SpO2: 97%  Weight: 159 lb 9.6 oz (72.4 kg)  Height: 5' 7"  (1.702 m)     Estimated body mass index is 25 kg/m as calculated from the following:   Height as of this encounter: 5' 7"  (1.702 m).   Weight as of this encounter: 159 lb 9.6 oz (72.4 kg). DIOSCUSSION ONLY VISIT    Assessment:       ICD-10-CM   1. Dyspnea on exertion R06.09   2. 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 Diastolic dysfunction B71.6        Plan:      There is no evidence of asthma I do not think this was a blood clot issue Shortness of breath is because of elevated right hemidiaphragm and stiff heart muscle I do not know why you have the paralyzed hemidiaphragm; the closest description I can find is a condition called Parsonage-Turner syndrome  Plan -Increase Lasix to 40 mg once daily.  Take it with potassium chloride 20 mg once daily  - referral to pulmonary rehabilitation at  [facility] -I will review your CT scan of the chest from [facility] with 1 of our local radiologist  -In particular will be interested in knowing if the aberrant subclavian artery played a role in paralyzing the right diaphragm  -I might consider referral to [facility] based on input from our local radiologist  -Please talk to primary care physician and make sure you get a right upper quadrant liver ultrasound and liver function test to ensure that the liver is okay because of the raised diaphragm and anatomic dislocation of the liver  Follow-up 8 weeks or sooner if needed -     > 50% of this > 25 min visit spent in face to face counseling or coordination of care     Dr. Brand Males, M.D.,  [facility].C.P Pulmonary and Critical Care Medicine Staff Physician [facility] Pager: [phone], If no answer or between  15:00h - 7:00h: call [phone]  02/14/2017 12:28 PM

## 2017-02-15 ENCOUNTER — Telehealth: Payer: Self-pay | Admitting: Internal Medicine

## 2017-02-15 NOTE — Telephone Encounter (Signed)
Faxed last OV note to [facility], Nothing further is needed.

## 2017-02-19 ENCOUNTER — Encounter (HOSPITAL_COMMUNITY): Payer: Self-pay | Admitting: Physical Therapy

## 2017-02-19 NOTE — Therapy (Signed)
[facility] [place], [place], [address] Phone: [phone]   Fax:  [phone]  Patient Details  Name: CAOIMHE DAMRON MRN: [id number] Date of Birth: 29-Mar-1956 Referring Provider:  No ref. provider found  Encounter Date: 02/19/2017  PHYSICAL THERAPY DISCHARGE SUMMARY  Visits from Start of Care: 2  Current functional level related to goals / functional outcomes: Patient has not returned since the last session, DC per policy    Remaining deficits: Unable to assess    Education / Equipment: Unable to assess  Plan: Patient agrees to discharge.  Patient goals were not met. Patient is being discharged due to a change in medical status.  ?????         Deniece Ree PT, DPT, CBIS  Supplemental Physical Therapist [facility] [address] [place], [place], [address] Phone: [phone]   Fax:  [phone]

## 2017-03-04 ENCOUNTER — Encounter (HOSPITAL_COMMUNITY)
Admission: RE | Admit: 2017-03-04 | Discharge: 2017-03-04 | Disposition: A | Discharge status: Home / Self Care | Payer: BLUE CROSS/BLUE SHIELD | Source: Ambulatory Visit | Attending: Internal Medicine | Admitting: Internal Medicine

## 2017-03-04 VITALS — BP 108/82 | HR 90 | Ht 67.0 in | Wt 164.6 lb

## 2017-03-04 DIAGNOSIS — Z79899 Other long term (current) drug therapy: Secondary | ICD-10-CM | POA: Diagnosis not present

## 2017-03-04 DIAGNOSIS — K900 Celiac disease: Secondary | ICD-10-CM | POA: Diagnosis not present

## 2017-03-04 DIAGNOSIS — Z7982 Long term (current) use of aspirin: Secondary | ICD-10-CM | POA: Diagnosis not present

## 2017-03-04 DIAGNOSIS — K219 Gastro-esophageal reflux disease without esophagitis: Secondary | ICD-10-CM | POA: Insufficient documentation

## 2017-03-04 DIAGNOSIS — R0609 Other forms of dyspnea: Secondary | ICD-10-CM | POA: Diagnosis not present

## 2017-03-04 DIAGNOSIS — I10 Essential (primary) hypertension: Secondary | ICD-10-CM | POA: Insufficient documentation

## 2017-03-04 NOTE — Progress Notes (Signed)
Daily Session Note  Patient Details  Name: Jordan Weaver MRN: [id number] Date of Birth: January 01, 1956 Referring Provider:     PULMONARY REHAB OTHER RESP ORIENTATION from 03/04/2017 in [place]  Referring Provider  Dr. Chase Caller      Encounter Date: 03/04/2017  Check In: Session Check In - 03/04/17 0938      Check-In   Location  [facility]    Staff Present  Russella Dar, MS, EP, [facility], Exercise Physiologist;Debra Wynetta Emery, RN, BSN    Supervising physician immediately available to respond to emergencies  See telemetry face sheet for immediately available MD    Fall or balance concerns reported     No Have fallen but more slips    Tobacco Cessation  -- Never smoker    Resistance Training Performed  Yes    VAD Patient?  No      Pain Assessment   Currently in Pain?  No/denies    Multiple Pain Sites  No       Capillary Blood Glucose: No results found for this or any previous visit (from the past 24 hour(s)).    Social History   Tobacco Use  Smoking Status Never Smoker  Smokeless Tobacco Never Used    Goals Met:  Proper associated with RPD/PD & O2 Sat Independence with exercise equipment Exercise tolerated well No report of cardiac concerns or symptoms Strength training completed today  Goals Unmet:  Not Applicable  Comments: Check out 10:30   Dr. Kate Sable is Medical Director for [facility] and Pulmonary Rehab.

## 2017-03-04 NOTE — Progress Notes (Signed)
Pulmonary Individual Treatment Plan  Patient Details  Name: Jordan Weaver MRN: [id number] Date of Birth: March 18, 1956 Referring Provider:     PULMONARY REHAB OTHER RESP ORIENTATION from 03/04/2017 in [place]  Referring Provider  Dr. Chase Caller      Initial Encounter Date:    [place] from 03/04/2017 in [place]  Date  03/04/17  Referring Provider  Dr. Chase Caller      Visit Diagnosis: Dyspnea on exertion  Patient's Home Medications on Admission:   Current Outpatient Medications:  .  aspirin 81 MG tablet, Take 81 mg by mouth daily., Disp: , Rfl:  .  benazepril (LOTENSIN) 10 MG tablet, 10 mg daily. , Disp: , Rfl: 3 .  betamethasone dipropionate (DIPROLENE) 0.05 % cream, Apply topically 2 (two) times daily., Disp: 30 g, Rfl: 4 .  DULoxetine (CYMBALTA) 30 MG capsule, Take 30 mg by mouth daily., Disp: , Rfl:  .  furosemide (LASIX) 40 MG tablet, Take 1 tablet (40 mg total) daily by mouth., Disp: 60 tablet, Rfl: 3 .  gabapentin (NEURONTIN) 100 MG capsule, Take by mouth., Disp: , Rfl:  .  omeprazole (PRILOSEC) 20 MG capsule, Take 20 mg by mouth daily., Disp: , Rfl:  .  Potassium Chloride ER 20 MEQ TBCR, Take 20 mEq daily by mouth., Disp: 60 tablet, Rfl: 3 .  spironolactone (ALDACTONE) 25 MG tablet, Take 25 mg by mouth daily. , Disp: , Rfl:   Past Medical History: Past Medical History:  Diagnosis Date  . Celiac disease   . GERD (gastroesophageal reflux disease)   . Hypertension     Tobacco Use: Social History   Tobacco Use  Smoking Status Never Smoker  Smokeless Tobacco Never Used    Labs: Recent Review Flowsheet Data    There is no flowsheet data to display.      Capillary Blood Glucose: No results found for: GLUCAP   Pulmonary Assessment Scores: Pulmonary Assessment Scores    Row Name 03/04/17 0954         ADL UCSD   ADL Phase  Entry     SOB Score total  70     Rest  0     Walk  8      Stairs  4     Bath  4     Dress  3     Shop  2       CAT Score   CAT Score  17       mMRC Score   mMRC Score  3        Pulmonary Function Assessment:   Exercise Target Goals: Date: 03/04/17  Exercise Program Goal: Individual exercise prescription set with THRR, safety & activity barriers. Participant demonstrates ability to understand and report RPE using BORG scale, to self-measure pulse accurately, and to acknowledge the importance of the exercise prescription.  Exercise Prescription Goal: Starting with aerobic activity 30 plus minutes a day, 3 days per week for initial exercise prescription. Provide home exercise prescription and guidelines that participant acknowledges understanding prior to discharge.  Activity Barriers & Risk Stratification: Activity Barriers & Cardiac Risk Stratification - 03/04/17 1005      Activity Barriers & Cardiac Risk Stratification   Activity Barriers  Back Problems;Joint Problems;Shortness of Breath;Chest Pain/Angina    Cardiac Risk Stratification  Low       6 Minute Walk: 6 Minute Walk    Row Name 03/04/17 1004  6 Minute Walk   Phase  Initial     Distance  1250 feet     Distance % Change  0 %     Distance Feet Change  0 ft     Walk Time  6 minutes     # of Rest Breaks  0     MPH  2.36     METS  2.81     RPE  10     Perceived Dyspnea   13     VO2 Peak  11.47     Symptoms  Yes (comment)     Comments  Chest discomfort at exertion     Resting HR  90 bpm     Resting BP  108/82     Resting Oxygen Saturation   95 %     Exercise Oxygen Saturation  during 6 min walk  90 %     Max Ex. HR  96 bpm     Max Ex. BP  126/80     2 Minute Post BP  110/84        Oxygen Initial Assessment: Oxygen Initial Assessment - 03/04/17 1016      Home Oxygen   Home Oxygen Device  None    Sleep Oxygen Prescription  None    Home Exercise Oxygen Prescription  None    Home at Rest Exercise Oxygen Prescription  None      Initial 6 min  Walk   Oxygen Used  None      Program Oxygen Prescription   Program Oxygen Prescription  None       Oxygen Re-Evaluation:   Oxygen Discharge (Final Oxygen Re-Evaluation):   Initial Exercise Prescription: Initial Exercise Prescription - 03/04/17 1000      Date of Initial Exercise RX and Referring Provider   Date  03/04/17    Referring Provider  Dr. Chase Caller      Treadmill   MPH  1.3    Grade  0    Minutes  15    METs  1.9      NuStep   Level  2    SPM  81    Minutes  20    METs  1.9      Prescription Details   Frequency (times per week)  2    Duration  Progress to 30 minutes of continuous aerobic without signs/symptoms of physical distress      Intensity   THRR 40-80% of Max Heartrate  [phone]    Ratings of Perceived Exertion  11-13    Perceived Dyspnea  0-4      Progression   Progression  Continue progressive overload as per policy without signs/symptoms or physical distress.      Resistance Training   Training Prescription  Yes    Weight  1    Reps  10-15       Perform Capillary Blood Glucose checks as needed.  Exercise Prescription Changes:   Exercise Comments:   Exercise Goals and Review:  Exercise Goals    Row Name 03/04/17 1006             Exercise Goals   Increase Physical Activity  Yes       Intervention  Provide advice, education, support and counseling about physical activity/exercise needs.;Develop an individualized exercise prescription for aerobic and resistive training based on initial evaluation findings, risk stratification, comorbidities and participant's personal goals.       Expected Outcomes  Achievement of increased  cardiorespiratory fitness and enhanced flexibility, muscular endurance and strength shown through measurements of functional capacity and personal statement of participant.       Increase Strength and Stamina  Yes       Intervention  Develop an individualized exercise prescription for aerobic and resistive  training based on initial evaluation findings, risk stratification, comorbidities and participant's personal goals.;Provide advice, education, support and counseling about physical activity/exercise needs.       Expected Outcomes  Achievement of increased cardiorespiratory fitness and enhanced flexibility, muscular endurance and strength shown through measurements of functional capacity and personal statement of participant.       Able to understand and use rate of perceived exertion (RPE) scale  Yes       Intervention  Provide education and explanation on how to use RPE scale       Expected Outcomes  Short Term: Able to use RPE daily in rehab to express subjective intensity level;Long Term:  Able to use RPE to guide intensity level when exercising independently       Able to understand and use Dyspnea scale  Yes       Intervention  Provide education and explanation on how to use Dyspnea scale       Expected Outcomes  Short Term: Able to use Dyspnea scale daily in rehab to express subjective sense of shortness of breath during exertion;Long Term: Able to use Dyspnea scale to guide intensity level when exercising independently       Knowledge and understanding of Target Heart Rate Range (THRR)  Yes       Intervention  Provide education and explanation of THRR including how the numbers were predicted and where they are located for reference       Expected Outcomes  Short Term: Able to state/look up THRR       Able to check pulse independently  Yes       Intervention  Provide education and demonstration on how to check pulse in carotid and radial arteries.;Review the importance of being able to check your own pulse for safety during independent exercise       Expected Outcomes  Short Term: Able to explain why pulse checking is important during independent exercise;Long Term: Able to check pulse independently and accurately       Understanding of Exercise Prescription  Yes       Intervention  Provide  education, explanation, and written materials on patient's individual exercise prescription       Expected Outcomes  Short Term: Able to explain program exercise prescription;Long Term: Able to explain home exercise prescription to exercise independently          Exercise Goals Re-Evaluation :   Discharge Exercise Prescription (Final Exercise Prescription Changes):   Nutrition:  Target Goals: Understanding of nutrition guidelines, daily intake of sodium <1573m, cholesterol <2087m calories 30% from fat and 7% or less from saturated fats, daily to have 5 or more servings of fruits and vegetables.  Biometrics: Pre Biometrics - 03/04/17 1007      Pre Biometrics   Height  5' 7"  (1.702 m)    Weight  164 lb 9.6 oz (74.7 kg)    Waist Circumference  34.5 inches    Hip Circumference  39 inches    Waist to Hip Ratio  0.88 %    BMI (Calculated)  25.77    Triceps Skinfold  18 mm    % Body Fat  34.6 %    Grip Strength  65.4 kg    Flexibility  0 in    Single Leg Stand  14 seconds        Nutrition Therapy Plan and Nutrition Goals: Nutrition Therapy & Goals - 03/04/17 1018      Personal Nutrition Goals   Personal Goal #2  Patient is eating a heart healthy diet.     Additional Goals?  No       Nutrition Discharge: Rate Your Plate Scores: Nutrition Assessments - 03/04/17 1018      MEDFICTS Scores   Pre Score  39       Nutrition Goals Re-Evaluation:   Nutrition Goals Discharge (Final Nutrition Goals Re-Evaluation):   Psychosocial: Target Goals: Acknowledge presence or absence of significant depression and/or stress, maximize coping skills, provide positive support system. Participant is able to verbalize types and ability to use techniques and skills needed for reducing stress and depression.  Initial Review & Psychosocial Screening: Initial Psych Review & Screening - 03/04/17 1022      Initial Review   Current issues with  None Identified      Family Dynamics   Good  Support System?  Yes      Barriers   Psychosocial barriers to participate in program  There are no identifiable barriers or psychosocial needs. QOL score 20.47      Screening Interventions   Interventions  Encouraged to exercise       Quality of Life Scores: Quality of Life - 03/04/17 1008      Quality of Life Scores   Health/Function Pre  15.5 %    Socioeconomic Pre  24.36 %    Psych/Spiritual Pre  23.36 %    Family Pre  28.5 %    GLOBAL Pre  20.47 %       PHQ-9: Recent Review Flowsheet Data    Depression screen [facility] 2/9 03/04/2017   Decreased Interest 0   Down, Depressed, Hopeless 0   PHQ - 2 Score 0   Altered sleeping 0   Tired, decreased energy 1   Change in appetite 0   Feeling bad or failure about yourself  0   Trouble concentrating 0   Moving slowly or fidgety/restless 0   Suicidal thoughts 0   PHQ-9 Score 1   Difficult doing work/chores Somewhat difficult     Interpretation of Total Score  Total Score Depression Severity:  1-4 = Minimal depression, 5-9 = Mild depression, 10-14 = Moderate depression, 15-19 = Moderately severe depression, 20-27 = Severe depression   Psychosocial Evaluation and Intervention: Psychosocial Evaluation - 03/04/17 1023      Psychosocial Evaluation & Interventions   Interventions  Encouraged to exercise with the program and follow exercise prescription    Continue Psychosocial Services   No Follow up required       Psychosocial Re-Evaluation:   Psychosocial Discharge (Final Psychosocial Re-Evaluation):    Education: Education Goals: Education classes will be provided on a weekly basis, covering required topics. Participant will state understanding/return demonstration of topics presented.  Learning Barriers/Preferences: Learning Barriers/Preferences - 03/04/17 9147      Learning Barriers/Preferences   Learning Barriers  None    Learning Preferences  Written Material;Pictoral       Education Topics: How Lungs Work  and Diseases: - Discuss the anatomy of the lungs and diseases that can affect the lungs, such as COPD.   Exercise: -Discuss the importance of exercise, FITT principles of exercise, normal and abnormal responses to exercise, and how to exercise  safely.   Environmental Irritants: -Discuss types of environmental irritants and how to limit exposure to environmental irritants.   Meds/Inhalers and oxygen: - Discuss respiratory medications, definition of an inhaler and oxygen, and the proper way to use an inhaler and oxygen.   Energy Saving Techniques: - Discuss methods to conserve energy and decrease shortness of breath when performing activities of daily living.    Bronchial Hygiene / Breathing Techniques: - Discuss breathing mechanics, pursed-lip breathing technique,  proper posture, effective ways to clear airways, and other functional breathing techniques   Cleaning Equipment: - Provides group verbal and written instruction about the health risks of elevated stress, cause of high stress, and healthy ways to reduce stress.   Nutrition I: Fats: - Discuss the types of cholesterol, what cholesterol does to the body, and how cholesterol levels can be controlled.   Nutrition II: Labels: -Discuss the different components of food labels and how to read food labels.   Respiratory Infections: - Discuss the signs and symptoms of respiratory infections, ways to prevent respiratory infections, and the importance of seeking medical treatment when having a respiratory infection.   Stress I: Signs and Symptoms: - Discuss the causes of stress, how stress may lead to anxiety and depression, and ways to limit stress.   Stress II: Relaxation: -Discuss relaxation techniques to limit stress.   Oxygen for Home/Travel: - Discuss how to prepare for travel when on oxygen and proper ways to transport and store oxygen to ensure safety.   Knowledge Questionnaire Score: Knowledge Questionnaire  Score - 03/04/17 0954      Knowledge Questionnaire Score   Pre Score  14/18       Core Components/Risk Factors/Patient Goals at Admission: Personal Goals and Risk Factors at Admission - 03/04/17 1018      Core Components/Risk Factors/Patient Goals on Admission    Weight Management  Weight Maintenance    Develop more efficient breathing techniques such as purse lipped breathing and diaphragmatic breathing; and practicing self-pacing with activity  Yes    Intervention  Provide education, demonstration and support about specific breathing techniuqes utilized for more efficient breathing. Include techniques such as pursed lipped breathing, diaphragmatic breathing and self-pacing activity.    Expected Outcomes  Short Term: Participant will be able to demonstrate and use breathing techniques as needed throughout daily activities.    Personal Goal Other  Yes    Personal Goal  Breath better, Be able to return to maintaining her home, taking care of husband and dog.     Intervention  Attend PR program 2 x week and supplement at home exercise 3 x week.     Expected Outcomes  Reach goals       Core Components/Risk Factors/Patient Goals Review:    Core Components/Risk Factors/Patient Goals at Discharge (Final Review):    ITP Comments:   Comments: Patient arrived for 1st visit/orientation/education at 0800. Patient was referred to PR by Dr. Chase Caller due to Dyspnea on exertion (R06.09). During orientation advised patient on arrival and appointment times what to wear, what to do before, during and after exercise. Reviewed attendance and class policy. Talked about inclement weather and class consultation policy. Pt is scheduled to return Pulmonary Rehab on 03/12/17 at 1:30. Pt was advised to come to class 15 minutes before class starts. Patient was also given instructions on meeting with the dietician and attending the Family Structure classes. Discussed RPE/Dpysnea scales. Discussed initial THR and  how to find their radial and/or carotid pulse. Discussed the  initial exercise prescription and how this effects their progress. Pt is eager to get started. Patient participated in warm up stretches followed by light weights and resistance bands. Patient was able to complete 6 minute walk test. Patient c/o chest discomfort at end of walk test. Discomfort subsided at 2 minute rest. Discomfort was completely gone at the end of orientation. Patient was measured for the equipment. Discussed equipment safety with patient. Took patient pre-anthropometric measurements. Patient finished visit at 10:30.

## 2017-03-04 NOTE — Progress Notes (Signed)
Cardiac/Pulmonary Rehab Medication Review by a Pharmacist  Does the patient  feel that his/her medications are working for him/her?  yes  Has the patient been experiencing any side effects to the medications prescribed?  no  Does the patient measure his/her own blood pressure or blood glucose at home?  yes   Does the patient have any problems obtaining medications due to transportation or finances?   no  Understanding of regimen: excellent Understanding of indications: excellent Potential of compliance: excellent  Questions asked to Determine Patient Understanding of Medication Regimen:  1. What is the name of the medication?  2. What is the medication used for?  3. When should it be taken?  4. How much should be taken?  5. How will you take it?  6. What side effects should you report?  Understanding Defined as: Excellent: All questions above are correct Good: Questions 1-4 are correct Fair: Questions 1-2 are correct  Poor: 1 or none of the above questions are correct   Pharmacist comments: Pt does not c/o any side effects from medication.  Pt states she checks her BP at home but not always daily.  Does it especially when she feels bad.  No issues noted.  Med list updated.    Jordan Weaver A 03/04/2017 9:16 AM

## 2017-03-12 ENCOUNTER — Encounter (HOSPITAL_COMMUNITY): Payer: Self-pay

## 2017-03-12 ENCOUNTER — Encounter (HOSPITAL_COMMUNITY): Payer: BLUE CROSS/BLUE SHIELD

## 2017-03-13 ENCOUNTER — Telehealth: Payer: Self-pay | Admitting: Internal Medicine

## 2017-03-13 NOTE — Telephone Encounter (Signed)
reviwed a  Note 11/28'18 from pcp that kidney, potassum and sugar and liver tests were normal

## 2017-03-14 ENCOUNTER — Encounter (HOSPITAL_COMMUNITY)
Admission: RE | Admit: 2017-03-14 | Discharge: 2017-03-14 | Disposition: A | Discharge status: Home / Self Care | Payer: BLUE CROSS/BLUE SHIELD | Source: Ambulatory Visit | Attending: Internal Medicine | Admitting: Internal Medicine

## 2017-03-14 ENCOUNTER — Encounter (INDEPENDENT_AMBULATORY_CARE_PROVIDER_SITE_OTHER): Payer: Self-pay | Admitting: *Deleted

## 2017-03-14 DIAGNOSIS — R0609 Other forms of dyspnea: Secondary | ICD-10-CM | POA: Diagnosis not present

## 2017-03-14 NOTE — Progress Notes (Signed)
Daily Session Note  Patient Details  Name: Jordan Weaver MRN: [id number] Date of Birth: Sep 08, 1955 Referring Provider:     PULMONARY REHAB OTHER RESP ORIENTATION from 03/04/2017 in [place]  Referring Provider  Dr. Chase Caller      Encounter Date: 03/14/2017  Check In: Session Check In - 03/14/17 1330      Check-In   Location  [facility]    Staff Present  Diane Angelina Pih, MS, EP, [place], Exercise Physiologist;Jalayne Ganesh Wynetta Emery, RN, BSN    Supervising physician immediately available to respond to emergencies  See telemetry face sheet for immediately available MD    Medication changes reported      No    Fall or balance concerns reported     No    Warm-up and Cool-down  Performed as group-led instruction    Resistance Training Performed  Yes    VAD Patient?  No      Pain Assessment   Currently in Pain?  No/denies    Pain Score  0-No pain    Multiple Pain Sites  No       Capillary Blood Glucose: No results found for this or any previous visit (from the past 24 hour(s)).    Social History   Tobacco Use  Smoking Status Never Smoker  Smokeless Tobacco Never Used    Goals Met:  Proper associated with RPD/PD & O2 Sat Independence with exercise equipment Improved SOB with ADL's Using PLB without cueing & demonstrates good technique Exercise tolerated well No report of cardiac concerns or symptoms Strength training completed today  Goals Unmet:  Not Applicable  Comments: Check out 1430.   Dr. Sinda Du is Medical Director for [facility].

## 2017-03-18 ENCOUNTER — Ambulatory Visit (INDEPENDENT_AMBULATORY_CARE_PROVIDER_SITE_OTHER): Payer: BLUE CROSS/BLUE SHIELD | Admitting: Internal Medicine

## 2017-03-18 ENCOUNTER — Encounter: Payer: Self-pay | Admitting: Internal Medicine

## 2017-03-18 VITALS — BP 116/76 | HR 89 | Ht 67.0 in | Wt 156.8 lb

## 2017-03-18 DIAGNOSIS — J986 Disorders of diaphragm: Secondary | ICD-10-CM

## 2017-03-18 DIAGNOSIS — R0609 Other forms of dyspnea: Secondary | ICD-10-CM

## 2017-03-18 DIAGNOSIS — I5189 Other ill-defined heart diseases: Secondary | ICD-10-CM

## 2017-03-18 DIAGNOSIS — I519 Heart disease, unspecified: Secondary | ICD-10-CM

## 2017-03-18 NOTE — Patient Instructions (Signed)
ICD-10-CM   1. Dyspnea on exertion R06.09   2. 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 Diastolic dysfunction C45.8    Glad you might be some better but really glad you're not any worse Shortness of breath is due to stiff heart muscle and elevated right diaphragm  Plan - Continue Lasix , continue weight and also glad you have started  pulmonary rehabilitation  Follow-up 3 months or sooner if needed; will consider chest x-ray follow-up

## 2017-03-18 NOTE — Progress Notes (Signed)
Subjective:     Patient ID: Jordan Weaver, female   DOB: March 13, 1956, 61 y.o.   MRN: [id number]  HPI   PCP Deloria Lair., MD  HPI  IOV 01/22/2017  Chief Complaint  Patient presents with  . Advice Only    abnormal CXR right lung, CT scan collapsed lung. shoulder surgery july '18, mod dyspnea on exertoin   61 year old female new consult. She is here with her husband. 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 She tells me that sometime in July [place] in [place] of [place] she had left shoulder surgery and after that she started noticing new onset of shortness of breathfor activities much lighter than before. She tells me that she gets dyspneic now cleaning bathrooms all walking upstairs relieved by rest. This no associated cough or wheezing. She has a family history of asthma and she's been on ACE inhibitors for 20 years but she does not have any cough or wheezing. The onset of dyspnea is definitely related to the left shoulder surgery. This no orthopnea paroxysmal nocturnal dyspnea or cough or hemoptysis. This no fever or weight loss. She underwent evaluation by her primary care physician. She had a CT chest with contrast. Images are not available for my visualization. Date of test is 01/11/2017. The report shows that she has markedly elevation of right diaphragm associated with right middle lobe and right lower lobe atelectasis. In addition she has fatty livershe also has an aberrant right subclavian artery. She denies any chest pain or previous cardiac workup. There is no weight loss  Walking desaturation test done today on room air with a full head probe. Resting pulse ox 95%. Resting heart rate 92%. Final heart rate 104/m. Resting pulse ox 94%. Did not desaturate.   OV 02/14/2017  Chief Complaint  Patient presents with  . Follow-up    Pt states that she is the same as she was at last visit. States that  she is still becoming SOB when doing minor activities but has learned to slow down, coughing usually when laying down at night, and occ. CP.   Fu test results. Acute dyspnea following sholder surgery on left side.   1. 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 ECho  - stiff heart muscle that does not relax well: pressure (grade 2 diastolic dysfunction). - Pulmonary arteries: Systolic pressure was mildly increased. PA   peak pressure: 35 mm Hg (S)  3. D-dimer high but borderline: Duplex LE Ultrasound - negative for DVT.    4. FeNO 02/14/2017 -= 22pb and normal   OV 03/18/2017  Chief Complaint  Patient presents with  . Follow-up    Pt has begun rehab at [facility] and stated that since she has neuropathy she does not know how long she can do it.  Pt has had some tightness in chest x2 days and does have SOB when doing activities.  Denies any cough.     Follow-up dyspnea due to paralyzed right hemidiaphragm and also diastolic dysfunction  Since her last visit 5 weeks ago she's lost weight. She is also taking Lasix. These measures seemed to have helped her dyspnea little bit only. Nevertheless it is definitely a little bit better. She just started pulmonary rehabilitation 1 class and did well. 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 She status post back surgeries 20 years ago but has been dealing with  bilateral feet neuropathy only in the last 1 year. She's not interested in second opinion for diaphragmatic paralysis   has a past medical history of Celiac disease, GERD (gastroesophageal reflux disease), and Hypertension.   reports that  has never smoked. she has never used smokeless tobacco.  Past Surgical History:  Procedure Laterality Date  . BREAST SURGERY     knot removed  . DILATION AND CURETTAGE OF UTERUS     multpile  . ROTATOR CUFF REPAIR    . SALPINGOOPHORECTOMY Left 1975  . SPINE SURGERY      Allergies  Allergen Reactions  . Codeine  Nausea And Vomiting    Immunization History  Administered Date(s) Administered  . Influenza-Unspecified 01/31/2017    Family History  Problem Relation Age of Onset  . Asthma Mother   . Cancer Mother        breast  . Hypertension Mother   . Arthritis Mother   . Cancer Father        colon  . Kidney disease Father   . Cancer Sister        breast  . Hypertension Sister      Current Outpatient Medications:  .  aspirin 81 MG tablet, Take 81 mg by mouth daily., Disp: , Rfl:  .  benazepril (LOTENSIN) 10 MG tablet, 10 mg daily. , Disp: , Rfl: 3 .  betamethasone dipropionate (DIPROLENE) 0.05 % cream, Apply topically 2 (two) times daily., Disp: 30 g, Rfl: 4 .  DULoxetine (CYMBALTA) 30 MG capsule, Take 30 mg by mouth 2 (two) times daily. , Disp: , Rfl:  .  furosemide (LASIX) 40 MG tablet, Take 1 tablet (40 mg total) daily by mouth., Disp: 60 tablet, Rfl: 3 .  gabapentin (NEURONTIN) 100 MG capsule, Take 100 mg by mouth 2 (two) times daily. , Disp: , Rfl:  .  omeprazole (PRILOSEC) 20 MG capsule, Take 20 mg by mouth daily., Disp: , Rfl:  .  Potassium Chloride ER 20 MEQ TBCR, Take 20 mEq daily by mouth., Disp: 60 tablet, Rfl: 3 .  spironolactone (ALDACTONE) 25 MG tablet, Take 25 mg by mouth daily. , Disp: , Rfl:    Review of Systems     Objective:   Physical Exam  Constitutional: She is oriented to person, place, and time. She appears well-developed and well-nourished. No distress.  HENT:  Head: Normocephalic and atraumatic.  Right Ear: External ear normal.  Left Ear: External ear normal.  Mouth/Throat: Oropharynx is clear and moist. No oropharyngeal exudate.  Eyes: Conjunctivae and EOM are normal. Pupils are equal, round, and reactive to light. Right eye exhibits no discharge. Left eye exhibits no discharge. No scleral icterus.  Neck: Normal range of motion. Neck supple. No JVD present. No tracheal deviation present. No thyromegaly present.  Cardiovascular: Normal rate, regular  rhythm, normal heart sounds and intact distal pulses. Exam reveals no gallop and no friction rub.  No murmur heard. Pulmonary/Chest: Effort normal and breath sounds normal. No respiratory distress. She has no wheezes. She has no rales. She exhibits no tenderness.  ? Air entry improved in the right base  Abdominal: Soft. Bowel sounds are normal. She exhibits no distension and no mass. There is no tenderness. There is no rebound and no guarding.  Musculoskeletal: Normal range of motion. She exhibits no edema or tenderness.  Lymphadenopathy:    She has no cervical adenopathy.  Neurological: She is alert and oriented to person, place, and time. She has normal reflexes. No cranial nerve  deficit. She exhibits normal muscle tone. Coordination normal.  Skin: Skin is warm and dry. No rash noted. She is not diaphoretic. No erythema. No pallor.  Psychiatric: She has a normal mood and affect. Her behavior is normal. Judgment and thought content normal.  Vitals reviewed.  Vitals:   03/18/17 1414  BP: 116/76  Pulse: 89  SpO2: 96%  Weight: 156 lb 12.8 oz (71.1 kg)  Height: 5' 7"  (1.702 m)    Estimated body mass index is 24.56 kg/m as calculated from the following:   Height as of this encounter: 5' 7"  (1.702 m).   Weight as of this encounter: 156 lb 12.8 oz (71.1 kg).      Assessment:       ICD-10-CM   1. Dyspnea on exertion R06.09   2. 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 Diastolic dysfunction A56.9        Plan:      Glad you might be some better but really glad you're not any worse Shortness of breath is due to stiff heart muscle and elevated right diaphragm  Plan - Continue Lasix , continue weight and also glad you have started  pulmonary rehabilitation  Follow-up 3 months or sooner if needed; will consider chest x-ray follow-up  (> 50% of this 15 min visit spent in face to face counseling or/and coordination of care)   Dr. Brand Males, M.D., [facility].C.P Pulmonary and Critical Care  Medicine Staff Physician, [place] Director - Interstitial Lung Disease  Program  Pulmonary [place] [facility] [place], [place], [address]  Pager: [phone], If no answer or between  15:00h - 7:00h: call [phone] Telephone: [phone]

## 2017-03-19 ENCOUNTER — Encounter (HOSPITAL_COMMUNITY)
Admission: RE | Admit: 2017-03-19 | Discharge: 2017-03-19 | Disposition: A | Discharge status: Home / Self Care | Payer: BLUE CROSS/BLUE SHIELD | Source: Ambulatory Visit | Attending: Internal Medicine | Admitting: Internal Medicine

## 2017-03-19 DIAGNOSIS — R0609 Other forms of dyspnea: Principal | ICD-10-CM

## 2017-03-19 NOTE — Progress Notes (Signed)
Daily Session Note  Patient Details  Name: Jordan Weaver MRN: [id number] Date of Birth: September 28, 1955 Referring Provider:     PULMONARY REHAB OTHER RESP ORIENTATION from 03/04/2017 in [place]  Referring Provider  Dr. Chase Caller      Encounter Date: 03/19/2017  Check In: Session Check In - 03/19/17 1406      Check-In   Location  [facility]    Staff Present  Russella Dar, MS, EP, [facility], Exercise Physiologist;Kealan Buchan Luther Parody, BS, EP, Exercise Physiologist    Supervising physician immediately available to respond to emergencies  See telemetry face sheet for immediately available MD    Medication changes reported      No    Fall or balance concerns reported     No    Warm-up and Cool-down  Performed as group-led instruction    Resistance Training Performed  Yes    VAD Patient?  No      Pain Assessment   Currently in Pain?  No/denies    Pain Score  0-No pain    Multiple Pain Sites  No       Capillary Blood Glucose: No results found for this or any previous visit (from the past 24 hour(s)).    Social History   Tobacco Use  Smoking Status Never Smoker  Smokeless Tobacco Never Used    Goals Met:  Independence with exercise equipment Improved SOB with ADL's Using PLB without cueing & demonstrates good technique Exercise tolerated well No report of cardiac concerns or symptoms Strength training completed today  Goals Unmet:  Not Applicable  Comments: Check out 230   Dr. Sinda Du is Medical Director for [facility] Pulmonary [facility].

## 2017-03-20 NOTE — Progress Notes (Signed)
Pulmonary Individual Treatment Plan  Patient Details  Name: Jordan Weaver MRN: [id number] Date of Birth: 1955/04/15 Referring Provider:     PULMONARY REHAB OTHER RESP ORIENTATION from 03/04/2017 in [place]  Referring Provider  Dr. Chase Caller      Initial Encounter Date:    [facility] from 03/04/2017 in [place]  Date  03/04/17  Referring Provider  Dr. Chase Caller      Visit Diagnosis: Dyspnea on exertion  Patient's Home Medications on Admission:   Current Outpatient Medications:  .  aspirin 81 MG tablet, Take 81 mg by mouth daily., Disp: , Rfl:  .  benazepril (LOTENSIN) 10 MG tablet, 10 mg daily. , Disp: , Rfl: 3 .  betamethasone dipropionate (DIPROLENE) 0.05 % cream, Apply topically 2 (two) times daily., Disp: 30 g, Rfl: 4 .  DULoxetine (CYMBALTA) 30 MG capsule, Take 30 mg by mouth 2 (two) times daily. , Disp: , Rfl:  .  furosemide (LASIX) 40 MG tablet, Take 1 tablet (40 mg total) daily by mouth., Disp: 60 tablet, Rfl: 3 .  gabapentin (NEURONTIN) 100 MG capsule, Take 100 mg by mouth 2 (two) times daily. , Disp: , Rfl:  .  omeprazole (PRILOSEC) 20 MG capsule, Take 20 mg by mouth daily., Disp: , Rfl:  .  Potassium Chloride ER 20 MEQ TBCR, Take 20 mEq daily by mouth., Disp: 60 tablet, Rfl: 3 .  spironolactone (ALDACTONE) 25 MG tablet, Take 25 mg by mouth daily. , Disp: , Rfl:   Past Medical History: Past Medical History:  Diagnosis Date  . Celiac disease   . GERD (gastroesophageal reflux disease)   . Hypertension     Tobacco Use: Social History   Tobacco Use  Smoking Status Never Smoker  Smokeless Tobacco Never Used    Labs: Recent Review Flowsheet Data    There is no flowsheet data to display.      Capillary Blood Glucose: No results found for: GLUCAP   Pulmonary Assessment Scores: Pulmonary Assessment Scores    Row Name 03/04/17 0954         ADL UCSD   ADL Phase  Entry     SOB  Score total  70     Rest  0     Walk  8     Stairs  4     Bath  4     Dress  3     Shop  2       CAT Score   CAT Score  17       mMRC Score   mMRC Score  3        Pulmonary Function Assessment:   Exercise Target Goals:    Exercise Program Goal: Individual exercise prescription set with THRR, safety & activity barriers. Participant demonstrates ability to understand and report RPE using BORG scale, to self-measure pulse accurately, and to acknowledge the importance of the exercise prescription.  Exercise Prescription Goal: Starting with aerobic activity 30 plus minutes a day, 3 days per week for initial exercise prescription. Provide home exercise prescription and guidelines that participant acknowledges understanding prior to discharge.  Activity Barriers & Risk Stratification: Activity Barriers & Cardiac Risk Stratification - 03/04/17 1005      Activity Barriers & Cardiac Risk Stratification   Activity Barriers  Back Problems;Joint Problems;Shortness of Breath;Chest Pain/Angina    Cardiac Risk Stratification  Low       6 Minute Walk: 6 Minute Walk  Row Name 03/04/17 1004         6 Minute Walk   Phase  Initial     Distance  1250 feet     Distance % Change  0 %     Distance Feet Change  0 ft     Walk Time  6 minutes     # of Rest Breaks  0     MPH  2.36     METS  2.81     RPE  10     Perceived Dyspnea   13     VO2 Peak  11.47     Symptoms  Yes (comment)     Comments  Chest discomfort at exertion     Resting HR  90 bpm     Resting BP  108/82     Resting Oxygen Saturation   95 %     Exercise Oxygen Saturation  during 6 min walk  90 %     Max Ex. HR  96 bpm     Max Ex. BP  126/80     2 Minute Post BP  110/84        Oxygen Initial Assessment: Oxygen Initial Assessment - 03/04/17 1016      Home Oxygen   Home Oxygen Device  None    Sleep Oxygen Prescription  None    Home Exercise Oxygen Prescription  None    Home at Rest Exercise Oxygen  Prescription  None      Initial 6 min Walk   Oxygen Used  None      Program Oxygen Prescription   Program Oxygen Prescription  None       Oxygen Re-Evaluation:   Oxygen Discharge (Final Oxygen Re-Evaluation):   Initial Exercise Prescription: Initial Exercise Prescription - 03/04/17 1000      Date of Initial Exercise RX and Referring Provider   Date  03/04/17    Referring Provider  Dr. Chase Caller      Treadmill   MPH  1.3    Grade  0    Minutes  15    METs  1.9      NuStep   Level  2    SPM  81    Minutes  20    METs  1.9      Prescription Details   Frequency (times per week)  2    Duration  Progress to 30 minutes of continuous aerobic without signs/symptoms of physical distress      Intensity   THRR 40-80% of Max Heartrate  [phone]    Ratings of Perceived Exertion  11-13    Perceived Dyspnea  0-4      Progression   Progression  Continue progressive overload as per policy without signs/symptoms or physical distress.      Resistance Training   Training Prescription  Yes    Weight  1    Reps  10-15       Perform Capillary Blood Glucose checks as needed.  Exercise Prescription Changes:  Exercise Prescription Changes    Row Name 03/19/17 1500             Response to Exercise   Blood Pressure (Admit)  120/72       Blood Pressure (Exercise)  136/72       Blood Pressure (Exit)  126/72       Heart Rate (Admit)  95 bpm       Heart Rate (Exercise)  96 bpm  Heart Rate (Exit)  90 bpm       Oxygen Saturation (Admit)  95 %       Oxygen Saturation (Exercise)  93 %       Oxygen Saturation (Exit)  96 %       Rating of Perceived Exertion (Exercise)  11       Perceived Dyspnea (Exercise)  10       Duration  Progress to 30 minutes of  aerobic without signs/symptoms of physical distress       Intensity  THRR New [phone]         Progression   Progression  Continue to progress workloads to maintain intensity without signs/symptoms of physical  distress.         Resistance Training   Training Prescription  Yes       Weight  1       Reps  10-15         Treadmill   MPH  1.8       Grade  0       Minutes  15       METs  2.37         NuStep   Level  2       SPM  106       Minutes  20       METs  2.2         Home Exercise Plan   Plans to continue exercise at  Home (comment)       Frequency  Add 2 additional days to program exercise sessions.       Initial Home Exercises Provided  03/19/17          Exercise Comments:  Exercise Comments    Row Name 03/19/17 1506           Exercise Comments  Patient is doing very well while in PR. She has only recently started but has progressed on the treadmill and has very high SPms on the Nustep and is being considered to be moved to the recumbent elliptical.           Exercise Goals and Review:  Exercise Goals    Row Name 03/04/17 1006             Exercise Goals   Increase Physical Activity  Yes       Intervention  Provide advice, education, support and counseling about physical activity/exercise needs.;Develop an individualized exercise prescription for aerobic and resistive training based on initial evaluation findings, risk stratification, comorbidities and participant's personal goals.       Expected Outcomes  Achievement of increased cardiorespiratory fitness and enhanced flexibility, muscular endurance and strength shown through measurements of functional capacity and personal statement of participant.       Increase Strength and Stamina  Yes       Intervention  Develop an individualized exercise prescription for aerobic and resistive training based on initial evaluation findings, risk stratification, comorbidities and participant's personal goals.;Provide advice, education, support and counseling about physical activity/exercise needs.       Expected Outcomes  Achievement of increased cardiorespiratory fitness and enhanced flexibility, muscular endurance and strength  shown through measurements of functional capacity and personal statement of participant.       Able to understand and use rate of perceived exertion (RPE) scale  Yes       Intervention  Provide education and explanation on how to use RPE scale  Expected Outcomes  Short Term: Able to use RPE daily in rehab to express subjective intensity level;Long Term:  Able to use RPE to guide intensity level when exercising independently       Able to understand and use Dyspnea scale  Yes       Intervention  Provide education and explanation on how to use Dyspnea scale       Expected Outcomes  Short Term: Able to use Dyspnea scale daily in rehab to express subjective sense of shortness of breath during exertion;Long Term: Able to use Dyspnea scale to guide intensity level when exercising independently       Knowledge and understanding of Target Heart Rate Range (THRR)  Yes       Intervention  Provide education and explanation of THRR including how the numbers were predicted and where they are located for reference       Expected Outcomes  Short Term: Able to state/look up THRR       Able to check pulse independently  Yes       Intervention  Provide education and demonstration on how to check pulse in carotid and radial arteries.;Review the importance of being able to check your own pulse for safety during independent exercise       Expected Outcomes  Short Term: Able to explain why pulse checking is important during independent exercise;Long Term: Able to check pulse independently and accurately       Understanding of Exercise Prescription  Yes       Intervention  Provide education, explanation, and written materials on patient's individual exercise prescription       Expected Outcomes  Short Term: Able to explain program exercise prescription;Long Term: Able to explain home exercise prescription to exercise independently          Exercise Goals Re-Evaluation : Exercise Goals Re-Evaluation    Row Name  03/19/17 1504             Exercise Goal Re-Evaluation   Exercise Goals Review  Increase Physical Activity;Increase Strength and Stamina;Knowledge and understanding of Target Heart Rate Range (THRR)       Comments  Patient is doing very well while in PR. She has only recently started but has progressed on the treadmill and has very high SPms on the Nustep and is being considered to be moved to the recumbent elliptical.        Expected Outcomes  Pastient wishes to breathe better and to get back to normal activites.           Discharge Exercise Prescription (Final Exercise Prescription Changes): Exercise Prescription Changes - 03/19/17 1500      Response to Exercise   Blood Pressure (Admit)  120/72    Blood Pressure (Exercise)  136/72    Blood Pressure (Exit)  126/72    Heart Rate (Admit)  95 bpm    Heart Rate (Exercise)  96 bpm    Heart Rate (Exit)  90 bpm    Oxygen Saturation (Admit)  95 %    Oxygen Saturation (Exercise)  93 %    Oxygen Saturation (Exit)  96 %    Rating of Perceived Exertion (Exercise)  11    Perceived Dyspnea (Exercise)  10    Duration  Progress to 30 minutes of  aerobic without signs/symptoms of physical distress    Intensity  THRR New [phone]      Progression   Progression  Continue to progress workloads to maintain intensity without signs/symptoms  of physical distress.      Resistance Training   Training Prescription  Yes    Weight  1    Reps  10-15      Treadmill   MPH  1.8    Grade  0    Minutes  15    METs  2.37      NuStep   Level  2    SPM  106    Minutes  20    METs  2.2      Home Exercise Plan   Plans to continue exercise at  Home (comment)    Frequency  Add 2 additional days to program exercise sessions.    Initial Home Exercises Provided  03/19/17       Nutrition:  Target Goals: Understanding of nutrition guidelines, daily intake of sodium <1560m, cholesterol <2057m calories 30% from fat and 7% or less from saturated fats,  daily to have 5 or more servings of fruits and vegetables.  Biometrics: Pre Biometrics - 03/04/17 1007      Pre Biometrics   Height  5' 7"  (1.702 m)    Weight  164 lb 9.6 oz (74.7 kg)    Waist Circumference  34.5 inches    Hip Circumference  39 inches    Waist to Hip Ratio  0.88 %    BMI (Calculated)  25.77    Triceps Skinfold  18 mm    % Body Fat  34.6 %    Grip Strength  65.4 kg    Flexibility  0 in    Single Leg Stand  14 seconds        Nutrition Therapy Plan and Nutrition Goals: Nutrition Therapy & Goals - 03/04/17 1018      Personal Nutrition Goals   Personal Goal #2  Patient is eating a heart healthy diet.     Additional Goals?  No       Nutrition Discharge: Rate Your Plate Scores: Nutrition Assessments - 03/04/17 1018      MEDFICTS Scores   Pre Score  39       Nutrition Goals Re-Evaluation:   Nutrition Goals Discharge (Final Nutrition Goals Re-Evaluation):   Psychosocial: Target Goals: Acknowledge presence or absence of significant depression and/or stress, maximize coping skills, provide positive support system. Participant is able to verbalize types and ability to use techniques and skills needed for reducing stress and depression.  Initial Review & Psychosocial Screening: Initial Psych Review & Screening - 03/04/17 1022      Initial Review   Current issues with  None Identified      Family Dynamics   Good Support System?  Yes      Barriers   Psychosocial barriers to participate in program  There are no identifiable barriers or psychosocial needs. QOL score 20.47      Screening Interventions   Interventions  Encouraged to exercise       Quality of Life Scores: Quality of Life - 03/04/17 1008      Quality of Life Scores   Health/Function Pre  15.5 %    Socioeconomic Pre  24.36 %    Psych/Spiritual Pre  23.36 %    Family Pre  28.5 %    GLOBAL Pre  20.47 %       PHQ-9: Recent Review Flowsheet Data    [facility]  03/04/2017   Decreased Interest 0   Down, Depressed, Hopeless 0   PHQ - 2 Score 0  Altered sleeping 0   Tired, decreased energy 1   Change in appetite 0   Feeling bad or failure about yourself  0   Trouble concentrating 0   Moving slowly or fidgety/restless 0   Suicidal thoughts 0   PHQ-9 Score 1   Difficult doing work/chores Somewhat difficult     Interpretation of Total Score  Total Score Depression Severity:  1-4 = Minimal depression, 5-9 = Mild depression, 10-14 = Moderate depression, 15-19 = Moderately severe depression, 20-27 = Severe depression   Psychosocial Evaluation and Intervention: Psychosocial Evaluation - 03/04/17 1023      Psychosocial Evaluation & Interventions   Interventions  Encouraged to exercise with the program and follow exercise prescription    Continue Psychosocial Services   No Follow up required       Psychosocial Re-Evaluation:   Psychosocial Discharge (Final Psychosocial Re-Evaluation):    Education: Education Goals: Education classes will be provided on a weekly basis, covering required topics. Participant will state understanding/return demonstration of topics presented.  Learning Barriers/Preferences: Learning Barriers/Preferences - 03/04/17 2423      Learning Barriers/Preferences   Learning Barriers  None    Learning Preferences  Written Material;Pictoral       Education Topics: How Lungs Work and Diseases: - Discuss the anatomy of the lungs and diseases that can affect the lungs, such as COPD.   Exercise: -Discuss the importance of exercise, FITT principles of exercise, normal and abnormal responses to exercise, and how to exercise safely.   Environmental Irritants: -Discuss types of environmental irritants and how to limit exposure to environmental irritants.   Meds/Inhalers and oxygen: - Discuss respiratory medications, definition of an inhaler and oxygen, and the proper way to use an inhaler and oxygen.   Energy  Saving Techniques: - Discuss methods to conserve energy and decrease shortness of breath when performing activities of daily living.    Bronchial Hygiene / Breathing Techniques: - Discuss breathing mechanics, pursed-lip breathing technique,  proper posture, effective ways to clear airways, and other functional breathing techniques   Cleaning Equipment: - Provides group verbal and written instruction about the health risks of elevated stress, cause of high stress, and healthy ways to reduce stress.   PULMONARY REHAB OTHER RESPIRATORY from 03/14/2017 in [place]  Date  03/14/17  Educator  DWynetta Emery  Instruction Review Code  2- Demonstrated Understanding      Nutrition I: Fats: - Discuss the types of cholesterol, what cholesterol does to the body, and how cholesterol levels can be controlled.   Nutrition II: Labels: -Discuss the different components of food labels and how to read food labels.   Respiratory Infections: - Discuss the signs and symptoms of respiratory infections, ways to prevent respiratory infections, and the importance of seeking medical treatment when having a respiratory infection.   Stress I: Signs and Symptoms: - Discuss the causes of stress, how stress may lead to anxiety and depression, and ways to limit stress.   Stress II: Relaxation: -Discuss relaxation techniques to limit stress.   Oxygen for Home/Travel: - Discuss how to prepare for travel when on oxygen and proper ways to transport and store oxygen to ensure safety.   Knowledge Questionnaire Score: Knowledge Questionnaire Score - 03/04/17 0954      Knowledge Questionnaire Score   Pre Score  14/18       Core Components/Risk Factors/Patient Goals at Admission: Personal Goals and Risk Factors at Admission - 03/04/17 1018      Core  Components/Risk Factors/Patient Goals on Admission    Weight Management  Weight Maintenance    Develop more efficient breathing  techniques such as purse lipped breathing and diaphragmatic breathing; and practicing self-pacing with activity  Yes    Intervention  Provide education, demonstration and support about specific breathing techniuqes utilized for more efficient breathing. Include techniques such as pursed lipped breathing, diaphragmatic breathing and self-pacing activity.    Expected Outcomes  Short Term: Participant will be able to demonstrate and use breathing techniques as needed throughout daily activities.    Personal Goal Other  Yes    Personal Goal  Breath better, Be able to return to maintaining her home, taking care of husband and dog.     Intervention  Attend PR program 2 x week and supplement at home exercise 3 x week.     Expected Outcomes  Reach goals       Core Components/Risk Factors/Patient Goals Review:    Core Components/Risk Factors/Patient Goals at Discharge (Final Review):    ITP Comments: ITP Comments    Row Name 03/20/17 0753           ITP Comments  Patient new to program comleting 3 sessions. Will continue to monitor for progress.           Comments: ITP 30 Day REVIEW Patient new to program comleting 3 sessions. Will continue to monitor for progress.

## 2017-03-21 ENCOUNTER — Encounter (HOSPITAL_COMMUNITY)
Admission: RE | Admit: 2017-03-21 | Discharge: 2017-03-21 | Disposition: A | Discharge status: Home / Self Care | Payer: BLUE CROSS/BLUE SHIELD | Source: Ambulatory Visit | Attending: Internal Medicine | Admitting: Internal Medicine

## 2017-03-21 DIAGNOSIS — R0609 Other forms of dyspnea: Principal | ICD-10-CM

## 2017-03-21 NOTE — Progress Notes (Signed)
Daily Session Note  Patient Details  Name: Jordan Weaver MRN: [id number] Date of Birth: Nov 28, 1955 Referring Provider:     PULMONARY REHAB OTHER RESP ORIENTATION from 03/04/2017 in [place]  Referring Provider  Dr. Chase Caller      Encounter Date: 03/21/2017  Check In: Session Check In - 03/21/17 1328      Check-In   Location  [facility]    Staff Present  Aundra Dubin, RN, BSN;Gregory Luther Parody, BS, EP, Exercise Physiologist    Supervising physician immediately available to respond to emergencies  See telemetry face sheet for immediately available MD    Medication changes reported      No    Fall or balance concerns reported     No    Warm-up and Cool-down  Performed as group-led instruction    Resistance Training Performed  Yes    VAD Patient?  No      Pain Assessment   Currently in Pain?  No/denies    Pain Score  0-No pain    Multiple Pain Sites  No       Capillary Blood Glucose: No results found for this or any previous visit (from the past 24 hour(s)).    Social History   Tobacco Use  Smoking Status Never Smoker  Smokeless Tobacco Never Used    Goals Met:  Proper associated with RPD/PD & O2 Sat Independence with exercise equipment Improved SOB with ADL's Using PLB without cueing & demonstrates good technique Exercise tolerated well No report of cardiac concerns or symptoms Strength training completed today  Goals Unmet:  Not Applicable  Comments: Check out 1430.   Dr. Sinda Du is Medical Director for [facility] Pul[facility].

## 2017-03-26 ENCOUNTER — Encounter (HOSPITAL_COMMUNITY): Payer: BLUE CROSS/BLUE SHIELD

## 2017-03-28 ENCOUNTER — Encounter (HOSPITAL_COMMUNITY)
Admission: RE | Admit: 2017-03-28 | Discharge: 2017-03-28 | Disposition: A | Discharge status: Home / Self Care | Payer: BLUE CROSS/BLUE SHIELD | Source: Ambulatory Visit | Attending: Internal Medicine | Admitting: Internal Medicine

## 2017-03-28 DIAGNOSIS — R0609 Other forms of dyspnea: Secondary | ICD-10-CM | POA: Diagnosis not present

## 2017-03-28 NOTE — Progress Notes (Signed)
Daily Session Note  Patient Details  Name: Jordan Weaver MRN: [id number] Date of Birth: 1955-10-07 Referring Provider:     PULMONARY REHAB OTHER RESP ORIENTATION from 03/04/2017 in [place]  Referring Provider  Dr. Chase Caller      Encounter Date: 03/28/2017  Check In: Session Check In - 03/28/17 1342      Check-In   Location  [facility]    Staff Present  Diane Angelina Pih, MS, EP, [facility], Exercise Physiologist;Kahlil Cowans Luther Parody, BS, EP, Exercise Physiologist    Supervising physician immediately available to respond to emergencies  See telemetry face sheet for immediately available MD    Medication changes reported      No    Fall or balance concerns reported     No    Warm-up and Cool-down  Performed as group-led instruction    Resistance Training Performed  Yes    VAD Patient?  No      Pain Assessment   Currently in Pain?  No/denies    Pain Score  0-No pain    Multiple Pain Sites  No       Capillary Blood Glucose: No results found for this or any previous visit (from the past 24 hour(s)).    Social History   Tobacco Use  Smoking Status Never Smoker  Smokeless Tobacco Never Used    Goals Met:  Independence with exercise equipment Improved SOB with ADL's Using PLB without cueing & demonstrates good technique Exercise tolerated well No report of cardiac concerns or symptoms Strength training completed today  Goals Unmet:  Not Applicable  Comments: Check out 230   Dr. Sinda Du is Medical Director for [facility] Pulmonary Rehab.

## 2017-04-02 ENCOUNTER — Encounter (HOSPITAL_COMMUNITY): Payer: BLUE CROSS/BLUE SHIELD

## 2017-04-04 ENCOUNTER — Encounter (HOSPITAL_COMMUNITY)
Admission: RE | Admit: 2017-04-04 | Discharge: 2017-04-04 | Disposition: A | Discharge status: Home / Self Care | Payer: BLUE CROSS/BLUE SHIELD | Source: Ambulatory Visit | Attending: Internal Medicine | Admitting: Internal Medicine

## 2017-04-04 DIAGNOSIS — Z7982 Long term (current) use of aspirin: Secondary | ICD-10-CM | POA: Diagnosis not present

## 2017-04-04 DIAGNOSIS — Z79899 Other long term (current) drug therapy: Secondary | ICD-10-CM | POA: Diagnosis not present

## 2017-04-04 DIAGNOSIS — R0609 Other forms of dyspnea: Secondary | ICD-10-CM | POA: Insufficient documentation

## 2017-04-04 DIAGNOSIS — K900 Celiac disease: Secondary | ICD-10-CM | POA: Diagnosis not present

## 2017-04-04 DIAGNOSIS — K219 Gastro-esophageal reflux disease without esophagitis: Secondary | ICD-10-CM | POA: Insufficient documentation

## 2017-04-04 DIAGNOSIS — I10 Essential (primary) hypertension: Secondary | ICD-10-CM | POA: Insufficient documentation

## 2017-04-04 NOTE — Progress Notes (Signed)
Daily Session Note  Patient Details  Name: Jordan Weaver MRN: [id number] Date of Birth: 08/27/1955 Referring Provider:     PULMONARY REHAB OTHER RESP ORIENTATION from 03/04/2017 in [place] [facility]  Referring Provider  Dr. Ramaswamy      Encounter Date: 04/04/2017  Check In: Session Check In - 04/04/17 1356      Check-In   Location  [facility]    Staff Present  Austine Kelsay, BS, EP, Exercise Physiologist;Debra Johnson, RN, BSN    Supervising physician immediately available to respond to emergencies  See telemetry face sheet for immediately available MD    Medication changes reported      No    Fall or balance concerns reported     No    Warm-up and Cool-down  Performed as group-led instruction    Resistance Training Performed  Yes    VAD Patient?  No      Pain Assessment   Currently in Pain?  No/denies    Pain Score  0-No pain    Multiple Pain Sites  No       Capillary Blood Glucose: No results found for this or any previous visit (from the past 24 hour(s)).  Exercise Prescription Changes - 04/04/17 0700      Response to Exercise   Blood Pressure (Admit)  120/70    Blood Pressure (Exercise)  112/70    Blood Pressure (Exit)  110/70    Heart Rate (Admit)  110 bpm    Heart Rate (Exercise)  124 bpm    Heart Rate (Exit)  111 bpm    Oxygen Saturation (Admit)  93 %    Oxygen Saturation (Exercise)  90 %    Oxygen Saturation (Exit)  93 %    Rating of Perceived Exertion (Exercise)  9    Perceived Dyspnea (Exercise)  9    Duration  Progress to 30 minutes of  aerobic without signs/symptoms of physical distress    Intensity  THRR New [phone]      Progression   Progression  Continue to progress workloads to maintain intensity without signs/symptoms of physical distress.      Resistance Training   Training Prescription  Yes    Weight  2    Reps  10-15      Treadmill   MPH  2.2    Grade  0    Minutes  15    METs  2.6      Recumbant  Elliptical   Level  1    RPM  67    Watts  92    Minutes  20    METs  5.4      Home Exercise Plan   Plans to continue exercise at  Home (comment)    Frequency  Add 2 additional days to program exercise sessions.    Initial Home Exercises Provided  03/19/17       Social History   Tobacco Use  Smoking Status Never Smoker  Smokeless Tobacco Never Used    Goals Met:  Independence with exercise equipment Improved SOB with ADL's Using PLB without cueing & demonstrates good technique Exercise tolerated well No report of cardiac concerns or symptoms Strength training completed today  Goals Unmet:  Not Applicable  Comments: Check out 230   Dr. Edward Hawkins is Medical Director for  Pulmonary Rehab. 

## 2017-04-09 ENCOUNTER — Encounter (HOSPITAL_COMMUNITY): Payer: BLUE CROSS/BLUE SHIELD

## 2017-04-11 ENCOUNTER — Encounter (HOSPITAL_COMMUNITY)
Admission: RE | Admit: 2017-04-11 | Discharge: 2017-04-11 | Disposition: A | Discharge status: Home / Self Care | Payer: BLUE CROSS/BLUE SHIELD | Source: Ambulatory Visit | Attending: Internal Medicine | Admitting: Internal Medicine

## 2017-04-11 DIAGNOSIS — R0609 Other forms of dyspnea: Secondary | ICD-10-CM | POA: Diagnosis not present

## 2017-04-11 NOTE — Progress Notes (Signed)
Daily Session Note  Patient Details  Name: ALAZE GARVERICK MRN: [id number] Date of Birth: 08/04/1955 Referring Provider:     PULMONARY REHAB OTHER RESP ORIENTATION from 03/04/2017 in [place]  Referring Provider  Dr. Chase Caller      Encounter Date: 04/11/2017  Check In: Session Check In - 04/11/17 1349      Check-In   Location  [facility]    Staff Present  Suzanne Boron, BS, EP, Exercise Physiologist;Debra Wynetta Emery, RN, BSN    Supervising physician immediately available to respond to emergencies  See telemetry face sheet for immediately available MD    Medication changes reported      No    Fall or balance concerns reported     No    Warm-up and Cool-down  Performed as group-led instruction    Resistance Training Performed  Yes    VAD Patient?  No      Pain Assessment   Currently in Pain?  No/denies    Pain Score  0-No pain    Multiple Pain Sites  No       Capillary Blood Glucose: No results found for this or any previous visit (from the past 24 hour(s)).    Social History   Tobacco Use  Smoking Status Never Smoker  Smokeless Tobacco Never Used    Goals Met:  Independence with exercise equipment Improved SOB with ADL's Using PLB without cueing & demonstrates good technique Exercise tolerated well No report of cardiac concerns or symptoms Strength training completed today  Goals Unmet:  Not Applicable  Comments: Check out 230   Dr. Sinda Du is Medical Director for [facility].

## 2017-04-16 ENCOUNTER — Encounter (HOSPITAL_COMMUNITY)
Admission: RE | Admit: 2017-04-16 | Discharge: 2017-04-16 | Disposition: A | Discharge status: Home / Self Care | Payer: BLUE CROSS/BLUE SHIELD | Source: Ambulatory Visit | Attending: Internal Medicine | Admitting: Internal Medicine

## 2017-04-16 DIAGNOSIS — R0609 Other forms of dyspnea: Principal | ICD-10-CM

## 2017-04-16 NOTE — Progress Notes (Signed)
Daily Session Note  Patient Details  Name: NATIKA GEYER MRN: [id number] Date of Birth: 1955/11/01 Referring Provider:     PULMONARY REHAB OTHER RESP ORIENTATION from 03/04/2017 in [place]  Referring Provider  Dr. Chase Caller      Encounter Date: 04/16/2017  Check In: Session Check In - 04/16/17 1322      Check-In   Location  [facility]    Staff Present  Diane Angelina Pih, MS, EP, [facility], Exercise Physiologist;Christofer Shen Luther Parody, BS, EP, Exercise Physiologist    Supervising physician immediately available to respond to emergencies  See telemetry face sheet for immediately available MD    Medication changes reported      No    Fall or balance concerns reported     No    Warm-up and Cool-down  Performed as group-led instruction    Resistance Training Performed  Yes    VAD Patient?  No      Pain Assessment   Currently in Pain?  No/denies    Pain Score  0-No pain    Multiple Pain Sites  No       Capillary Blood Glucose: No results found for this or any previous visit (from the past 24 hour(s)).    Social History   Tobacco Use  Smoking Status Never Smoker  Smokeless Tobacco Never Used    Goals Met:  Independence with exercise equipment Improved SOB with ADL's Using PLB without cueing & demonstrates good technique Exercise tolerated well No report of cardiac concerns or symptoms Strength training completed today  Goals Unmet:  Not Applicable  Comments: Check out 230   Dr. Sinda Du is Medical Director for [facility].

## 2017-04-18 ENCOUNTER — Encounter (HOSPITAL_COMMUNITY)
Admission: RE | Admit: 2017-04-18 | Discharge: 2017-04-18 | Disposition: A | Discharge status: Home / Self Care | Payer: BLUE CROSS/BLUE SHIELD | Source: Ambulatory Visit | Attending: Internal Medicine | Admitting: Internal Medicine

## 2017-04-18 DIAGNOSIS — R0609 Other forms of dyspnea: Principal | ICD-10-CM

## 2017-04-18 NOTE — Progress Notes (Signed)
Daily Session Note  Patient Details  Name: Jordan Weaver MRN: [id number] Date of Birth: [birth year] Referring Provider:     PULMONARY REHAB OTHER RESP ORIENTATION from 03/04/2017 in [place]  Referring Provider  Dr. Chase Caller      Encounter Date: 04/18/2017  Check In: Session Check In - 04/18/17 1324      Check-In   Location  [facility]    Staff Present  Diane Angelina Pih, MS, EP, [facility], Exercise Physiologist;Myalee Stengel Luther Parody, BS, EP, Exercise Physiologist;Debra Wynetta Emery, RN, BSN    Supervising physician immediately available to respond to emergencies  See telemetry face sheet for immediately available MD    Medication changes reported      No    Fall or balance concerns reported     No    Warm-up and Cool-down  Performed as group-led instruction    Resistance Training Performed  Yes    VAD Patient?  No      Pain Assessment   Currently in Pain?  No/denies    Pain Score  0-No pain    Multiple Pain Sites  No       Capillary Blood Glucose: No results found for this or any previous visit (from the past 24 hour(s)).    Social History   Tobacco Use  Smoking Status Never Smoker  Smokeless Tobacco Never Used    Goals Met:  Independence with exercise equipment Exercise tolerated well No report of cardiac concerns or symptoms Strength training completed today  Goals Unmet:  Not Applicable  Comments: Check out 230   Dr. Sinda Du is Medical Director for [facility].

## 2017-04-18 NOTE — Progress Notes (Signed)
Pulmonary Individual Treatment Plan  Patient Details  Name: Jordan Weaver MRN: [id number] Date of Birth: 09-09-1955 Referring Provider:     PULMONARY REHAB OTHER RESP ORIENTATION from 03/04/2017 in [facility]  Referring Provider  Dr. Chase Caller      Initial Encounter Date:    [facility] from 03/04/2017 in [facility]  Date  03/04/17  Referring Provider  Dr. Chase Caller      Visit Diagnosis: Dyspnea on exertion  Patient's Home Medications on Admission:   Current Outpatient Medications:  .  aspirin 81 MG tablet, Take 81 mg by mouth daily., Disp: , Rfl:  .  benazepril (LOTENSIN) 10 MG tablet, 10 mg daily. , Disp: , Rfl: 3 .  betamethasone dipropionate (DIPROLENE) 0.05 % cream, Apply topically 2 (two) times daily., Disp: 30 g, Rfl: 4 .  DULoxetine (CYMBALTA) 30 MG capsule, Take 30 mg by mouth 2 (two) times daily. , Disp: , Rfl:  .  furosemide (LASIX) 40 MG tablet, Take 1 tablet (40 mg total) daily by mouth., Disp: 60 tablet, Rfl: 3 .  gabapentin (NEURONTIN) 100 MG capsule, Take 100 mg by mouth 2 (two) times daily. , Disp: , Rfl:  .  omeprazole (PRILOSEC) 20 MG capsule, Take 20 mg by mouth daily., Disp: , Rfl:  .  Potassium Chloride ER 20 MEQ TBCR, Take 20 mEq daily by mouth., Disp: 60 tablet, Rfl: 3 .  spironolactone (ALDACTONE) 25 MG tablet, Take 25 mg by mouth daily. , Disp: , Rfl:   Past Medical History: Past Medical History:  Diagnosis Date  . Celiac disease   . GERD (gastroesophageal reflux disease)   . Hypertension     Tobacco Use: Social History   Tobacco Use  Smoking Status Never Smoker  Smokeless Tobacco Never Used    Labs: Recent Review Flowsheet Data    There is no flowsheet data to display.      Capillary Blood Glucose: No results found for: GLUCAP   Pulmonary Assessment Scores: Pulmonary Assessment Scores    Row Name 03/04/17 0954         ADL UCSD   ADL Phase  Entry     SOB  Score total  70     Rest  0     Walk  8     Stairs  4     Bath  4     Dress  3     Shop  2       CAT Score   CAT Score  17       mMRC Score   mMRC Score  3        Pulmonary Function Assessment:   Exercise Target Goals:    Exercise Program Goal: Individual exercise prescription set using results from initial 6 min walk test and THRR while considering  patient's activity barriers and safety.   Exercise Prescription Goal: Initial exercise prescription builds to 30-45 minutes a day of aerobic activity, 2-3 days per week.  Home exercise guidelines will be given to patient during program as part of exercise prescription that the participant will acknowledge.  Activity Barriers & Risk Stratification: Activity Barriers & Cardiac Risk Stratification - 03/04/17 1005      Activity Barriers & Cardiac Risk Stratification   Activity Barriers  Back Problems;Joint Problems;Shortness of Breath;Chest Pain/Angina    Cardiac Risk Stratification  Low       6 Minute Walk: 6 Minute Walk    Row Name  03/04/17 1004         6 Minute Walk   Phase  Initial     Distance  1250 feet     Distance % Change  0 %     Distance Feet Change  0 ft     Walk Time  6 minutes     # of Rest Breaks  0     MPH  2.36     METS  2.81     RPE  10     Perceived Dyspnea   13     VO2 Peak  11.47     Symptoms  Yes (comment)     Comments  Chest discomfort at exertion     Resting HR  90 bpm     Resting BP  108/82     Resting Oxygen Saturation   95 %     Exercise Oxygen Saturation  during 6 min walk  90 %     Max Ex. HR  96 bpm     Max Ex. BP  126/80     2 Minute Post BP  110/84        Oxygen Initial Assessment: Oxygen Initial Assessment - 03/04/17 1016      Home Oxygen   Home Oxygen Device  None    Sleep Oxygen Prescription  None    Home Exercise Oxygen Prescription  None    Home at Rest Exercise Oxygen Prescription  None      Initial 6 min Walk   Oxygen Used  None      Program Oxygen  Prescription   Program Oxygen Prescription  None       Oxygen Re-Evaluation: Oxygen Re-Evaluation    Row Name 04/18/17 1509             Program Oxygen Prescription   Program Oxygen Prescription  None         Home Oxygen   Home Oxygen Device  None       Sleep Oxygen Prescription  None       Home Exercise Oxygen Prescription  None       Home at Rest Exercise Oxygen Prescription  None         Goals/Expected Outcomes   Short Term Goals  To learn and understand importance of monitoring SPO2 with pulse oximeter and demonstrate accurate use of the pulse oximeter.;To learn and understand importance of maintaining oxygen saturations>88%;To learn and demonstrate proper pursed lip breathing techniques or other breathing techniques.       Long  Term Goals  Verbalizes importance of monitoring SPO2 with pulse oximeter and return demonstration;Maintenance of O2 saturations>88%;Exhibits proper breathing techniques, such as pursed lip breathing or other method taught during program session       Comments  Patient verbalizes importance of monitoring O2 sat and demonstrates proper use of pulse oximeter. She also verbalizes importance of maintaining O2 sat >88. She demonstrates proper pursed lip breathing technique. Will continue to monitor.        Goals/Expected Outcomes  Patient will continue to meet her short and long term goals in the program and after discharge.           Oxygen Discharge (Final Oxygen Re-Evaluation): Oxygen Re-Evaluation - 04/18/17 1509      Program Oxygen Prescription   Program Oxygen Prescription  None      Home Oxygen   Home Oxygen Device  None    Sleep Oxygen Prescription  None    Home  Exercise Oxygen Prescription  None    Home at Rest Exercise Oxygen Prescription  None      Goals/Expected Outcomes   Short Term Goals  To learn and understand importance of monitoring SPO2 with pulse oximeter and demonstrate accurate use of the pulse oximeter.;To learn and understand  importance of maintaining oxygen saturations>88%;To learn and demonstrate proper pursed lip breathing techniques or other breathing techniques.    Long  Term Goals  Verbalizes importance of monitoring SPO2 with pulse oximeter and return demonstration;Maintenance of O2 saturations>88%;Exhibits proper breathing techniques, such as pursed lip breathing or other method taught during program session    Comments  Patient verbalizes importance of monitoring O2 sat and demonstrates proper use of pulse oximeter. She also verbalizes importance of maintaining O2 sat >88. She demonstrates proper pursed lip breathing technique. Will continue to monitor.     Goals/Expected Outcomes  Patient will continue to meet her short and long term goals in the program and after discharge.        Initial Exercise Prescription: Initial Exercise Prescription - 03/04/17 1000      Date of Initial Exercise RX and Referring Provider   Date  03/04/17    Referring Provider  Dr. Chase Caller      Treadmill   MPH  1.3    Grade  0    Minutes  15    METs  1.9      NuStep   Level  2    SPM  81    Minutes  20    METs  1.9      Prescription Details   Frequency (times per week)  2    Duration  Progress to 30 minutes of continuous aerobic without signs/symptoms of physical distress      Intensity   THRR 40-80% of Max Heartrate  [phone]    Ratings of Perceived Exertion  11-13    Perceived Dyspnea  0-4      Progression   Progression  Continue progressive overload as per policy without signs/symptoms or physical distress.      Resistance Training   Training Prescription  Yes    Weight  1    Reps  10-15       Perform Capillary Blood Glucose checks as needed.  Exercise Prescription Changes:  Exercise Prescription Changes    Row Name 03/19/17 1500 04/04/17 0700 04/12/17 1400         Response to Exercise   Blood Pressure (Admit)  120/72  120/70  118/72     Blood Pressure (Exercise)  136/72  112/70  122/70      Blood Pressure (Exit)  126/72  110/70  116/60     Heart Rate (Admit)  95 bpm  110 bpm  97 bpm     Heart Rate (Exercise)  96 bpm  124 bpm  106 bpm     Heart Rate (Exit)  90 bpm  111 bpm  82 bpm     Oxygen Saturation (Admit)  95 %  93 %  94 %     Oxygen Saturation (Exercise)  93 %  90 %  93 %     Oxygen Saturation (Exit)  96 %  93 %  96 %     Rating of Perceived Exertion (Exercise)  11  9  9      Perceived Dyspnea (Exercise)  10  9  9      Duration  Progress to 30 minutes of  aerobic without signs/symptoms of physical  distress  Progress to 30 minutes of  aerobic without signs/symptoms of physical distress  Progress to 30 minutes of  aerobic without signs/symptoms of physical distress     Intensity  THRR New [phone]  THRR New [phone]  THRR New [phone]       Progression   Progression  Continue to progress workloads to maintain intensity without signs/symptoms of physical distress.  Continue to progress workloads to maintain intensity without signs/symptoms of physical distress.  Continue to progress workloads to maintain intensity without signs/symptoms of physical distress.       Resistance Training   Training Prescription  Yes  Yes  Yes     Weight  1  2  2      Reps  10-15  10-15  10-15       Treadmill   MPH  1.8  2.2  2.4     Grade  0  0  0     Minutes  15  15  15      METs  2.37  2.6  2.8       NuStep   Level  2  -  -     SPM  106  -  -     Minutes  20  -  -     METs  2.2  -  -       Recumbant Elliptical   Level  -  1  2     RPM  -  67  61     Watts  -  92  68     Minutes  -  20  20     METs  -  5.4  4.7       Home Exercise Plan   Plans to continue exercise at  Home (comment)  Home (comment)  Home (comment)     Frequency  Add 2 additional days to program exercise sessions.  Add 2 additional days to program exercise sessions.  Add 2 additional days to program exercise sessions.     Initial Home Exercises Provided  03/19/17  03/19/17  03/19/17        Exercise  Comments:  Exercise Comments    Row Name 03/19/17 1506 04/04/17 0752 04/12/17 1449       Exercise Comments  Patient is doing very well while in PR. She has only recently started but has progressed on the treadmill and has very high SPms on the Nustep and is being considered to be moved to the recumbent elliptical.   Patient is doing well in PR. She has increased her speed on the treadmill and has been changed to the recumbent elliptical for more of a challenge. She states that she is getting more energy from th eprogram and is increasing her endurance.   Patient is doing well in PR. She has increased her levels on both the treadmill and the recumbent elliptical. She states that she feels that she is getting stronger and has increased some endurance since she has been here.         Exercise Goals and Review:  Exercise Goals    Row Name 03/04/17 1006             Exercise Goals   Increase Physical Activity  Yes       Intervention  Provide advice, education, support and counseling about physical activity/exercise needs.;Develop an individualized exercise prescription for aerobic and resistive training based on initial evaluation findings, risk stratification, comorbidities and participant's  personal goals.       Expected Outcomes  Achievement of increased cardiorespiratory fitness and enhanced flexibility, muscular endurance and strength shown through measurements of functional capacity and personal statement of participant.       Increase Strength and Stamina  Yes       Intervention  Develop an individualized exercise prescription for aerobic and resistive training based on initial evaluation findings, risk stratification, comorbidities and participant's personal goals.;Provide advice, education, support and counseling about physical activity/exercise needs.       Expected Outcomes  Achievement of increased cardiorespiratory fitness and enhanced flexibility, muscular endurance and strength  shown through measurements of functional capacity and personal statement of participant.       Able to understand and use rate of perceived exertion (RPE) scale  Yes       Intervention  Provide education and explanation on how to use RPE scale       Expected Outcomes  Short Term: Able to use RPE daily in rehab to express subjective intensity level;Long Term:  Able to use RPE to guide intensity level when exercising independently       Able to understand and use Dyspnea scale  Yes       Intervention  Provide education and explanation on how to use Dyspnea scale       Expected Outcomes  Short Term: Able to use Dyspnea scale daily in rehab to express subjective sense of shortness of breath during exertion;Long Term: Able to use Dyspnea scale to guide intensity level when exercising independently       Knowledge and understanding of Target Heart Rate Range (THRR)  Yes       Intervention  Provide education and explanation of THRR including how the numbers were predicted and where they are located for reference       Expected Outcomes  Short Term: Able to state/look up THRR       Able to check pulse independently  Yes       Intervention  Provide education and demonstration on how to check pulse in carotid and radial arteries.;Review the importance of being able to check your own pulse for safety during independent exercise       Expected Outcomes  Short Term: Able to explain why pulse checking is important during independent exercise;Long Term: Able to check pulse independently and accurately       Understanding of Exercise Prescription  Yes       Intervention  Provide education, explanation, and written materials on patient's individual exercise prescription       Expected Outcomes  Short Term: Able to explain program exercise prescription;Long Term: Able to explain home exercise prescription to exercise independently          Exercise Goals Re-Evaluation : Exercise Goals Re-Evaluation    Row Name  03/19/17 1504 04/12/17 1448           Exercise Goal Re-Evaluation   Exercise Goals Review  Increase Physical Activity;Increase Strength and Stamina;Knowledge and understanding of Target Heart Rate Range (THRR)  Increase Physical Activity;Increase Strength and Stamina;Knowledge and understanding of Target Heart Rate Range (THRR)      Comments  Patient is doing very well while in PR. She has only recently started but has progressed on the treadmill and has very high SPms on the Nustep and is being considered to be moved to the recumbent elliptical.   Patient is doing well in PR. She has increased her levels on both the treadmill  and the recumbent elliptical. She states that she feels that she is getting stronger and has increased some endurance since she has been here.       Expected Outcomes  Pastient wishes to breathe better and to get back to normal activites.   Patient wishes to be able to breathe better and to get back to normal activities.          Discharge Exercise Prescription (Final Exercise Prescription Changes): Exercise Prescription Changes - 04/12/17 1400      Response to Exercise   Blood Pressure (Admit)  118/72    Blood Pressure (Exercise)  122/70    Blood Pressure (Exit)  116/60    Heart Rate (Admit)  97 bpm    Heart Rate (Exercise)  106 bpm    Heart Rate (Exit)  82 bpm    Oxygen Saturation (Admit)  94 %    Oxygen Saturation (Exercise)  93 %    Oxygen Saturation (Exit)  96 %    Rating of Perceived Exertion (Exercise)  9    Perceived Dyspnea (Exercise)  9    Duration  Progress to 30 minutes of  aerobic without signs/symptoms of physical distress    Intensity  THRR New [phone]      Progression   Progression  Continue to progress workloads to maintain intensity without signs/symptoms of physical distress.      Resistance Training   Training Prescription  Yes    Weight  2    Reps  10-15      Treadmill   MPH  2.4    Grade  0    Minutes  15    METs  2.8       Recumbant Elliptical   Level  2    RPM  61    Watts  68    Minutes  20    METs  4.7      Home Exercise Plan   Plans to continue exercise at  Home (comment)    Frequency  Add 2 additional days to program exercise sessions.    Initial Home Exercises Provided  03/19/17       Nutrition:  Target Goals: Understanding of nutrition guidelines, daily intake of sodium <1560m, cholesterol <2065m calories 30% from fat and 7% or less from saturated fats, daily to have 5 or more servings of fruits and vegetables.  Biometrics: Pre Biometrics - 03/04/17 1007      Pre Biometrics   Height  5' 7"  (1.702 m)    Weight  164 lb 9.6 oz (74.7 kg)    Waist Circumference  34.5 inches    Hip Circumference  39 inches    Waist to Hip Ratio  0.88 %    BMI (Calculated)  25.77    Triceps Skinfold  18 mm    % Body Fat  34.6 %    Grip Strength  65.4 kg    Flexibility  0 in    Single Leg Stand  14 seconds        Nutrition Therapy Plan and Nutrition Goals: Nutrition Therapy & Goals - 04/04/17 1304      Personal Nutrition Goals   Nutrition Goal  For heart healthy choices add >50% of whole grains, make half their plate fruits and vegetables. Discuss the difference between starchy vegetables and leafy greens, and how leafy vegetables provide fiber, helps maintain healthy weight, helps control blood glucose, and lowers cholesterol.  Discuss purchasing fresh or frozen vegetable to reduce sodium  and not to add grease, fat or sugar. Consume <18oz of red meat per week. Consume lean cuts of meats and very little of meats high in sodium and nitrates such as pork and lunch meats. Discussed portion control for all food groups      Intervention Plan   Intervention  Nutrition handout(s) given to patient.    Expected Outcomes  Short Term Goal: Understand basic principles of dietary content, such as calories, fat, sodium, cholesterol and nutrients.;Short Term Goal: A plan has been developed with personal nutrition goals  set during dietitian appointment.       Nutrition Assessments: Nutrition Assessments - 03/04/17 1018      MEDFICTS Scores   Pre Score  39       Nutrition Goals Re-Evaluation: Nutrition Goals Re-Evaluation    Row Name 04/18/17 1514             Goals   Current Weight  163 lb (73.9 kg)       Nutrition Goal  For heart healthy choices add >50% of whole grains, make half their plate fruits and vegetables. Discuss the difference between starchy vegetables and leafy greens, and how leafy vegetables provide fiber, helps maintain healthy weight, helps control blood glucose, and lowers cholesterol.  Discuss purchasing fresh or frozen vegetable to reduce sodium and not to add grease, fat or sugar. Consume <18oz of red meat per week. Consume lean cuts of meats and very little of meats high in sodium and nitrates such as pork and lunch meats. Discussed portion control for all food groups       Comment  Patient has lost 1 lb since last 30 day review. She says she feels she is meeting her nutritional goals. She is trying to eat more fruits and vegatables and smaller portions.        Expected Outcome  Patient will meet her nutritional goals at discharge.          Personal Goal #2 Re-Evaluation   Personal Goal #2  Patient is eating a heart healthy diet.           Nutrition Goals Discharge (Final Nutrition Goals Re-Evaluation): Nutrition Goals Re-Evaluation - 04/18/17 1514      Goals   Current Weight  163 lb (73.9 kg)    Nutrition Goal  For heart healthy choices add >50% of whole grains, make half their plate fruits and vegetables. Discuss the difference between starchy vegetables and leafy greens, and how leafy vegetables provide fiber, helps maintain healthy weight, helps control blood glucose, and lowers cholesterol.  Discuss purchasing fresh or frozen vegetable to reduce sodium and not to add grease, fat or sugar. Consume <18oz of red meat per week. Consume lean cuts of meats and very little of  meats high in sodium and nitrates such as pork and lunch meats. Discussed portion control for all food groups    Comment  Patient has lost 1 lb since last 30 day review. She says she feels she is meeting her nutritional goals. She is trying to eat more fruits and vegatables and smaller portions.     Expected Outcome  Patient will meet her nutritional goals at discharge.       Personal Goal #2 Re-Evaluation   Personal Goal #2  Patient is eating a heart healthy diet.        Psychosocial: Target Goals: Acknowledge presence or absence of significant depression and/or stress, maximize coping skills, provide positive support system. Participant is able to verbalize types  and ability to use techniques and skills needed for reducing stress and depression.  Initial Review & Psychosocial Screening: Initial Psych Review & Screening - 03/04/17 1022      Initial Review   Current issues with  None Identified      Family Dynamics   Good Support System?  Yes      Barriers   Psychosocial barriers to participate in program  There are no identifiable barriers or psychosocial needs. QOL score 20.47      Screening Interventions   Interventions  Encouraged to exercise       Quality of Life Scores: Quality of Life - 03/04/17 1008      Quality of Life Scores   Health/Function Pre  15.5 %    Socioeconomic Pre  24.36 %    Psych/Spiritual Pre  23.36 %    Family Pre  28.5 %    GLOBAL Pre  20.47 %      Scores of 19 and below usually indicate a poorer quality of life in these areas.  A difference of  2-3 points is a clinically meaningful difference.  A difference of 2-3 points in the total score of the Quality of Life Index has been associated with significant improvement in overall quality of life, self-image, physical symptoms, and general health in studies assessing change in quality of life.   PHQ-9: Recent Review Flowsheet Data    Depression screen [facility] 2/9 03/04/2017   Decreased Interest 0    Down, Depressed, Hopeless 0   PHQ - 2 Score 0   Altered sleeping 0   Tired, decreased energy 1   Change in appetite 0   Feeling bad or failure about yourself  0   Trouble concentrating 0   Moving slowly or fidgety/restless 0   Suicidal thoughts 0   PHQ-9 Score 1   Difficult doing work/chores Somewhat difficult     Interpretation of Total Score  Total Score Depression Severity:  1-4 = Minimal depression, 5-9 = Mild depression, 10-14 = Moderate depression, 15-19 = Moderately severe depression, 20-27 = Severe depression   Psychosocial Evaluation and Intervention: Psychosocial Evaluation - 03/04/17 1023      Psychosocial Evaluation & Interventions   Interventions  Encouraged to exercise with the program and follow exercise prescription    Continue Psychosocial Services   No Follow up required       Psychosocial Re-Evaluation: Psychosocial Re-Evaluation    [place] Name 04/18/17 1519             Psychosocial Re-Evaluation   Current issues with  None Identified       Comments  Patient's initial QOL score was 20.47 and her PHQ-9 score was 1. No psychosocial barriers identified.        Expected Outcomes  Patient will have no psychosocial barriers identified at discharge and her QOL and PHQ-9 scores will improve.       Interventions  Relaxation education;Encouraged to attend Pulmonary Rehabilitation for the exercise;Stress management education       Continue Psychosocial Services   No Follow up required          Psychosocial Discharge (Final Psychosocial Re-Evaluation): Psychosocial Re-Evaluation - 04/18/17 1519      Psychosocial Re-Evaluation   Current issues with  None Identified    Comments  Patient's initial QOL score was 20.47 and her PHQ-9 score was 1. No psychosocial barriers identified.     Expected Outcomes  Patient will have no psychosocial barriers identified at discharge and  her QOL and PHQ-9 scores will improve.    Interventions  Relaxation education;Encouraged to  attend Pulmonary Rehabilitation for the exercise;Stress management education    Continue Psychosocial Services   No Follow up required        Education: Education Goals: Education classes will be provided on a weekly basis, covering required topics. Participant will state understanding/return demonstration of topics presented.  Learning Barriers/Preferences: Learning Barriers/Preferences - 03/04/17 6283      Learning Barriers/Preferences   Learning Barriers  None    Learning Preferences  Written Material;Pictoral       Education Topics: How Lungs Work and Diseases: - Discuss the anatomy of the lungs and diseases that can affect the lungs, such as COPD.   Exercise: -Discuss the importance of exercise, FITT principles of exercise, normal and abnormal responses to exercise, and how to exercise safely.   Environmental Irritants: -Discuss types of environmental irritants and how to limit exposure to environmental irritants.   Meds/Inhalers and oxygen: - Discuss respiratory medications, definition of an inhaler and oxygen, and the proper way to use an inhaler and oxygen.   Energy Saving Techniques: - Discuss methods to conserve energy and decrease shortness of breath when performing activities of daily living.    Bronchial Hygiene / Breathing Techniques: - Discuss breathing mechanics, pursed-lip breathing technique,  proper posture, effective ways to clear airways, and other functional breathing techniques   Cleaning Equipment: - Provides group verbal and written instruction about the health risks of elevated stress, cause of high stress, and healthy ways to reduce stress.   PULMONARY REHAB OTHER RESPIRATORY from 04/18/2017 in [place]  Date  03/14/17  Educator  DWynetta Emery  Instruction Review Code  2- Demonstrated Understanding      Nutrition I: Fats: - Discuss the types of cholesterol, what cholesterol does to the body, and how cholesterol  levels can be controlled.   PULMONARY REHAB OTHER RESPIRATORY from 04/18/2017 in [place]  Date  03/21/17  Educator  [place]  Instruction Review Code  2- Demonstrated Understanding      Nutrition II: Labels: -Discuss the different components of food labels and how to read food labels.   PULMONARY REHAB OTHER RESPIRATORY from 04/18/2017 in [place]  Date  03/28/17  Educator  [place]  Instruction Review Code  2- Demonstrated Understanding      Respiratory Infections: - Discuss the signs and symptoms of respiratory infections, ways to prevent respiratory infections, and the importance of seeking medical treatment when having a respiratory infection.   PULMONARY REHAB OTHER RESPIRATORY from 04/18/2017 in [place]  Date  04/04/17  Educator  [place]  Instruction Review Code  2- Demonstrated Understanding      Stress I: Signs and Symptoms: - Discuss the causes of stress, how stress may lead to anxiety and depression, and ways to limit stress.   PULMONARY REHAB OTHER RESPIRATORY from 04/18/2017 in [place]  Date  04/11/17  Educator  [place]  Instruction Review Code  2- Demonstrated Understanding      Stress II: Relaxation: -Discuss relaxation techniques to limit stress.   PULMONARY REHAB OTHER RESPIRATORY from 04/18/2017 in [place]  Date  04/18/17  Educator  [place]  Instruction Review Code  2- Demonstrated Understanding      Oxygen for Home/Travel: - Discuss how to prepare for travel when on oxygen and proper ways to transport and store oxygen to ensure safety.  Knowledge Questionnaire Score: Knowledge Questionnaire Score - 03/04/17 0954      Knowledge Questionnaire Score   Pre Score  14/18       Core Components/Risk Factors/Patient Goals at Admission: Personal Goals and Risk Factors at Admission - 03/04/17 1018      Core Components/Risk Factors/Patient Goals on  Admission    Weight Management  Weight Maintenance    Develop more efficient breathing techniques such as purse lipped breathing and diaphragmatic breathing; and practicing self-pacing with activity  Yes    Intervention  Provide education, demonstration and support about specific breathing techniuqes utilized for more efficient breathing. Include techniques such as pursed lipped breathing, diaphragmatic breathing and self-pacing activity.    Expected Outcomes  Short Term: Participant will be able to demonstrate and use breathing techniques as needed throughout daily activities.    Personal Goal Other  Yes    Personal Goal  Breath better, Be able to return to maintaining her home, taking care of husband and dog.     Intervention  Attend PR program 2 x week and supplement at home exercise 3 x week.     Expected Outcomes  Reach goals       Core Components/Risk Factors/Patient Goals Review:  Goals and Risk Factor Review    Row Name 04/18/17 1515             Core Components/Risk Factors/Patient Goals Review   Personal Goals Review  Improve shortness of breath with ADL's;Weight Management/Obesity;Develop more efficient breathing techniques such as purse lipped breathing and diaphragmatic breathing and practicing self-pacing with activity.;Other Breathe better; get back to normal activities.        Review  Patient has completed 9 sessions with progression. She says she has learned how to pace herself with activities and how to do pursed lip breathing when she gets SOB with activities. She says she notices less SOB using stairs now. She says when she is doing house chores and she becomes SOB, she stops and sits down and does pursed lip breathing until she recovers. Overall, she says she program is helping her. Will cotinue to monitor for progress.        Expected Outcomes  Patient will continue to attend sessions consistently and complete the program meeting her personal goals.           Core  Components/Risk Factors/Patient Goals at Discharge (Final Review):  Goals and Risk Factor Review - 04/18/17 1515      Core Components/Risk Factors/Patient Goals Review   Personal Goals Review  Improve shortness of breath with ADL's;Weight Management/Obesity;Develop more efficient breathing techniques such as purse lipped breathing and diaphragmatic breathing and practicing self-pacing with activity.;Other Breathe better; get back to normal activities.     Review  Patient has completed 9 sessions with progression. She says she has learned how to pace herself with activities and how to do pursed lip breathing when she gets SOB with activities. She says she notices less SOB using stairs now. She says when she is doing house chores and she becomes SOB, she stops and sits down and does pursed lip breathing until she recovers. Overall, she says she program is helping her. Will cotinue to monitor for progress.     Expected Outcomes  Patient will continue to attend sessions consistently and complete the program meeting her personal goals.        ITP Comments: ITP Comments    Row Name 03/20/17 [phone]  ITP Comments  Patient new to program comleting 3 sessions. Will continue to monitor for progress.           Comments: ITP 30 Day REVIEW Pt is making expected progress toward pulmonary rehab goals after completing 9 sessions. Recommend continued exercise, life style modification, education, and utilization of breathing techniques to increase stamina and strength and decrease shortness of breath with exertion.

## 2017-04-23 ENCOUNTER — Encounter (HOSPITAL_COMMUNITY): Payer: BLUE CROSS/BLUE SHIELD

## 2017-04-25 ENCOUNTER — Encounter (HOSPITAL_COMMUNITY)
Admission: RE | Admit: 2017-04-25 | Discharge: 2017-04-25 | Disposition: A | Discharge status: Home / Self Care | Payer: BLUE CROSS/BLUE SHIELD | Source: Ambulatory Visit | Attending: Internal Medicine | Admitting: Internal Medicine

## 2017-04-25 DIAGNOSIS — R0609 Other forms of dyspnea: Secondary | ICD-10-CM | POA: Diagnosis not present

## 2017-04-25 NOTE — Progress Notes (Signed)
Daily Session Note  Patient Details  Name: Jordan Weaver MRN: [id number] Date of Birth: 04-08-1955 Referring Provider:     PULMONARY REHAB OTHER RESP ORIENTATION from 03/04/2017 in [place]  Referring Provider  Dr. Chase Caller      Encounter Date: 04/25/2017  Check In: Session Check In - 04/25/17 1333      Check-In   Location  [facility]    Staff Present  Diane Angelina Pih, MS, EP, [facility], Exercise Physiologist;Debra Wynetta Emery, RN, BSN;Lizzett Nobile, BS, EP, Exercise Physiologist    Supervising physician immediately available to respond to emergencies  See telemetry face sheet for immediately available MD    Medication changes reported      No    Fall or balance concerns reported     No    Warm-up and Cool-down  Performed as group-led instruction    Resistance Training Performed  Yes    VAD Patient?  No      Pain Assessment   Currently in Pain?  No/denies    Pain Score  0-No pain    Multiple Pain Sites  No       Capillary Blood Glucose: No results found for this or any previous visit (from the past 24 hour(s)).    Social History   Tobacco Use  Smoking Status Never Smoker  Smokeless Tobacco Never Used    Goals Met:  Independence with exercise equipment Improved SOB with ADL's Using PLB without cueing & demonstrates good technique Exercise tolerated well No report of cardiac concerns or symptoms Strength training completed today  Goals Unmet:  Not Applicable  Comments: Check out 230   Dr. Sinda Du is Medical Director for [facility] Pul[facility].

## 2017-04-30 ENCOUNTER — Encounter (HOSPITAL_COMMUNITY)
Admission: RE | Admit: 2017-04-30 | Discharge: 2017-04-30 | Disposition: A | Discharge status: Home / Self Care | Payer: BLUE CROSS/BLUE SHIELD | Source: Ambulatory Visit | Attending: Internal Medicine | Admitting: Internal Medicine

## 2017-04-30 DIAGNOSIS — R0609 Other forms of dyspnea: Principal | ICD-10-CM

## 2017-04-30 NOTE — Progress Notes (Signed)
Daily Session Note  Patient Details  Name: Jordan Weaver MRN: [id number] Date of Birth: 1955-12-19 Referring Provider:     PULMONARY REHAB OTHER RESP ORIENTATION from 03/04/2017 in [place]  Referring Provider  Dr. Chase Caller      Encounter Date: 04/30/2017  Check In: Session Check In - 04/30/17 1331      Check-In   Location  [facility]    Staff Present  Diane Angelina Pih, MS, EP, [facility], Exercise Physiologist;Corbyn Steedman Luther Parody, BS, EP, Exercise Physiologist    Supervising physician immediately available to respond to emergencies  See telemetry face sheet for immediately available MD    Medication changes reported      No    Fall or balance concerns reported     No    Warm-up and Cool-down  Performed as group-led instruction    Resistance Training Performed  Yes    VAD Patient?  No      Pain Assessment   Currently in Pain?  No/denies    Pain Score  0-No pain    Multiple Pain Sites  No       Capillary Blood Glucose: No results found for this or any previous visit (from the past 24 hour(s)).    Social History   Tobacco Use  Smoking Status Never Smoker  Smokeless Tobacco Never Used    Goals Met:  Independence with exercise equipment Improved SOB with ADL's Using PLB without cueing & demonstrates good technique Exercise tolerated well No report of cardiac concerns or symptoms Strength training completed today  Goals Unmet:  Not Applicable  Comments: Check out 230   Dr. Sinda Du is Medical Director for [facility].

## 2017-05-02 ENCOUNTER — Encounter (HOSPITAL_COMMUNITY)
Admission: RE | Admit: 2017-05-02 | Discharge: 2017-05-02 | Disposition: A | Discharge status: Home / Self Care | Payer: BLUE CROSS/BLUE SHIELD | Source: Ambulatory Visit | Attending: Internal Medicine | Admitting: Internal Medicine

## 2017-05-02 DIAGNOSIS — R0609 Other forms of dyspnea: Principal | ICD-10-CM

## 2017-05-02 NOTE — Progress Notes (Signed)
Daily Session Note  Patient Details  Name: Jordan Weaver MRN: [id number] Date of Birth: February 05, 1956 Referring Provider:     PULMONARY REHAB OTHER RESP ORIENTATION from 03/04/2017 in [place]  Referring Provider  Dr. Chase Caller      Encounter Date: 05/02/2017  Check In: Session Check In - 05/02/17 1401      Check-In   Location  [facility]    Staff Present  Aundra Dubin, RN, BSN;April Colter Luther Parody, BS, EP, Exercise Physiologist;Diane Coad, MS, EP, [facility], Exercise Physiologist    Supervising physician immediately available to respond to emergencies  See telemetry face sheet for immediately available MD    Medication changes reported      No    Fall or balance concerns reported     No    Warm-up and Cool-down  Performed as group-led instruction    Resistance Training Performed  Yes    VAD Patient?  No      Pain Assessment   Currently in Pain?  No/denies    Pain Score  0-No pain    Multiple Pain Sites  No       Capillary Blood Glucose: No results found for this or any previous visit (from the past 24 hour(s)).    Social History   Tobacco Use  Smoking Status Never Smoker  Smokeless Tobacco Never Used    Goals Met:  Independence with exercise equipment Improved SOB with ADL's Using PLB without cueing & demonstrates good technique Exercise tolerated well No report of cardiac concerns or symptoms Strength training completed today  Goals Unmet:  Not Applicable  Comments: Check out 230   Dr. Sinda Du is Medical Director for [facility] Pulmonary Rehab.

## 2017-05-07 ENCOUNTER — Encounter: Payer: Self-pay | Admitting: Adult Health

## 2017-05-07 ENCOUNTER — Emergency Department (HOSPITAL_COMMUNITY)
Admission: EM | Admit: 2017-05-07 | Discharge: 2017-05-07 | Disposition: A | Discharge status: Home / Self Care | Payer: BLUE CROSS/BLUE SHIELD | Attending: Emergency Medicine | Admitting: Emergency Medicine

## 2017-05-07 ENCOUNTER — Emergency Department (HOSPITAL_COMMUNITY): Payer: BLUE CROSS/BLUE SHIELD

## 2017-05-07 ENCOUNTER — Encounter (HOSPITAL_COMMUNITY): Payer: BLUE CROSS/BLUE SHIELD

## 2017-05-07 ENCOUNTER — Encounter (HOSPITAL_COMMUNITY): Payer: Self-pay

## 2017-05-07 ENCOUNTER — Other Ambulatory Visit: Payer: Self-pay

## 2017-05-07 ENCOUNTER — Ambulatory Visit (INDEPENDENT_AMBULATORY_CARE_PROVIDER_SITE_OTHER): Payer: BLUE CROSS/BLUE SHIELD | Admitting: Adult Health

## 2017-05-07 VITALS — BP 118/72 | HR 82 | Ht 67.0 in | Wt 159.6 lb

## 2017-05-07 DIAGNOSIS — R0789 Other chest pain: Secondary | ICD-10-CM | POA: Diagnosis not present

## 2017-05-07 DIAGNOSIS — R0609 Other forms of dyspnea: Secondary | ICD-10-CM | POA: Diagnosis not present

## 2017-05-07 DIAGNOSIS — R079 Chest pain, unspecified: Secondary | ICD-10-CM | POA: Insufficient documentation

## 2017-05-07 DIAGNOSIS — Z5321 Procedure and treatment not carried out due to patient leaving prior to being seen by health care provider: Secondary | ICD-10-CM | POA: Diagnosis not present

## 2017-05-07 LAB — BASIC METABOLIC PANEL
ANION GAP: 13 (ref 5–15)
BUN: 15 mg/dL (ref 6–20)
CALCIUM: 9.8 mg/dL (ref 8.9–10.3)
CO2: 26 mmol/L (ref 22–32)
Chloride: 101 mmol/L (ref 101–111)
Creatinine, Ser: 0.82 mg/dL (ref 0.44–1.00)
GFR calc non Af Amer: >60 mL/min (ref >60)
GLUCOSE: 111 mg/dL — AB (ref 65–99)
POTASSIUM: 3.9 mmol/L (ref 3.5–5.1)
SODIUM: 140 mmol/L (ref 135–145)

## 2017-05-07 LAB — CBC WITH DIFFERENTIAL/PLATELET
BASOS ABS: 0 10*3/uL (ref 0.0–0.1)
BASOS PCT: 0 %
EOS ABS: 0.3 10*3/uL (ref 0.0–0.7)
EOS PCT: 4 %
HCT: 44.4 % (ref 36.0–46.0)
Hemoglobin: 15 g/dL (ref 12.0–15.0)
LYMPHS PCT: 39 %
Lymphs Abs: 2.9 10*3/uL (ref 0.7–4.0)
MCH: 30.9 pg (ref 26.0–34.0)
MCHC: 33.8 g/dL (ref 30.0–36.0)
MCV: 91.4 fL (ref 78.0–100.0)
MONO ABS: 0.5 10*3/uL (ref 0.1–1.0)
Monocytes Relative: 6 %
Neutro Abs: 3.8 10*3/uL (ref 1.7–7.7)
Neutrophils Relative %: 51 %
PLATELETS: 303 10*3/uL (ref 150–400)
RBC: 4.86 MIL/uL (ref 3.87–5.11)
RDW: 13.3 % (ref 11.5–15.5)
WBC: 7.5 10*3/uL (ref 4.0–10.5)

## 2017-05-07 LAB — I-STAT TROPONIN, ED: Troponin i, poc: 0.01 ng/mL (ref 0.00–0.08)

## 2017-05-07 NOTE — Progress Notes (Signed)
62 y/o F PMH: HTN, GERD, SOB with exertion due to elevated right hemidiaphargm and stiff heart muscle.  Presents today with complaints of sternal chest tightness x3 days.   Chest tightness has been constant for the past 3 days, verbalizes moderate relief when lying down in bed.  Describes the pain as pressure in chest.  States she takes prilosec for her celiac disease.  Pt has completed 9 sessions of pulmonary rehab and has not noticed any difference with her exertional SOB.  Endorses SOB with exertion, nonproductive cough.  Denies wheezing, hemoptysis, edema, nausea, vomiting, or abdominal pain.  Pt Lasix dose was increased to 40 mg a day at the last ov with Dr. Chase Caller and she has been taking it as prescribed.    PE Gen: Pleasant, in no distress HEENT: Normocephalic, atraumatic, PERRLA, oropharynx is clear and moist, no oropharyngeal exudate Neck: Supple, no JVD Cardiovascular: Normal rate, regular rhythm, no murmur, mid sternal area soreness to touch, no edema Pulmonary: CTA, no wheezes or rales Abdominal: Soft, bowel sounds  Neurological: A&O x3 Skin: Warm and dry Psychiatric: normal mood and affect, behavior normal

## 2017-05-07 NOTE — Patient Instructions (Signed)
Refer to ER for chest pain . You have declined EMS transport . Please go directly to ER at [facility] .  Continue on current regimen  Follow up with Dr. Chase Caller in 3-4 months and As needed  .  Please contact office for sooner follow up if symptoms do not improve or worsen or seek emergency care

## 2017-05-07 NOTE — ED Triage Notes (Signed)
Pt presents with 4 day h/o mid-sternal chest pain.  Pt reports pain is constant and does not radiate.  Pt denies any new shortness of breath, denies any cough.

## 2017-05-07 NOTE — Assessment & Plan Note (Addendum)
Chest pain x 3 days ? Etiology  EKG w/ non specific changes . Exam is unrevealing  Pt continues to have chest pain/tightness currently . She has Family Hx of CAD . RF : Age and HTN  Dr. Chase Caller notified.   Plan  She will need to evaluated further to r/o ACS  Refer to ER at [facility] . Discussed transport to ER via EMS .  She declines transport . Advised of potential risks of driving with chest pain .  She declines EMS transport but assures me she will go directly to ER at [facility] ER triage called   Patient Instructions  Refer to ER for chest pain . You have declined EMS transport . Please go directly to ER at [facility] .  Continue on current regimen  Follow up with Dr. Chase Caller in 3-4 months and As needed  .  Please contact office for sooner follow up if symptoms do not improve or worsen or seek emergency care

## 2017-05-07 NOTE — Progress Notes (Signed)
@Patient  ID: Jordan Weaver, female    DOB: [birth year], 62 y.o.   MRN: [id number]  Chief Complaint  Patient presents with  . 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  Patient complains of 3 days of mid chest pain and tightness.  She says that she started having some tightness in the middle part of her chest that is been pretty constant over the last 3 days.  It is sore to touch.  She has no associated increased dyspnea, cough, hemoptysis or increased edema.  She is not taking any medications for relief.  She denies any radiating pains into her shoulder or back or neck.  Patient says she does have a family history of heart disease in a sister and father. EKG today shows NSR , nonspecific changes .   Patient is followed for chronic dyspnea felt secondary to a paralyzed right hemidiaphragm and diastolic heart failure.  She is currently involved in pulmonary rehab.  She says overall breathing shortness of breath are back at baseline.  She is not seeing significant improvement in her dyspnea since starting pulmonary rehab.  Does feel like that she is more conditioned.     Allergies  Allergen Reactions  . Codeine Nausea And Vomiting    Immunization History  Administered Date(s) Administered  . Influenza-Unspecified 01/31/2017    Past Medical History:  Diagnosis Date  . Celiac disease   . GERD (gastroesophageal reflux disease)   . Hypertension     Tobacco History: Social History   Tobacco Use  Smoking Status Never Smoker  Smokeless Tobacco Never Used   Counseling given: Not Answered   Outpatient Encounter Medications as of 05/07/2017  Medication Sig  . aspirin 81 MG tablet Take 81 mg by mouth daily.  .  benazepril (LOTENSIN) 10 MG tablet 10 mg daily.   . betamethasone dipropionate (DIPROLENE) 0.05 % cream Apply topically 2 (two) times daily.  . DULoxetine (CYMBALTA) 30 MG capsule Take 30 mg by mouth 2 (two) times daily.   . furosemide (LASIX) 40 MG tablet Take 1 tablet (40 mg total) daily by mouth.  . gabapentin (NEURONTIN) 100 MG capsule Take 100 mg by mouth 2 (two) times daily.   [name] omeprazole (PRILOSEC) 20 MG capsule Take 20 mg by mouth daily.  . Potassium Chloride ER 20 MEQ TBCR Take 20 mEq daily by mouth.  . spironolactone (ALDACTONE) 25 MG tablet Take 25 mg by mouth daily.    No facility-administered encounter medications on file as of 05/07/2017.      Review of Systems  Constitutional:   No  weight loss, night sweats,  Fevers, chills,  +fatigue, or  lassitude.  HEENT:   No headaches,  Difficulty swallowing,  Tooth/dental problems, or  Sore throat,                No sneezing, itching, ear ache, nasal congestion, post nasal drip,   CV:  No   Orthopnea, PND, swelling in lower extremities, anasarca, dizziness, palpitations, syncope.  + chest pain/pressure /tightness mid chest x 3 days   GI  No heartburn, indigestion, abdominal pain, nausea, vomiting, diarrhea, change in bowel habits, loss of appetite, bloody stools.   Resp:  No  excess mucus, no productive cough,  No non-productive cough,  No coughing up of blood.  No change in color of mucus.  No wheezing.  No chest wall deformity  Skin: no rash or lesions.  GU: no dysuria, change in color of urine, no urgency or frequency.  No flank pain, no hematuria   MS:  No joint pain or swelling.  No decreased range of motion.  No back pain.    Physical Exam  BP 118/72 (BP Location: Left Arm, Cuff Size: Normal)   Pulse 82   Ht 5' 7"  (1.702 m)   Wt 159 lb 9.6 oz (72.4 kg)   SpO2 97%   BMI 25.00 kg/m   GEN: A/Ox3; pleasant , NAD   HEENT:  /AT,  EACs-clear, TMs-wnl, NOSE-clear, THROAT-clear, no lesions, no postnasal drip or  exudate noted.   NECK:  Supple w/ fair ROM; no JVD; normal carotid impulses w/o bruits; no thyromegaly or nodules palpated; no lymphadenopathy.    RESP  Clear  P & A; w/o, wheezes/ rales/ or rhonchi. no accessory muscle use, no dullness to percussion Tender along the mid chest.  No bruising or deformity noted.  CARD:  RRR, no m/r/g, no peripheral edema, pulses intact, no cyanosis or clubbing. Negative calf tenderness.  Negative Homans sign.   GI:   Soft & nt; nml bowel sounds; no organomegaly or masses detected.  No guarding or rebound.  Musco: Warm bil, no deformities or joint swelling noted.    Neuro: alert, no focal deficits noted.    Skin: Warm, no lesions or rashes    Lab Results:  CBC No results found for: WBC, RBC, HGB, HCT, PLT, MCV, MCH, MCHC, RDW, LYMPHSABS, MONOABS, EOSABS, BASOSABS  BMET No results found for: NA, K, CL, CO2, GLUCOSE, BUN, CREATININE, CALCIUM, GFRNONAA, GFRAA  BNP No results found for: BNP  ProBNP No results found for: PROBNP  Imaging: No results found.   Assessment & Plan:   Dyspnea on exertion Stable felt secondary to paralyzed right hemidiaphragm , Diastolic dysfunction with mild pulmonary HTN  Cont to monitor.    Chest pain Chest pain x 3 days ? Etiology  EKG w/ non specific changes . Exam is unrevealing  Pt continues to have chest pain/tightness currently . She has Family Hx of CAD . RF : Age and HTN  Dr. Chase Caller notified.   Plan  She will need to evaluated further to r/o ACS  Refer to ER at [facility] . Discussed transport to ER via EMS .  She declines transport . Advised of potential risks of driving with chest pain .  She declines EMS transport but assures me she will go directly to ER at [facility] ER triage called   Patient Instructions  Refer to ER for chest pain . You have declined EMS transport . Please go directly to ER at [facility] .  Continue on current regimen  Follow up with Dr. Chase Caller in 3-4 months and As needed   .  Please contact office for sooner follow up if symptoms do not improve or worsen or seek emergency care           Rexene Edison, NP 05/07/2017

## 2017-05-07 NOTE — Assessment & Plan Note (Signed)
Stable felt secondary to paralyzed right hemidiaphragm , Diastolic dysfunction with mild pulmonary HTN  Cont to monitor.

## 2017-05-09 ENCOUNTER — Encounter (HOSPITAL_COMMUNITY): Payer: BLUE CROSS/BLUE SHIELD

## 2017-05-10 NOTE — ED Notes (Signed)
05/10/2017, Follow-up call completed. Pt. Very grateful for the follow-up call.

## 2017-05-14 ENCOUNTER — Encounter (HOSPITAL_COMMUNITY): Payer: BLUE CROSS/BLUE SHIELD

## 2017-05-16 ENCOUNTER — Encounter (HOSPITAL_COMMUNITY): Payer: BLUE CROSS/BLUE SHIELD

## 2017-05-16 NOTE — Progress Notes (Signed)
Pulmonary Individual Treatment Plan  Patient Details  Name: Jordan Weaver MRN: [id number] Date of Birth: 1955-09-16 Referring Provider:     PULMONARY REHAB OTHER RESP ORIENTATION from 03/04/2017 in [place]  Referring Provider  Dr. Chase Caller      Initial Encounter Date:    [facility] from 03/04/2017 in [place]  Date  03/04/17  Referring Provider  Dr. Chase Caller      Visit Diagnosis: Dyspnea on exertion  Patient's Home Medications on Admission:   Current Outpatient Medications:  .  aspirin 81 MG tablet, Take 81 mg by mouth daily., Disp: , Rfl:  .  benazepril (LOTENSIN) 10 MG tablet, 10 mg daily. , Disp: , Rfl: 3 .  betamethasone dipropionate (DIPROLENE) 0.05 % cream, Apply topically 2 (two) times daily., Disp: 30 g, Rfl: 4 .  DULoxetine (CYMBALTA) 30 MG capsule, Take 30 mg by mouth 2 (two) times daily. , Disp: , Rfl:  .  furosemide (LASIX) 40 MG tablet, Take 1 tablet (40 mg total) daily by mouth., Disp: 60 tablet, Rfl: 3 .  gabapentin (NEURONTIN) 100 MG capsule, Take 100 mg by mouth 2 (two) times daily. , Disp: , Rfl:  .  omeprazole (PRILOSEC) 20 MG capsule, Take 20 mg by mouth daily., Disp: , Rfl:  .  Potassium Chloride ER 20 MEQ TBCR, Take 20 mEq daily by mouth., Disp: 60 tablet, Rfl: 3 .  spironolactone (ALDACTONE) 25 MG tablet, Take 25 mg by mouth daily. , Disp: , Rfl:   Past Medical History: Past Medical History:  Diagnosis Date  . Celiac disease   . GERD (gastroesophageal reflux disease)   . Hypertension     Tobacco Use: Social History   Tobacco Use  Smoking Status Never Smoker  Smokeless Tobacco Never Used    Labs: Recent Review Flowsheet Data    There is no flowsheet data to display.      Capillary Blood Glucose: No results found for: GLUCAP   Pulmonary Assessment Scores: Pulmonary Assessment Scores    Row Name 03/04/17 0954         ADL [facility]SD   ADL Phase  Entry     SOB  Score total  70     Rest  0     Walk  8     Stairs  4     Bath  4     Dress  3     Shop  2       CAT Score   CAT Score  17       mMRC Score   mMRC Score  3        Pulmonary Function Assessment:   Exercise Target Goals:    Exercise Program Goal: Individual exercise prescription set using results from initial 6 min walk test and THRR while considering  patient's activity barriers and safety.   Exercise Prescription Goal: Initial exercise prescription builds to 30-45 minutes a day of aerobic activity, 2-3 days per week.  Home exercise guidelines will be given to patient during program as part of exercise prescription that the participant will acknowledge.  Activity Barriers & Risk Stratification: Activity Barriers & Cardiac Risk Stratification - 03/04/17 1005      Activity Barriers & Cardiac Risk Stratification   Activity Barriers  Back Problems;Joint Problems;Shortness of Breath;Chest Pain/Angina    Cardiac Risk Stratification  Low       6 Minute Walk: 6 Minute Walk    Row Name  03/04/17 1004         6 Minute Walk   Phase  Initial     Distance  1250 feet     Distance % Change  0 %     Distance Feet Change  0 ft     Walk Time  6 minutes     # of Rest Breaks  0     MPH  2.36     METS  2.81     RPE  10     Perceived Dyspnea   13     VO2 Peak  11.47     Symptoms  Yes (comment)     Comments  Chest discomfort at exertion     Resting HR  90 bpm     Resting BP  108/82     Resting Oxygen Saturation   95 %     Exercise Oxygen Saturation  during 6 min walk  90 %     Max Ex. HR  96 bpm     Max Ex. BP  126/80     2 Minute Post BP  110/84        Oxygen Initial Assessment: Oxygen Initial Assessment - 03/04/17 1016      Home Oxygen   Home Oxygen Device  None    Sleep Oxygen Prescription  None    Home Exercise Oxygen Prescription  None    Home at Rest Exercise Oxygen Prescription  None      Initial 6 min Walk   Oxygen Used  None      Program Oxygen  Prescription   Program Oxygen Prescription  None       Oxygen Re-Evaluation: Oxygen Re-Evaluation    Row Name 04/18/17 1509 05/16/17 1452           Program Oxygen Prescription   Program Oxygen Prescription  None  None        Home Oxygen   Home Oxygen Device  None  None      Sleep Oxygen Prescription  None  None      Home Exercise Oxygen Prescription  None  None      Home at Rest Exercise Oxygen Prescription  None  None        Goals/Expected Outcomes   Short Term Goals  To learn and understand importance of monitoring SPO2 with pulse oximeter and demonstrate accurate use of the pulse oximeter.;To learn and understand importance of maintaining oxygen saturations>88%;To learn and demonstrate proper pursed lip breathing techniques or other breathing techniques.  To learn and understand importance of monitoring SPO2 with pulse oximeter and demonstrate accurate use of the pulse oximeter.;To learn and understand importance of maintaining oxygen saturations>88%;To learn and demonstrate proper pursed lip breathing techniques or other breathing techniques.      Long  Term Goals  Verbalizes importance of monitoring SPO2 with pulse oximeter and return demonstration;Maintenance of O2 saturations>88%;Exhibits proper breathing techniques, such as pursed lip breathing or other method taught during program session  Verbalizes importance of monitoring SPO2 with pulse oximeter and return demonstration;Maintenance of O2 saturations>88%;Exhibits proper breathing techniques, such as pursed lip breathing or other method taught during program session      Comments  Patient verbalizes importance of monitoring O2 sat and demonstrates proper use of pulse oximeter. She also verbalizes importance of maintaining O2 sat >88. She demonstrates proper pursed lip breathing technique. Will continue to monitor.   Patient verbalizes importance of monitoring O2 sat and demonstrates proper use of pulse  oximeter. She also  verbalizes importance of maintaining O2 sat >88. She demonstrates proper pursed lip breathing technique. Will continue to monitor.       Goals/Expected Outcomes  Patient will continue to meet her short and long term goals in the program and after discharge.   Patient will continue to meet her short and long term goals in the program and after discharge.          Oxygen Discharge (Final Oxygen Re-Evaluation): Oxygen Re-Evaluation - 05/16/17 1452      Program Oxygen Prescription   Program Oxygen Prescription  None      Home Oxygen   Home Oxygen Device  None    Sleep Oxygen Prescription  None    Home Exercise Oxygen Prescription  None    Home at Rest Exercise Oxygen Prescription  None      Goals/Expected Outcomes   Short Term Goals  To learn and understand importance of monitoring SPO2 with pulse oximeter and demonstrate accurate use of the pulse oximeter.;To learn and understand importance of maintaining oxygen saturations>88%;To learn and demonstrate proper pursed lip breathing techniques or other breathing techniques.    Long  Term Goals  Verbalizes importance of monitoring SPO2 with pulse oximeter and return demonstration;Maintenance of O2 saturations>88%;Exhibits proper breathing techniques, such as pursed lip breathing or other method taught during program session    Comments  Patient verbalizes importance of monitoring O2 sat and demonstrates proper use of pulse oximeter. She also verbalizes importance of maintaining O2 sat >88. She demonstrates proper pursed lip breathing technique. Will continue to monitor.     Goals/Expected Outcomes  Patient will continue to meet her short and long term goals in the program and after discharge.        Initial Exercise Prescription: Initial Exercise Prescription - 03/04/17 1000      Date of Initial Exercise RX and Referring Provider   Date  03/04/17    Referring Provider  Dr. Chase Caller      Treadmill   MPH  1.3    Grade  0    Minutes  15     METs  1.9      NuStep   Level  2    SPM  81    Minutes  20    METs  1.9      Prescription Details   Frequency (times per week)  2    Duration  Progress to 30 minutes of continuous aerobic without signs/symptoms of physical distress      Intensity   THRR 40-80% of Max Heartrate  [phone]    Ratings of Perceived Exertion  11-13    Perceived Dyspnea  0-4      Progression   Progression  Continue progressive overload as per policy without signs/symptoms or physical distress.      Resistance Training   Training Prescription  Yes    Weight  1    Reps  10-15       Perform Capillary Blood Glucose checks as needed.  Exercise Prescription Changes:  Exercise Prescription Changes    Row Name 03/19/17 1500 04/04/17 0700 04/12/17 1400 05/01/17 1400 05/13/17 1400     Response to Exercise   Blood Pressure (Admit)  120/72  120/70  118/72  116/70  108/68   Blood Pressure (Exercise)  136/72  112/70  122/70  132/74  110/82   Blood Pressure (Exit)  126/72  110/70  116/60  110/64  100/64   Heart Rate (Admit)  95  bpm  110 bpm  97 bpm  85 bpm  86 bpm   Heart Rate (Exercise)  96 bpm  124 bpm  106 bpm  108 bpm  97 bpm   Heart Rate (Exit)  90 bpm  111 bpm  82 bpm  95 bpm  97 bpm   Oxygen Saturation (Admit)  95 %  93 %  94 %  95 %  95 %   Oxygen Saturation (Exercise)  93 %  90 %  93 %  90 %  90 %   Oxygen Saturation (Exit)  96 %  93 %  96 %  95 %  96 %   Rating of Perceived Exertion (Exercise)  11  9  9  9  10    Perceived Dyspnea (Exercise)  10  9  9  9  10    Duration  Progress to 30 minutes of  aerobic without signs/symptoms of physical distress  Progress to 30 minutes of  aerobic without signs/symptoms of physical distress  Progress to 30 minutes of  aerobic without signs/symptoms of physical distress  Progress to 30 minutes of  aerobic without signs/symptoms of physical distress  Progress to 30 minutes of  aerobic without signs/symptoms of physical distress   Intensity  THRR New  [phone]  THRR New 129-139-148  THRR New 121-134-146  THRR New [phone]  THRR New [phone]     Progression   Progression  Continue to progress workloads to maintain intensity without signs/symptoms of physical distress.  Continue to progress workloads to maintain intensity without signs/symptoms of physical distress.  Continue to progress workloads to maintain intensity without signs/symptoms of physical distress.  Continue to progress workloads to maintain intensity without signs/symptoms of physical distress.  Continue to progress workloads to maintain intensity without signs/symptoms of physical distress.     Resistance Training   Training Prescription  Yes  Yes  Yes  Yes  Yes   Weight  1  2  2  3  3    Reps  10-15  10-15  10-15  10-15  10-15     Treadmill   MPH  1.8  2.2  2.4  2.6  2.6   Grade  0  0  0  0  0   Minutes  15  15  15  15  15    METs  2.37  2.6  2.8  2.9  2.9     NuStep   Level  2  -  -  -  -   SPM  106  -  -  -  -   Minutes  20  -  -  -  -   METs  2.2  -  -  -  -     Recumbant Elliptical   Level  -  1  2  2  2    RPM  -  67  61  62  62   Watts  -  92  68  73  95   Minutes  -  20  20  20  20    METs  -  5.4  4.7  4.3  5.4     Home Exercise Plan   Plans to continue exercise at  Home (comment)  Home (comment)  Home (comment)  Home (comment)  Home (comment)   Frequency  Add 2 additional days to program exercise sessions.  Add 2 additional days to program exercise sessions.  Add 2 additional days to program exercise sessions.  Add 2 additional days to program exercise sessions.  Add 2 additional days to program exercise sessions.   Initial Home Exercises Provided  03/19/17  03/19/17  03/19/17  03/19/17  03/19/17      Exercise Comments:  Exercise Comments    Row Name 03/19/17 1506 04/04/17 0752 04/12/17 1449 05/01/17 1434 05/13/17 1439   Exercise Comments  Patient is doing very well while in PR. She has only recently started but has progressed on the treadmill  and has very high SPms on the Nustep and is being considered to be moved to the recumbent elliptical.   Patient is doing well in PR. She has increased her speed on the treadmill and has been changed to the recumbent elliptical for more of a challenge. She states that she is getting more energy from th eprogram and is increasing her endurance.   Patient is doing well in PR. She has increased her levels on both the treadmill and the recumbent elliptical. She states that she feels that she is getting stronger and has increased some endurance since she has been here.   Patient is doing well in PR. She has increased her speed on the treadmil to 2.6 and has maintained her level on the recumbent elliptical.   Patient was doing well in PR and has maintained all of her levels and SPMs on her machine. Due to health reasons patient has not returned to PR recently so no progressions have been made. Patient stated that she felt stronger and has gained more endurance before her hiatus.      Exercise Goals and Review:  Exercise Goals    Row Name 03/04/17 1006             Exercise Goals   Increase Physical Activity  Yes       Intervention  Provide advice, education, support and counseling about physical activity/exercise needs.;Develop an individualized exercise prescription for aerobic and resistive training based on initial evaluation findings, risk stratification, comorbidities and participant's personal goals.       Expected Outcomes  Achievement of increased cardiorespiratory fitness and enhanced flexibility, muscular endurance and strength shown through measurements of functional capacity and personal statement of participant.       Increase Strength and Stamina  Yes       Intervention  Develop an individualized exercise prescription for aerobic and resistive training based on initial evaluation findings, risk stratification, comorbidities and participant's personal goals.;Provide advice, education, support  and counseling about physical activity/exercise needs.       Expected Outcomes  Achievement of increased cardiorespiratory fitness and enhanced flexibility, muscular endurance and strength shown through measurements of functional capacity and personal statement of participant.       Able to understand and use rate of perceived exertion (RPE) scale  Yes       Intervention  Provide education and explanation on how to use RPE scale       Expected Outcomes  Short Term: Able to use RPE daily in rehab to express subjective intensity level;Long Term:  Able to use RPE to guide intensity level when exercising independently       Able to understand and use Dyspnea scale  Yes       Intervention  Provide education and explanation on how to use Dyspnea scale       Expected Outcomes  Short Term: Able to use Dyspnea scale daily in rehab to express subjective sense of shortness of breath during exertion;Long Term: Able to use  Dyspnea scale to guide intensity level when exercising independently       Knowledge and understanding of Target Heart Rate Range (THRR)  Yes       Intervention  Provide education and explanation of THRR including how the numbers were predicted and where they are located for reference       Expected Outcomes  Short Term: Able to state/look up THRR       Able to check pulse independently  Yes       Intervention  Provide education and demonstration on how to check pulse in carotid and radial arteries.;Review the importance of being able to check your own pulse for safety during independent exercise       Expected Outcomes  Short Term: Able to explain why pulse checking is important during independent exercise;Long Term: Able to check pulse independently and accurately       Understanding of Exercise Prescription  Yes       Intervention  Provide education, explanation, and written materials on patient's individual exercise prescription       Expected Outcomes  Short Term: Able to explain program  exercise prescription;Long Term: Able to explain home exercise prescription to exercise independently          Exercise Goals Re-Evaluation : Exercise Goals Re-Evaluation    Row Name 03/19/17 1504 04/12/17 1448 05/13/17 1437         Exercise Goal Re-Evaluation   Exercise Goals Review  Increase Physical Activity;Increase Strength and Stamina;Knowledge and understanding of Target Heart Rate Range (THRR)  Increase Physical Activity;Increase Strength and Stamina;Knowledge and understanding of Target Heart Rate Range (THRR)  Increase Physical Activity;Increase Strength and Stamina;Knowledge and understanding of Target Heart Rate Range (THRR)     Comments  Patient is doing very well while in PR. She has only recently started but has progressed on the treadmill and has very high SPms on the Nustep and is being considered to be moved to the recumbent elliptical.   Patient is doing well in PR. She has increased her levels on both the treadmill and the recumbent elliptical. She states that she feels that she is getting stronger and has increased some endurance since she has been here.   Patient was doing well in PR and has maintained all of her levels and SPMs on her machine. Due to health reasons patient has not returned to PR recently so no progressions have been made. Patient stated that she felt stronger and has gained more endurance before her hiatus.      Expected Outcomes  Pastient wishes to breathe better and to get back to normal activites.   Patient wishes to be able to breathe better and to get back to normal activities.   Patient wishes to be able to breathe better and to get back to normal activities.         Discharge Exercise Prescription (Final Exercise Prescription Changes): Exercise Prescription Changes - 05/13/17 1400      Response to Exercise   Blood Pressure (Admit)  108/68    Blood Pressure (Exercise)  110/82    Blood Pressure (Exit)  100/64    Heart Rate (Admit)  86 bpm    Heart  Rate (Exercise)  97 bpm    Heart Rate (Exit)  97 bpm    Oxygen Saturation (Admit)  95 %    Oxygen Saturation (Exercise)  90 %    Oxygen Saturation (Exit)  96 %    Rating of Perceived Exertion (  Exercise)  10    Perceived Dyspnea (Exercise)  10    Duration  Progress to 30 minutes of  aerobic without signs/symptoms of physical distress    Intensity  THRR New [phone]      Progression   Progression  Continue to progress workloads to maintain intensity without signs/symptoms of physical distress.      Resistance Training   Training Prescription  Yes    Weight  3    Reps  10-15      Treadmill   MPH  2.6    Grade  0    Minutes  15    METs  2.9      Recumbant Elliptical   Level  2    RPM  62    Watts  95    Minutes  20    METs  5.4      Home Exercise Plan   Plans to continue exercise at  Home (comment)    Frequency  Add 2 additional days to program exercise sessions.    Initial Home Exercises Provided  03/19/17       Nutrition:  Target Goals: Understanding of nutrition guidelines, daily intake of sodium <1555m, cholesterol <2068m calories 30% from fat and 7% or less from saturated fats, daily to have 5 or more servings of fruits and vegetables.  Biometrics: Pre Biometrics - 03/04/17 1007      Pre Biometrics   Height  5' 7"  (1.702 m)    Weight  164 lb 9.6 oz (74.7 kg)    Waist Circumference  34.5 inches    Hip Circumference  39 inches    Waist to Hip Ratio  0.88 %    BMI (Calculated)  25.77    Triceps Skinfold  18 mm    % Body Fat  34.6 %    Grip Strength  65.4 kg    Flexibility  0 in    Single Leg Stand  14 seconds        Nutrition Therapy Plan and Nutrition Goals: Nutrition Therapy & Goals - 04/04/17 1304      Personal Nutrition Goals   Nutrition Goal  For heart healthy choices add >50% of whole grains, make half their plate fruits and vegetables. Discuss the difference between starchy vegetables and leafy greens, and how leafy vegetables provide fiber,  helps maintain healthy weight, helps control blood glucose, and lowers cholesterol.  Discuss purchasing fresh or frozen vegetable to reduce sodium and not to add grease, fat or sugar. Consume <18oz of red meat per week. Consume lean cuts of meats and very little of meats high in sodium and nitrates such as pork and lunch meats. Discussed portion control for all food groups      Intervention Plan   Intervention  Nutrition handout(s) given to patient.    Expected Outcomes  Short Term Goal: Understand basic principles of dietary content, such as calories, fat, sodium, cholesterol and nutrients.;Short Term Goal: A plan has been developed with personal nutrition goals set during dietitian appointment.       Nutrition Assessments: Nutrition Assessments - 03/04/17 1018      MEDFICTS Scores   Pre Score  39       Nutrition Goals Re-Evaluation: Nutrition Goals Re-Evaluation    [place] 04/18/17 1514 05/16/17 1451           Goals   Current Weight  163 lb (73.9 kg)  160 lb 12.8 oz (72.9 kg)  Nutrition Goal  For heart healthy choices add >50% of whole grains, make half their plate fruits and vegetables. Discuss the difference between starchy vegetables and leafy greens, and how leafy vegetables provide fiber, helps maintain healthy weight, helps control blood glucose, and lowers cholesterol.  Discuss purchasing fresh or frozen vegetable to reduce sodium and not to add grease, fat or sugar. Consume <18oz of red meat per week. Consume lean cuts of meats and very little of meats high in sodium and nitrates such as pork and lunch meats. Discussed portion control for all food groups  For heart healthy choices add >50% of whole grains, make half their plate fruits and vegetables. Discuss the difference between starchy vegetables and leafy greens, and how leafy vegetables provide fiber, helps maintain healthy weight, helps control blood glucose, and lowers cholesterol.  Discuss purchasing fresh or frozen  vegetable to reduce sodium and not to add grease, fat or sugar. Consume <18oz of red meat per week. Consume lean cuts of meats and very little of meats high in sodium and nitrates such as pork and lunch meats. Discussed portion control for all food groups      Comment  Patient has lost 1 lb since last 30 day review. She says she feels she is meeting her nutritional goals. She is trying to eat more fruits and vegatables and smaller portions.   Patient has lost 3 lb since last 30 day review. She says she feels she is meeting her nutritional goals. She is trying to eat more fruits and vegatables and smaller portions.       Expected Outcome  Patient will meet her nutritional goals at discharge.   Patient will meet her nutritional goals at discharge.         Personal Goal #2 Re-Evaluation   Personal Goal #2  Patient is eating a heart healthy diet.   Patient is eating a heart healthy diet.          Nutrition Goals Discharge (Final Nutrition Goals Re-Evaluation): Nutrition Goals Re-Evaluation - 05/16/17 1451      Goals   Current Weight  160 lb 12.8 oz (72.9 kg)    Nutrition Goal  For heart healthy choices add >50% of whole grains, make half their plate fruits and vegetables. Discuss the difference between starchy vegetables and leafy greens, and how leafy vegetables provide fiber, helps maintain healthy weight, helps control blood glucose, and lowers cholesterol.  Discuss purchasing fresh or frozen vegetable to reduce sodium and not to add grease, fat or sugar. Consume <18oz of red meat per week. Consume lean cuts of meats and very little of meats high in sodium and nitrates such as pork and lunch meats. Discussed portion control for all food groups    Comment  Patient has lost 3 lb since last 30 day review. She says she feels she is meeting her nutritional goals. She is trying to eat more fruits and vegatables and smaller portions.     Expected Outcome  Patient will meet her nutritional goals at discharge.        Personal Goal #2 Re-Evaluation   Personal Goal #2  Patient is eating a heart healthy diet.        Psychosocial: Target Goals: Acknowledge presence or absence of significant depression and/or stress, maximize coping skills, provide positive support system. Participant is able to verbalize types and ability to use techniques and skills needed for reducing stress and depression.  Initial Review & Psychosocial Screening: Initial Psych Review &  Screening - 03/04/17 1022      Initial Review   Current issues with  None Identified      Family Dynamics   Good Support System?  Yes      Barriers   Psychosocial barriers to participate in program  There are no identifiable barriers or psychosocial needs. QOL score 20.47      Screening Interventions   Interventions  Encouraged to exercise       Quality of Life Scores: Quality of Life - 03/04/17 1008      Quality of Life Scores   Health/Function Pre  15.5 %    Socioeconomic Pre  24.36 %    Psych/Spiritual Pre  23.36 %    Family Pre  28.5 %    GLOBAL Pre  20.47 %      Scores of 19 and below usually indicate a poorer quality of life in these areas.  A difference of  2-3 points is a clinically meaningful difference.  A difference of 2-3 points in the total score of the Quality of Life Index has been associated with significant improvement in overall quality of life, self-image, physical symptoms, and general health in studies assessing change in quality of life.   PHQ-9: Recent Review Flowsheet Data    Depression screen [facility] 2/9 03/04/2017   Decreased Interest 0   Down, Depressed, Hopeless 0   PHQ - 2 Score 0   Altered sleeping 0   Tired, decreased energy 1   Change in appetite 0   Feeling bad or failure about yourself  0   Trouble concentrating 0   Moving slowly or fidgety/restless 0   Suicidal thoughts 0   PHQ-9 Score 1   Difficult doing work/chores Somewhat difficult     Interpretation of Total Score  Total Score  Depression Severity:  1-4 = Minimal depression, 5-9 = Mild depression, 10-14 = Moderate depression, 15-19 = Moderately severe depression, 20-27 = Severe depression   Psychosocial Evaluation and Intervention: Psychosocial Evaluation - 03/04/17 1023      Psychosocial Evaluation & Interventions   Interventions  Encouraged to exercise with the program and follow exercise prescription    Continue Psychosocial Services   No Follow up required       Psychosocial Re-Evaluation: Psychosocial Re-Evaluation    [place] Name 04/18/17 1519 05/16/17 1454           Psychosocial Re-Evaluation   Current issues with  None Identified  None Identified      Comments  Patient's initial QOL score was 20.47 and her PHQ-9 score was 1. No psychosocial barriers identified.   Patient's initial QOL score was 20.47 and her PHQ-9 score was 1. No psychosocial barriers identified.       Expected Outcomes  Patient will have no psychosocial barriers identified at discharge and her QOL and PHQ-9 scores will improve.  Patient will have no psychosocial barriers identified at discharge and her QOL and PHQ-9 scores will improve.      Interventions  Relaxation education;Encouraged to attend Pulmonary Rehabilitation for the exercise;Stress management education  Relaxation education;Encouraged to attend Pulmonary Rehabilitation for the exercise;Stress management education      Continue Psychosocial Services   No Follow up required  No Follow up required         Psychosocial Discharge (Final Psychosocial Re-Evaluation): Psychosocial Re-Evaluation - 05/16/17 1454      Psychosocial Re-Evaluation   Current issues with  None Identified    Comments  Patient's initial QOL score was  20.47 and her PHQ-9 score was 1. No psychosocial barriers identified.     Expected Outcomes  Patient will have no psychosocial barriers identified at discharge and her QOL and PHQ-9 scores will improve.    Interventions  Relaxation education;Encouraged to  attend Pulmonary Rehabilitation for the exercise;Stress management education    Continue Psychosocial Services   No Follow up required        Education: Education Goals: Education classes will be provided on a weekly basis, covering required topics. Participant will state understanding/return demonstration of topics presented.  Learning Barriers/Preferences: Learning Barriers/Preferences - 03/04/17 3154      Learning Barriers/Preferences   Learning Barriers  None    Learning Preferences  Written Material;Pictoral       Education Topics: How Lungs Work and Diseases: - Discuss the anatomy of the lungs and diseases that can affect the lungs, such as COPD.   Exercise: -Discuss the importance of exercise, FITT principles of exercise, normal and abnormal responses to exercise, and how to exercise safely.   PULMONARY REHAB OTHER RESPIRATORY from 05/02/2017 in [place]  Date  05/02/17  Educator  [place]  Instruction Review Code  2- Demonstrated Understanding      Environmental Irritants: -Discuss types of environmental irritants and how to limit exposure to environmental irritants.   Meds/Inhalers and oxygen: - Discuss respiratory medications, definition of an inhaler and oxygen, and the proper way to use an inhaler and oxygen.   Energy Saving Techniques: - Discuss methods to conserve energy and decrease shortness of breath when performing activities of daily living.    Bronchial Hygiene / Breathing Techniques: - Discuss breathing mechanics, pursed-lip breathing technique,  proper posture, effective ways to clear airways, and other functional breathing techniques   Cleaning Equipment: - Provides group verbal and written instruction about the health risks of elevated stress, cause of high stress, and healthy ways to reduce stress.   PULMONARY REHAB OTHER RESPIRATORY from 05/02/2017 in [place]  Date  03/14/17  Educator  DWynetta Emer[place]   Instruction Review Code  2- Demonstrated Understanding      Nutrition I: Fats: - Discuss the types of cholesterol, what cholesterol does to the body, and how cholesterol levels can be controlled.   PULMONARY REHAB OTHER RESPIRATORY from 05/02/2017 in [place]  Date  03/21/17  Educator  [place]  Instruction Review Code  2- Demonstrated Understanding      Nutrition II: Labels: -Discuss the different components of food labels and how to read food labels.   PULMONARY REHAB OTHER RESPIRATORY from 05/02/2017 in [place]  Date  03/28/17  Educator  [place]  Instruction Review Code  2- Demonstrated Understanding      Respiratory Infections: - Discuss the signs and symptoms of respiratory infections, ways to prevent respiratory infections, and the importance of seeking medical treatment when having a respiratory infection.   PULMONARY REHAB OTHER RESPIRATORY from 05/02/2017 in [place]  Date  04/04/17  Educator  [place]  Instruction Review Code  2- Demonstrated Understanding      Stress I: Signs and Symptoms: - Discuss the causes of stress, how stress may lead to anxiety and depression, and ways to limit stress.   PULMONARY REHAB OTHER RESPIRATORY from 05/02/2017 in [place]  Date  04/11/17  Educator  [place]  Instruction Review Code  2- Demonstrated Understanding      Stress II: Relaxation: -Discuss relaxation techniques to limit stress.  PULMONARY REHAB OTHER RESPIRATORY from 05/02/2017 in [place]  Date  04/18/17  Educator  [place]  Instruction Review Code  2- Demonstrated Understanding      Oxygen for Home/Travel: - Discuss how to prepare for travel when on oxygen and proper ways to transport and store oxygen to ensure safety.   PULMONARY REHAB OTHER RESPIRATORY from 05/02/2017 in [place]  Date  04/25/17  Educator  [place]  Instruction Review Code   2- Demonstrated Understanding      Knowledge Questionnaire Score: Knowledge Questionnaire Score - 03/04/17 0954      Knowledge Questionnaire Score   Pre Score  14/18       Core Components/Risk Factors/Patient Goals at Admission: Personal Goals and Risk Factors at Admission - 03/04/17 1018      Core Components/Risk Factors/Patient Goals on Admission    Weight Management  Weight Maintenance    Develop more efficient breathing techniques such as purse lipped breathing and diaphragmatic breathing; and practicing self-pacing with activity  Yes    Intervention  Provide education, demonstration and support about specific breathing techniuqes utilized for more efficient breathing. Include techniques such as pursed lipped breathing, diaphragmatic breathing and self-pacing activity.    Expected Outcomes  Short Term: Participant will be able to demonstrate and use breathing techniques as needed throughout daily activities.    Personal Goal Other  Yes    Personal Goal  Breath better, Be able to return to maintaining her home, taking care of husband and dog.     Intervention  Attend PR program 2 x week and supplement at home exercise 3 x week.     Expected Outcomes  Reach goals       Core Components/Risk Factors/Patient Goals Review:  Goals and Risk Factor Review    Row Name 04/18/17 1515 05/16/17 1452           Core Components/Risk Factors/Patient Goals Review   Personal Goals Review  Improve shortness of breath with ADL's;Weight Management/Obesity;Develop more efficient breathing techniques such as purse lipped breathing and diaphragmatic breathing and practicing self-pacing with activity.;Other Breathe better; get back to normal activities.   Improve shortness of breath with ADL's;Weight Management/Obesity;Develop more efficient breathing techniques such as purse lipped breathing and diaphragmatic breathing and practicing self-pacing with activity.;Other Breathe better; get back to normal  activities.       Review  Patient has completed 9 sessions with progression. She says she has learned how to pace herself with activities and how to do pursed lip breathing when she gets SOB with activities. She says she notices less SOB using stairs now. She says when she is doing house chores and she becomes SOB, she stops and sits down and does pursed lip breathing until she recovers. Overall, she says she program is helping her. Will cotinue to monitor for progress.   Patient has completed 12 sessions. She stopped attending 05/02/17 temporarily due having chest pain. She was evaluated in the ED and was referred to a cardiologist for possible CHF. She was advised not to return to PR until she is evaluated by cardiologist. Will continue to monitor for progress.       Expected Outcomes  Patient will continue to attend sessions consistently and complete the program meeting her personal goals.   Patient will be able to return to PR and complete the program meeting her personal goals.          Core Components/Risk Factors/Patient Goals at Discharge (Final Review):  Goals and Risk Factor Review - 05/16/17 1452      Core Components/Risk Factors/Patient Goals Review   Personal Goals Review  Improve shortness of breath with ADL's;Weight Management/Obesity;Develop more efficient breathing techniques such as purse lipped breathing and diaphragmatic breathing and practicing self-pacing with activity.;Other Breathe better; get back to normal activities.     Review  Patient has completed 12 sessions. She stopped attending 05/02/17 temporarily due having chest pain. She was evaluated in the ED and was referred to a cardiologist for possible CHF. She was advised not to return to PR until she is evaluated by cardiologist. Will continue to monitor for progress.     Expected Outcomes  Patient will be able to return to PR and complete the program meeting her personal goals.        ITP Comments: ITP Comments    Row  Name 03/20/17 0753           ITP Comments  Patient new to program comleting 3 sessions. Will continue to monitor for progress.           Comments: ITP 30 Day REVIEW Pt is making expected progress toward pulmonary rehab goals after completing 12 sessions. Recommend continued exercise, life style modification, education, and utilization of breathing techniques to increase stamina and strength and decrease shortness of breath with exertion.

## 2017-05-21 ENCOUNTER — Encounter (HOSPITAL_COMMUNITY): Payer: BLUE CROSS/BLUE SHIELD

## 2017-05-23 ENCOUNTER — Encounter (INDEPENDENT_AMBULATORY_CARE_PROVIDER_SITE_OTHER): Payer: Self-pay | Admitting: *Deleted

## 2017-05-23 ENCOUNTER — Encounter (HOSPITAL_COMMUNITY): Payer: BLUE CROSS/BLUE SHIELD

## 2017-05-27 ENCOUNTER — Other Ambulatory Visit (INDEPENDENT_AMBULATORY_CARE_PROVIDER_SITE_OTHER): Payer: Self-pay | Admitting: Internal Medicine

## 2017-05-27 DIAGNOSIS — K760 Fatty (change of) liver, not elsewhere classified: Secondary | ICD-10-CM | POA: Insufficient documentation

## 2017-05-27 DIAGNOSIS — Z1211 Encounter for screening for malignant neoplasm of colon: Secondary | ICD-10-CM

## 2017-05-27 NOTE — Progress Notes (Signed)
Pulmonary Individual Treatment Plan  Patient Details  Name: Jordan Weaver MRN: [id number] Date of Birth: 26-Mar-1956 Referring Provider:     PULMONARY REHAB OTHER RESP ORIENTATION from 03/04/2017 in [place]  Referring Provider  Dr. Chase Caller      Initial Encounter Date:    [place] from 03/04/2017 in [place]  Date  03/04/17  Referring Provider  Dr. Chase Caller      Visit Diagnosis: Dyspnea on exertion  Patient's Home Medications on Admission:   Current Outpatient Medications:  .  aspirin 81 MG tablet, Take 81 mg by mouth daily., Disp: , Rfl:  .  benazepril (LOTENSIN) 10 MG tablet, 10 mg daily. , Disp: , Rfl: 3 .  betamethasone dipropionate (DIPROLENE) 0.05 % cream, Apply topically 2 (two) times daily., Disp: 30 g, Rfl: 4 .  DULoxetine (CYMBALTA) 30 MG capsule, Take 30 mg by mouth 2 (two) times daily. , Disp: , Rfl:  .  furosemide (LASIX) 40 MG tablet, Take 1 tablet (40 mg total) daily by mouth., Disp: 60 tablet, Rfl: 3 .  gabapentin (NEURONTIN) 100 MG capsule, Take 100 mg by mouth 2 (two) times daily. , Disp: , Rfl:  .  omeprazole (PRILOSEC) 20 MG capsule, Take 20 mg by mouth daily., Disp: , Rfl:  .  Potassium Chloride ER 20 MEQ TBCR, Take 20 mEq daily by mouth., Disp: 60 tablet, Rfl: 3 .  spironolactone (ALDACTONE) 25 MG tablet, Take 25 mg by mouth daily. , Disp: , Rfl:   Past Medical History: Past Medical History:  Diagnosis Date  . Celiac disease   . GERD (gastroesophageal reflux disease)   . Hypertension     Tobacco Use: Social History   Tobacco Use  Smoking Status Never Smoker  Smokeless Tobacco Never Used    Labs: Recent Review Flowsheet Data    There is no flowsheet data to display.      Capillary Blood Glucose: No results found for: GLUCAP   Pulmonary Assessment Scores: Pulmonary Assessment Scores    Row Name 03/04/17 0954         ADL [facility]   ADL Phase  Entry     SOB  Score total  70     Rest  0     Walk  8     Stairs  4     Bath  4     Dress  3     Shop  2       CAT Score   CAT Score  17       mMRC Score   mMRC Score  3        Pulmonary Function Assessment:   Exercise Target Goals:    Exercise Program Goal: Individual exercise prescription set using results from initial 6 min walk test and THRR while considering  patient's activity barriers and safety.   Exercise Prescription Goal: Initial exercise prescription builds to 30-45 minutes a day of aerobic activity, 2-3 days per week.  Home exercise guidelines will be given to patient during program as part of exercise prescription that the participant will acknowledge.  Activity Barriers & Risk Stratification: Activity Barriers & Cardiac Risk Stratification - 03/04/17 1005      Activity Barriers & Cardiac Risk Stratification   Activity Barriers  Back Problems;Joint Problems;Shortness of Breath;Chest Pain/Angina    Cardiac Risk Stratification  Low       6 Minute Walk: 6 Minute Walk    Row Name  03/04/17 1004         6 Minute Walk   Phase  Initial     Distance  1250 feet     Distance % Change  0 %     Distance Feet Change  0 ft     Walk Time  6 minutes     # of Rest Breaks  0     MPH  2.36     METS  2.81     RPE  10     Perceived Dyspnea   13     VO2 Peak  11.47     Symptoms  Yes (comment)     Comments  Chest discomfort at exertion     Resting HR  90 bpm     Resting BP  108/82     Resting Oxygen Saturation   95 %     Exercise Oxygen Saturation  during 6 min walk  90 %     Max Ex. HR  96 bpm     Max Ex. BP  126/80     2 Minute Post BP  110/84        Oxygen Initial Assessment: Oxygen Initial Assessment - 03/04/17 1016      Home Oxygen   Home Oxygen Device  None    Sleep Oxygen Prescription  None    Home Exercise Oxygen Prescription  None    Home at Rest Exercise Oxygen Prescription  None      Initial 6 min Walk   Oxygen Used  None      Program Oxygen  Prescription   Program Oxygen Prescription  None       Oxygen Re-Evaluation: Oxygen Re-Evaluation    Row Name 04/18/17 1509 05/16/17 1452           Program Oxygen Prescription   Program Oxygen Prescription  None  None        Home Oxygen   Home Oxygen Device  None  None      Sleep Oxygen Prescription  None  None      Home Exercise Oxygen Prescription  None  None      Home at Rest Exercise Oxygen Prescription  None  None        Goals/Expected Outcomes   Short Term Goals  To learn and understand importance of monitoring SPO2 with pulse oximeter and demonstrate accurate use of the pulse oximeter.;To learn and understand importance of maintaining oxygen saturations>88%;To learn and demonstrate proper pursed lip breathing techniques or other breathing techniques.  To learn and understand importance of monitoring SPO2 with pulse oximeter and demonstrate accurate use of the pulse oximeter.;To learn and understand importance of maintaining oxygen saturations>88%;To learn and demonstrate proper pursed lip breathing techniques or other breathing techniques.      Long  Term Goals  Verbalizes importance of monitoring SPO2 with pulse oximeter and return demonstration;Maintenance of O2 saturations>88%;Exhibits proper breathing techniques, such as pursed lip breathing or other method taught during program session  Verbalizes importance of monitoring SPO2 with pulse oximeter and return demonstration;Maintenance of O2 saturations>88%;Exhibits proper breathing techniques, such as pursed lip breathing or other method taught during program session      Comments  Patient verbalizes importance of monitoring O2 sat and demonstrates proper use of pulse oximeter. She also verbalizes importance of maintaining O2 sat >88. She demonstrates proper pursed lip breathing technique. Will continue to monitor.   Patient verbalizes importance of monitoring O2 sat and demonstrates proper use of pulse  oximeter. She also  verbalizes importance of maintaining O2 sat >88. She demonstrates proper pursed lip breathing technique. Will continue to monitor.       Goals/Expected Outcomes  Patient will continue to meet her short and long term goals in the program and after discharge.   Patient will continue to meet her short and long term goals in the program and after discharge.          Oxygen Discharge (Final Oxygen Re-Evaluation): Oxygen Re-Evaluation - 05/16/17 1452      Program Oxygen Prescription   Program Oxygen Prescription  None      Home Oxygen   Home Oxygen Device  None    Sleep Oxygen Prescription  None    Home Exercise Oxygen Prescription  None    Home at Rest Exercise Oxygen Prescription  None      Goals/Expected Outcomes   Short Term Goals  To learn and understand importance of monitoring SPO2 with pulse oximeter and demonstrate accurate use of the pulse oximeter.;To learn and understand importance of maintaining oxygen saturations>88%;To learn and demonstrate proper pursed lip breathing techniques or other breathing techniques.    Long  Term Goals  Verbalizes importance of monitoring SPO2 with pulse oximeter and return demonstration;Maintenance of O2 saturations>88%;Exhibits proper breathing techniques, such as pursed lip breathing or other method taught during program session    Comments  Patient verbalizes importance of monitoring O2 sat and demonstrates proper use of pulse oximeter. She also verbalizes importance of maintaining O2 sat >88. She demonstrates proper pursed lip breathing technique. Will continue to monitor.     Goals/Expected Outcomes  Patient will continue to meet her short and long term goals in the program and after discharge.        Initial Exercise Prescription: Initial Exercise Prescription - 03/04/17 1000      Date of Initial Exercise RX and Referring Provider   Date  03/04/17    Referring Provider  Dr. Chase Caller      Treadmill   MPH  1.3    Grade  0    Minutes  15     METs  1.9      NuStep   Level  2    SPM  81    Minutes  20    METs  1.9      Prescription Details   Frequency (times per week)  2    Duration  Progress to 30 minutes of continuous aerobic without signs/symptoms of physical distress      Intensity   THRR 40-80% of Max Heartrate  [phone]    Ratings of Perceived Exertion  11-13    Perceived Dyspnea  0-4      Progression   Progression  Continue progressive overload as per policy without signs/symptoms or physical distress.      Resistance Training   Training Prescription  Yes    Weight  1    Reps  10-15       Perform Capillary Blood Glucose checks as needed.  Exercise Prescription Changes:  Exercise Prescription Changes    Row Name 03/19/17 1500 04/04/17 0700 04/12/17 1400 05/01/17 1400 05/13/17 1400     Response to Exercise   Blood Pressure (Admit)  120/72  120/70  118/72  116/70  108/68   Blood Pressure (Exercise)  136/72  112/70  122/70  132/74  110/82   Blood Pressure (Exit)  126/72  110/70  116/60  110/64  100/64   Heart Rate (Admit)  95  bpm  110 bpm  97 bpm  85 bpm  86 bpm   Heart Rate (Exercise)  96 bpm  124 bpm  106 bpm  108 bpm  97 bpm   Heart Rate (Exit)  90 bpm  111 bpm  82 bpm  95 bpm  97 bpm   Oxygen Saturation (Admit)  95 %  93 %  94 %  95 %  95 %   Oxygen Saturation (Exercise)  93 %  90 %  93 %  90 %  90 %   Oxygen Saturation (Exit)  96 %  93 %  96 %  95 %  96 %   Rating of Perceived Exertion (Exercise)  11  9  9  9  10    Perceived Dyspnea (Exercise)  10  9  9  9  10    Duration  Progress to 30 minutes of  aerobic without signs/symptoms of physical distress  Progress to 30 minutes of  aerobic without signs/symptoms of physical distress  Progress to 30 minutes of  aerobic without signs/symptoms of physical distress  Progress to 30 minutes of  aerobic without signs/symptoms of physical distress  Progress to 30 minutes of  aerobic without signs/symptoms of physical distress   Intensity  THRR New  [phone]  THRR New 129-139-148  THRR New 121-134-146  THRR New [phone]  THRR New [phone]     Progression   Progression  Continue to progress workloads to maintain intensity without signs/symptoms of physical distress.  Continue to progress workloads to maintain intensity without signs/symptoms of physical distress.  Continue to progress workloads to maintain intensity without signs/symptoms of physical distress.  Continue to progress workloads to maintain intensity without signs/symptoms of physical distress.  Continue to progress workloads to maintain intensity without signs/symptoms of physical distress.     Resistance Training   Training Prescription  Yes  Yes  Yes  Yes  Yes   Weight  1  2  2  3  3    Reps  10-15  10-15  10-15  10-15  10-15     Treadmill   MPH  1.8  2.2  2.4  2.6  2.6   Grade  0  0  0  0  0   Minutes  15  15  15  15  15    METs  2.37  2.6  2.8  2.9  2.9     NuStep   Level  2  -  -  -  -   SPM  106  -  -  -  -   Minutes  20  -  -  -  -   METs  2.2  -  -  -  -     Recumbant Elliptical   Level  -  1  2  2  2    RPM  -  67  61  62  62   Watts  -  92  68  73  95   Minutes  -  20  20  20  20    METs  -  5.4  4.7  4.3  5.4     Home Exercise Plan   Plans to continue exercise at  Home (comment)  Home (comment)  Home (comment)  Home (comment)  Home (comment)   Frequency  Add 2 additional days to program exercise sessions.  Add 2 additional days to program exercise sessions.  Add 2 additional days to program exercise sessions.  Add 2 additional days to program exercise sessions.  Add 2 additional days to program exercise sessions.   Initial Home Exercises Provided  03/19/17  03/19/17  03/19/17  03/19/17  03/19/17      Exercise Comments:  Exercise Comments    Row Name 03/19/17 1506 04/04/17 0752 04/12/17 1449 05/01/17 1434 05/13/17 1439   Exercise Comments  Patient is doing very well while in PR. She has only recently started but has progressed on the treadmill  and has very high SPms on the Nustep and is being considered to be moved to the recumbent elliptical.   Patient is doing well in PR. She has increased her speed on the treadmill and has been changed to the recumbent elliptical for more of a challenge. She states that she is getting more energy from th eprogram and is increasing her endurance.   Patient is doing well in PR. She has increased her levels on both the treadmill and the recumbent elliptical. She states that she feels that she is getting stronger and has increased some endurance since she has been here.   Patient is doing well in PR. She has increased her speed on the treadmil to 2.6 and has maintained her level on the recumbent elliptical.   Patient was doing well in PR and has maintained all of her levels and SPMs on her machine. Due to health reasons patient has not returned to PR recently so no progressions have been made. Patient stated that she felt stronger and has gained more endurance before her hiatus.      Exercise Goals and Review:  Exercise Goals    Row Name 03/04/17 1006             Exercise Goals   Increase Physical Activity  Yes       Intervention  Provide advice, education, support and counseling about physical activity/exercise needs.;Develop an individualized exercise prescription for aerobic and resistive training based on initial evaluation findings, risk stratification, comorbidities and participant's personal goals.       Expected Outcomes  Achievement of increased cardiorespiratory fitness and enhanced flexibility, muscular endurance and strength shown through measurements of functional capacity and personal statement of participant.       Increase Strength and Stamina  Yes       Intervention  Develop an individualized exercise prescription for aerobic and resistive training based on initial evaluation findings, risk stratification, comorbidities and participant's personal goals.;Provide advice, education, support  and counseling about physical activity/exercise needs.       Expected Outcomes  Achievement of increased cardiorespiratory fitness and enhanced flexibility, muscular endurance and strength shown through measurements of functional capacity and personal statement of participant.       Able to understand and use rate of perceived exertion (RPE) scale  Yes       Intervention  Provide education and explanation on how to use RPE scale       Expected Outcomes  Short Term: Able to use RPE daily in rehab to express subjective intensity level;Long Term:  Able to use RPE to guide intensity level when exercising independently       Able to understand and use Dyspnea scale  Yes       Intervention  Provide education and explanation on how to use Dyspnea scale       Expected Outcomes  Short Term: Able to use Dyspnea scale daily in rehab to express subjective sense of shortness of breath during exertion;Long Term: Able to use  Dyspnea scale to guide intensity level when exercising independently       Knowledge and understanding of Target Heart Rate Range (THRR)  Yes       Intervention  Provide education and explanation of THRR including how the numbers were predicted and where they are located for reference       Expected Outcomes  Short Term: Able to state/look up THRR       Able to check pulse independently  Yes       Intervention  Provide education and demonstration on how to check pulse in carotid and radial arteries.;Review the importance of being able to check your own pulse for safety during independent exercise       Expected Outcomes  Short Term: Able to explain why pulse checking is important during independent exercise;Long Term: Able to check pulse independently and accurately       Understanding of Exercise Prescription  Yes       Intervention  Provide education, explanation, and written materials on patient's individual exercise prescription       Expected Outcomes  Short Term: Able to explain program  exercise prescription;Long Term: Able to explain home exercise prescription to exercise independently          Exercise Goals Re-Evaluation : Exercise Goals Re-Evaluation    Row Name 03/19/17 1504 04/12/17 1448 05/13/17 1437         Exercise Goal Re-Evaluation   Exercise Goals Review  Increase Physical Activity;Increase Strength and Stamina;Knowledge and understanding of Target Heart Rate Range (THRR)  Increase Physical Activity;Increase Strength and Stamina;Knowledge and understanding of Target Heart Rate Range (THRR)  Increase Physical Activity;Increase Strength and Stamina;Knowledge and understanding of Target Heart Rate Range (THRR)     Comments  Patient is doing very well while in PR. She has only recently started but has progressed on the treadmill and has very high SPms on the Nustep and is being considered to be moved to the recumbent elliptical.   Patient is doing well in PR. She has increased her levels on both the treadmill and the recumbent elliptical. She states that she feels that she is getting stronger and has increased some endurance since she has been here.   Patient was doing well in PR and has maintained all of her levels and SPMs on her machine. Due to health reasons patient has not returned to PR recently so no progressions have been made. Patient stated that she felt stronger and has gained more endurance before her hiatus.      Expected Outcomes  Pastient wishes to breathe better and to get back to normal activites.   Patient wishes to be able to breathe better and to get back to normal activities.   Patient wishes to be able to breathe better and to get back to normal activities.         Discharge Exercise Prescription (Final Exercise Prescription Changes): Exercise Prescription Changes - 05/13/17 1400      Response to Exercise   Blood Pressure (Admit)  108/68    Blood Pressure (Exercise)  110/82    Blood Pressure (Exit)  100/64    Heart Rate (Admit)  86 bpm    Heart  Rate (Exercise)  97 bpm    Heart Rate (Exit)  97 bpm    Oxygen Saturation (Admit)  95 %    Oxygen Saturation (Exercise)  90 %    Oxygen Saturation (Exit)  96 %    Rating of Perceived Exertion (  Exercise)  10    Perceived Dyspnea (Exercise)  10    Duration  Progress to 30 minutes of  aerobic without signs/symptoms of physical distress    Intensity  THRR New [phone]      Progression   Progression  Continue to progress workloads to maintain intensity without signs/symptoms of physical distress.      Resistance Training   Training Prescription  Yes    Weight  3    Reps  10-15      Treadmill   MPH  2.6    Grade  0    Minutes  15    METs  2.9      Recumbant Elliptical   Level  2    RPM  62    Watts  95    Minutes  20    METs  5.4      Home Exercise Plan   Plans to continue exercise at  Home (comment)    Frequency  Add 2 additional days to program exercise sessions.    Initial Home Exercises Provided  03/19/17       Nutrition:  Target Goals: Understanding of nutrition guidelines, daily intake of sodium <1566m, cholesterol <2084m calories 30% from fat and 7% or less from saturated fats, daily to have 5 or more servings of fruits and vegetables.  Biometrics: Pre Biometrics - 03/04/17 1007      Pre Biometrics   Height  5' 7"  (1.702 m)    Weight  164 lb 9.6 oz (74.7 kg)    Waist Circumference  34.5 inches    Hip Circumference  39 inches    Waist to Hip Ratio  0.88 %    BMI (Calculated)  25.77    Triceps Skinfold  18 mm    % Body Fat  34.6 %    Grip Strength  65.4 kg    Flexibility  0 in    Single Leg Stand  14 seconds        Nutrition Therapy Plan and Nutrition Goals: Nutrition Therapy & Goals - 04/04/17 1304      Personal Nutrition Goals   Nutrition Goal  For heart healthy choices add >50% of whole grains, make half their plate fruits and vegetables. Discuss the difference between starchy vegetables and leafy greens, and how leafy vegetables provide fiber,  helps maintain healthy weight, helps control blood glucose, and lowers cholesterol.  Discuss purchasing fresh or frozen vegetable to reduce sodium and not to add grease, fat or sugar. Consume <18oz of red meat per week. Consume lean cuts of meats and very little of meats high in sodium and nitrates such as pork and lunch meats. Discussed portion control for all food groups      Intervention Plan   Intervention  Nutrition handout(s) given to patient.    Expected Outcomes  Short Term Goal: Understand basic principles of dietary content, such as calories, fat, sodium, cholesterol and nutrients.;Short Term Goal: A plan has been developed with personal nutrition goals set during dietitian appointment.       Nutrition Assessments: Nutrition Assessments - 03/04/17 1018      MEDFICTS Scores   Pre Score  39       Nutrition Goals Re-Evaluation: Nutrition Goals Re-Evaluation    [place] 04/18/17 1514 05/16/17 1451           Goals   Current Weight  163 lb (73.9 kg)  160 lb 12.8 oz (72.9 kg)  Nutrition Goal  For heart healthy choices add >50% of whole grains, make half their plate fruits and vegetables. Discuss the difference between starchy vegetables and leafy greens, and how leafy vegetables provide fiber, helps maintain healthy weight, helps control blood glucose, and lowers cholesterol.  Discuss purchasing fresh or frozen vegetable to reduce sodium and not to add grease, fat or sugar. Consume <18oz of red meat per week. Consume lean cuts of meats and very little of meats high in sodium and nitrates such as pork and lunch meats. Discussed portion control for all food groups  For heart healthy choices add >50% of whole grains, make half their plate fruits and vegetables. Discuss the difference between starchy vegetables and leafy greens, and how leafy vegetables provide fiber, helps maintain healthy weight, helps control blood glucose, and lowers cholesterol.  Discuss purchasing fresh or frozen  vegetable to reduce sodium and not to add grease, fat or sugar. Consume <18oz of red meat per week. Consume lean cuts of meats and very little of meats high in sodium and nitrates such as pork and lunch meats. Discussed portion control for all food groups      Comment  Patient has lost 1 lb since last 30 day review. She says she feels she is meeting her nutritional goals. She is trying to eat more fruits and vegatables and smaller portions.   Patient has lost 3 lb since last 30 day review. She says she feels she is meeting her nutritional goals. She is trying to eat more fruits and vegatables and smaller portions.       Expected Outcome  Patient will meet her nutritional goals at discharge.   Patient will meet her nutritional goals at discharge.         Personal Goal #2 Re-Evaluation   Personal Goal #2  Patient is eating a heart healthy diet.   Patient is eating a heart healthy diet.          Nutrition Goals Discharge (Final Nutrition Goals Re-Evaluation): Nutrition Goals Re-Evaluation - 05/16/17 1451      Goals   Current Weight  160 lb 12.8 oz (72.9 kg)    Nutrition Goal  For heart healthy choices add >50% of whole grains, make half their plate fruits and vegetables. Discuss the difference between starchy vegetables and leafy greens, and how leafy vegetables provide fiber, helps maintain healthy weight, helps control blood glucose, and lowers cholesterol.  Discuss purchasing fresh or frozen vegetable to reduce sodium and not to add grease, fat or sugar. Consume <18oz of red meat per week. Consume lean cuts of meats and very little of meats high in sodium and nitrates such as pork and lunch meats. Discussed portion control for all food groups    Comment  Patient has lost 3 lb since last 30 day review. She says she feels she is meeting her nutritional goals. She is trying to eat more fruits and vegatables and smaller portions.     Expected Outcome  Patient will meet her nutritional goals at discharge.        Personal Goal #2 Re-Evaluation   Personal Goal #2  Patient is eating a heart healthy diet.        Psychosocial: Target Goals: Acknowledge presence or absence of significant depression and/or stress, maximize coping skills, provide positive support system. Participant is able to verbalize types and ability to use techniques and skills needed for reducing stress and depression.  Initial Review & Psychosocial Screening: Initial Psych Review &  Screening - 03/04/17 1022      Initial Review   Current issues with  None Identified      Family Dynamics   Good Support System?  Yes      Barriers   Psychosocial barriers to participate in program  There are no identifiable barriers or psychosocial needs. QOL score 20.47      Screening Interventions   Interventions  Encouraged to exercise       Quality of Life Scores: Quality of Life - 03/04/17 1008      Quality of Life Scores   Health/Function Pre  15.5 %    Socioeconomic Pre  24.36 %    Psych/Spiritual Pre  23.36 %    Family Pre  28.5 %    GLOBAL Pre  20.47 %      Scores of 19 and below usually indicate a poorer quality of life in these areas.  A difference of  2-3 points is a clinically meaningful difference.  A difference of 2-3 points in the total score of the Quality of Life Index has been associated with significant improvement in overall quality of life, self-image, physical symptoms, and general health in studies assessing change in quality of life.   PHQ-9: Recent Review Flowsheet Data    Depression screen [facility] 2/9 03/04/2017   Decreased Interest 0   Down, Depressed, Hopeless 0   PHQ - 2 Score 0   Altered sleeping 0   Tired, decreased energy 1   Change in appetite 0   Feeling bad or failure about yourself  0   Trouble concentrating 0   Moving slowly or fidgety/restless 0   Suicidal thoughts 0   PHQ-9 Score 1   Difficult doing work/chores Somewhat difficult     Interpretation of Total Score  Total Score  Depression Severity:  1-4 = Minimal depression, 5-9 = Mild depression, 10-14 = Moderate depression, 15-19 = Moderately severe depression, 20-27 = Severe depression   Psychosocial Evaluation and Intervention: Psychosocial Evaluation - 03/04/17 1023      Psychosocial Evaluation & Interventions   Interventions  Encouraged to exercise with the program and follow exercise prescription    Continue Psychosocial Services   No Follow up required       Psychosocial Re-Evaluation: Psychosocial Re-Evaluation    [place] Name 04/18/17 1519 05/16/17 1454           Psychosocial Re-Evaluation   Current issues with  None Identified  None Identified      Comments  Patient's initial QOL score was 20.47 and her PHQ-9 score was 1. No psychosocial barriers identified.   Patient's initial QOL score was 20.47 and her PHQ-9 score was 1. No psychosocial barriers identified.       Expected Outcomes  Patient will have no psychosocial barriers identified at discharge and her QOL and PHQ-9 scores will improve.  Patient will have no psychosocial barriers identified at discharge and her QOL and PHQ-9 scores will improve.      Interventions  Relaxation education;Encouraged to attend Pulmonary Rehabilitation for the exercise;Stress management education  Relaxation education;Encouraged to attend Pulmonary Rehabilitation for the exercise;Stress management education      Continue Psychosocial Services   No Follow up required  No Follow up required         Psychosocial Discharge (Final Psychosocial Re-Evaluation): Psychosocial Re-Evaluation - 05/16/17 1454      Psychosocial Re-Evaluation   Current issues with  None Identified    Comments  Patient's initial QOL score was  20.47 and her PHQ-9 score was 1. No psychosocial barriers identified.     Expected Outcomes  Patient will have no psychosocial barriers identified at discharge and her QOL and PHQ-9 scores will improve.    Interventions  Relaxation education;Encouraged to  attend Pulmonary Rehabilitation for the exercise;Stress management education    Continue Psychosocial Services   No Follow up required        Education: Education Goals: Education classes will be provided on a weekly basis, covering required topics. Participant will state understanding/return demonstration of topics presented.  Learning Barriers/Preferences: Learning Barriers/Preferences - 03/04/17 1610      Learning Barriers/Preferences   Learning Barriers  None    Learning Preferences  Written Material;Pictoral       Education Topics: How Lungs Work and Diseases: - Discuss the anatomy of the lungs and diseases that can affect the lungs, such as COPD.   Exercise: -Discuss the importance of exercise, FITT principles of exercise, normal and abnormal responses to exercise, and how to exercise safely.   PULMONARY REHAB OTHER RESPIRATORY from 05/02/2017 in [place]  Date  05/02/17  Educator  [place]  Instruction Review Code  2- Demonstrated Understanding      Environmental Irritants: -Discuss types of environmental irritants and how to limit exposure to environmental irritants.   Meds/Inhalers and oxygen: - Discuss respiratory medications, definition of an inhaler and oxygen, and the proper way to use an inhaler and oxygen.   Energy Saving Techniques: - Discuss methods to conserve energy and decrease shortness of breath when performing activities of daily living.    Bronchial Hygiene / Breathing Techniques: - Discuss breathing mechanics, pursed-lip breathing technique,  proper posture, effective ways to clear airways, and other functional breathing techniques   Cleaning Equipment: - Provides group verbal and written instruction about the health risks of elevated stress, cause of high stress, and healthy ways to reduce stress.   PULMONARY REHAB OTHER RESPIRATORY from 05/02/2017 in [place]  Date  03/14/17  Educator  DWynetta Emery   Instruction Review Code  2- Demonstrated Understanding      Nutrition I: Fats: - Discuss the types of cholesterol, what cholesterol does to the body, and how cholesterol levels can be controlled.   PULMONARY REHAB OTHER RESPIRATORY from 05/02/2017 in [place]  Date  03/21/17  Educator  [place]  Instruction Review Code  2- Demonstrated Understanding      Nutrition II: Labels: -Discuss the different components of food labels and how to read food labels.   PULMONARY REHAB OTHER RESPIRATORY from 05/02/2017 in [place]  Date  03/28/17  Educator  [place]  Instruction Review Code  2- Demonstrated Understanding      Respiratory Infections: - Discuss the signs and symptoms of respiratory infections, ways to prevent respiratory infections, and the importance of seeking medical treatment when having a respiratory infection.   PULMONARY REHAB OTHER RESPIRATORY from 05/02/2017 in [place]  Date  04/04/17  Educator  [place]  Instruction Review Code  2- Demonstrated Understanding      Stress I: Signs and Symptoms: - Discuss the causes of stress, how stress may lead to anxiety and depression, and ways to limit stress.   PULMONARY REHAB OTHER RESPIRATORY from 05/02/2017 in [place]  Date  04/11/17  Educator  [place]  Instruction Review Code  2- Demonstrated Understanding      Stress II: Relaxation: -Discuss relaxation techniques to limit stress.  PULMONARY REHAB OTHER RESPIRATORY from 05/02/2017 in [place]  Date  04/18/17  Educator  [place]  Instruction Review Code  2- Demonstrated Understanding      Oxygen for Home/Travel: - Discuss how to prepare for travel when on oxygen and proper ways to transport and store oxygen to ensure safety.   PULMONARY REHAB OTHER RESPIRATORY from 05/02/2017 in [place]  Date  04/25/17  Educator  [place]  Instruction Review Code   2- Demonstrated Understanding      Knowledge Questionnaire Score: Knowledge Questionnaire Score - 03/04/17 0954      Knowledge Questionnaire Score   Pre Score  14/18       Core Components/Risk Factors/Patient Goals at Admission: Personal Goals and Risk Factors at Admission - 03/04/17 1018      Core Components/Risk Factors/Patient Goals on Admission    Weight Management  Weight Maintenance    Develop more efficient breathing techniques such as purse lipped breathing and diaphragmatic breathing; and practicing self-pacing with activity  Yes    Intervention  Provide education, demonstration and support about specific breathing techniuqes utilized for more efficient breathing. Include techniques such as pursed lipped breathing, diaphragmatic breathing and self-pacing activity.    Expected Outcomes  Short Term: Participant will be able to demonstrate and use breathing techniques as needed throughout daily activities.    Personal Goal Other  Yes    Personal Goal  Breath better, Be able to return to maintaining her home, taking care of husband and dog.     Intervention  Attend PR program 2 x week and supplement at home exercise 3 x week.     Expected Outcomes  Reach goals       Core Components/Risk Factors/Patient Goals Review:  Goals and Risk Factor Review    Row Name 04/18/17 1515 05/16/17 1452           Core Components/Risk Factors/Patient Goals Review   Personal Goals Review  Improve shortness of breath with ADL's;Weight Management/Obesity;Develop more efficient breathing techniques such as purse lipped breathing and diaphragmatic breathing and practicing self-pacing with activity.;Other Breathe better; get back to normal activities.   Improve shortness of breath with ADL's;Weight Management/Obesity;Develop more efficient breathing techniques such as purse lipped breathing and diaphragmatic breathing and practicing self-pacing with activity.;Other Breathe better; get back to normal  activities.       Review  Patient has completed 9 sessions with progression. She says she has learned how to pace herself with activities and how to do pursed lip breathing when she gets SOB with activities. She says she notices less SOB using stairs now. She says when she is doing house chores and she becomes SOB, she stops and sits down and does pursed lip breathing until she recovers. Overall, she says she program is helping her. Will cotinue to monitor for progress.   Patient has completed 12 sessions. She stopped attending 05/02/17 temporarily due having chest pain. She was evaluated in the ED and was referred to a cardiologist for possible CHF. She was advised not to return to PR until she is evaluated by cardiologist. Will continue to monitor for progress.       Expected Outcomes  Patient will continue to attend sessions consistently and complete the program meeting her personal goals.   Patient will be able to return to PR and complete the program meeting her personal goals.          Core Components/Risk Factors/Patient Goals at Discharge (Final Review):  Goals and Risk Factor Review - 05/16/17 1452      Core Components/Risk Factors/Patient Goals Review   Personal Goals Review  Improve shortness of breath with ADL's;Weight Management/Obesity;Develop more efficient breathing techniques such as purse lipped breathing and diaphragmatic breathing and practicing self-pacing with activity.;Other Breathe better; get back to normal activities.     Review  Patient has completed 12 sessions. She stopped attending 05/02/17 temporarily due having chest pain. She was evaluated in the ED and was referred to a cardiologist for possible CHF. She was advised not to return to PR until she is evaluated by cardiologist. Will continue to monitor for progress.     Expected Outcomes  Patient will be able to return to PR and complete the program meeting her personal goals.        ITP Comments: ITP Comments    Row  Name 03/20/17 0753 05/27/17 1259         ITP Comments  Patient new to program comleting 3 sessions. Will continue to monitor for progress.   Patient stopped coming after competing 12 sessions due to chest pain. She was seen in the ED and was diagnosed with CHF. She will not be able to return to the program until she is evaluated by a cardiologist. MD notified.          Comments: Patient stopped coming to Pulmonary Rehab on  05/02/17 after completing 12 sessions. She has to be evaluated by a cardiologist before she can return.  Doctor will be informed.

## 2017-05-27 NOTE — Addendum Note (Signed)
Encounter addended by: Dwana Melena, RN on: 05/27/2017 1:06 PM  Actions taken: Visit Navigator Flowsheet section accepted, Sign clinical note, Episode resolved

## 2017-05-27 NOTE — Progress Notes (Signed)
Discharge Progress Report  Patient Details  Name: Jordan Weaver MRN: [id number] Date of Birth: Apr 21, 1955 Referring Provider:     PULMONARY REHAB OTHER RESP ORIENTATION from 03/04/2017 in [place]  Referring Provider  Dr. Chase Caller       Number of Visits: 12  Reason for Discharge:  Early Exit:  Health.   Smoking History:  Social History   Tobacco Use  Smoking Status Never Smoker  Smokeless Tobacco Never Used    Diagnosis:  Dyspnea on exertion  ADL UCSD: Pulmonary Assessment Scores    Row Name 03/04/17 0954         ADL UCSD   ADL Phase  Entry     SOB Score total  70     Rest  0     Walk  8     Stairs  4     Bath  4     Dress  3     Shop  2       CAT Score   CAT Score  17       mMRC Score   mMRC Score  3        Initial Exercise Prescription: Initial Exercise Prescription - 03/04/17 1000      Date of Initial Exercise RX and Referring Provider   Date  03/04/17    Referring Provider  Dr. Chase Caller      Treadmill   MPH  1.3    Grade  0    Minutes  15    METs  1.9      NuStep   Level  2    SPM  81    Minutes  20    METs  1.9      Prescription Details   Frequency (times per week)  2    Duration  Progress to 30 minutes of continuous aerobic without signs/symptoms of physical distress      Intensity   THRR 40-80% of Max Heartrate  [phone]    Ratings of Perceived Exertion  11-13    Perceived Dyspnea  0-4      Progression   Progression  Continue progressive overload as per policy without signs/symptoms or physical distress.      Resistance Training   Training Prescription  Yes    Weight  1    Reps  10-15       Discharge Exercise Prescription (Final Exercise Prescription Changes): Exercise Prescription Changes - 05/13/17 1400      Response to Exercise   Blood Pressure (Admit)  108/68    Blood Pressure (Exercise)  110/82    Blood Pressure (Exit)  100/64    Heart Rate (Admit)  86 bpm    Heart Rate (Exercise)   97 bpm    Heart Rate (Exit)  97 bpm    Oxygen Saturation (Admit)  95 %    Oxygen Saturation (Exercise)  90 %    Oxygen Saturation (Exit)  96 %    Rating of Perceived Exertion (Exercise)  10    Perceived Dyspnea (Exercise)  10    Duration  Progress to 30 minutes of  aerobic without signs/symptoms of physical distress    Intensity  THRR New [phone]      Progression   Progression  Continue to progress workloads to maintain intensity without signs/symptoms of physical distress.      Resistance Training   Training Prescription  Yes    Weight  3    Reps  10-15      Treadmill   MPH  2.6    Grade  0    Minutes  15    METs  2.9      Recumbant Elliptical   Level  2    RPM  62    Watts  95    Minutes  20    METs  5.4      Home Exercise Plan   Plans to continue exercise at  Home (comment)    Frequency  Add 2 additional days to program exercise sessions.    Initial Home Exercises Provided  03/19/17       Functional Capacity: 6 Minute Walk    Row Name 03/04/17 1004         6 Minute Walk   Phase  Initial     Distance  1250 feet     Distance % Change  0 %     Distance Feet Change  0 ft     Walk Time  6 minutes     # of Rest Breaks  0     MPH  2.36     METS  2.81     RPE  10     Perceived Dyspnea   13     VO2 Peak  11.47     Symptoms  Yes (comment)     Comments  Chest discomfort at exertion     Resting HR  90 bpm     Resting BP  108/82     Resting Oxygen Saturation   95 %     Exercise Oxygen Saturation  during 6 min walk  90 %     Max Ex. HR  96 bpm     Max Ex. BP  126/80     2 Minute Post BP  110/84        Psychological, QOL, Others - Outcomes: PHQ 2/9: Depression screen PHQ 2/9 03/04/2017  Decreased Interest 0  Down, Depressed, Hopeless 0  PHQ - 2 Score 0  Altered sleeping 0  Tired, decreased energy 1  Change in appetite 0  Feeling bad or failure about yourself  0  Trouble concentrating 0  Moving slowly or fidgety/restless 0  Suicidal thoughts 0   PHQ-9 Score 1  Difficult doing work/chores Somewhat difficult    Quality of Life: Quality of Life - 03/04/17 1008      Quality of Life Scores   Health/Function Pre  15.5 %    Socioeconomic Pre  24.36 %    Psych/Spiritual Pre  23.36 %    Family Pre  28.5 %    GLOBAL Pre  20.47 %       Personal Goals: Goals established at orientation with interventions provided to work toward goal. Personal Goals and Risk Factors at Admission - 03/04/17 1018      Core Components/Risk Factors/Patient Goals on Admission    Weight Management  Weight Maintenance    Develop more efficient breathing techniques such as purse lipped breathing and diaphragmatic breathing; and practicing self-pacing with activity  Yes    Intervention  Provide education, demonstration and support about specific breathing techniuqes utilized for more efficient breathing. Include techniques such as pursed lipped breathing, diaphragmatic breathing and self-pacing activity.    Expected Outcomes  Short Term: Participant will be able to demonstrate and use breathing techniques as needed throughout daily activities.    Personal Goal Other  Yes    Personal Goal  Breath better, Be able  to return to maintaining her home, taking care of husband and dog.     Intervention  Attend PR program 2 x week and supplement at home exercise 3 x week.     Expected Outcomes  Reach goals        Personal Goals Discharge: Goals and Risk Factor Review    Row Name 04/18/17 1515 05/16/17 1452           Core Components/Risk Factors/Patient Goals Review   Personal Goals Review  Improve shortness of breath with ADL's;Weight Management/Obesity;Develop more efficient breathing techniques such as purse lipped breathing and diaphragmatic breathing and practicing self-pacing with activity.;Other Breathe better; get back to normal activities.   Improve shortness of breath with ADL's;Weight Management/Obesity;Develop more efficient breathing techniques such as  purse lipped breathing and diaphragmatic breathing and practicing self-pacing with activity.;Other Breathe better; get back to normal activities.       Review  Patient has completed 9 sessions with progression. She says she has learned how to pace herself with activities and how to do pursed lip breathing when she gets SOB with activities. She says she notices less SOB using stairs now. She says when she is doing house chores and she becomes SOB, she stops and sits down and does pursed lip breathing until she recovers. Overall, she says she program is helping her. Will cotinue to monitor for progress.   Patient has completed 12 sessions. She stopped attending 05/02/17 temporarily due having chest pain. She was evaluated in the ED and was referred to a cardiologist for possible CHF. She was advised not to return to PR until she is evaluated by cardiologist. Will continue to monitor for progress.       Expected Outcomes  Patient will continue to attend sessions consistently and complete the program meeting her personal goals.   Patient will be able to return to PR and complete the program meeting her personal goals.          Exercise Goals and Review: Exercise Goals    Row Name 03/04/17 1006             Exercise Goals   Increase Physical Activity  Yes       Intervention  Provide advice, education, support and counseling about physical activity/exercise needs.;Develop an individualized exercise prescription for aerobic and resistive training based on initial evaluation findings, risk stratification, comorbidities and participant's personal goals.       Expected Outcomes  Achievement of increased cardiorespiratory fitness and enhanced flexibility, muscular endurance and strength shown through measurements of functional capacity and personal statement of participant.       Increase Strength and Stamina  Yes       Intervention  Develop an individualized exercise prescription for aerobic and resistive  training based on initial evaluation findings, risk stratification, comorbidities and participant's personal goals.;Provide advice, education, support and counseling about physical activity/exercise needs.       Expected Outcomes  Achievement of increased cardiorespiratory fitness and enhanced flexibility, muscular endurance and strength shown through measurements of functional capacity and personal statement of participant.       Able to understand and use rate of perceived exertion (RPE) scale  Yes       Intervention  Provide education and explanation on how to use RPE scale       Expected Outcomes  Short Term: Able to use RPE daily in rehab to express subjective intensity level;Long Term:  Able to use RPE to guide intensity level when  exercising independently       Able to understand and use Dyspnea scale  Yes       Intervention  Provide education and explanation on how to use Dyspnea scale       Expected Outcomes  Short Term: Able to use Dyspnea scale daily in rehab to express subjective sense of shortness of breath during exertion;Long Term: Able to use Dyspnea scale to guide intensity level when exercising independently       Knowledge and understanding of Target Heart Rate Range (THRR)  Yes       Intervention  Provide education and explanation of THRR including how the numbers were predicted and where they are located for reference       Expected Outcomes  Short Term: Able to state/look up THRR       Able to check pulse independently  Yes       Intervention  Provide education and demonstration on how to check pulse in carotid and radial arteries.;Review the importance of being able to check your own pulse for safety during independent exercise       Expected Outcomes  Short Term: Able to explain why pulse checking is important during independent exercise;Long Term: Able to check pulse independently and accurately       Understanding of Exercise Prescription  Yes       Intervention  Provide  education, explanation, and written materials on patient's individual exercise prescription       Expected Outcomes  Short Term: Able to explain program exercise prescription;Long Term: Able to explain home exercise prescription to exercise independently          Nutrition & Weight - Outcomes: Pre Biometrics - 03/04/17 1007      Pre Biometrics   Height  5' 7"  (1.702 m)    Weight  164 lb 9.6 oz (74.7 kg)    Waist Circumference  34.5 inches    Hip Circumference  39 inches    Waist to Hip Ratio  0.88 %    BMI (Calculated)  25.77    Triceps Skinfold  18 mm    % Body Fat  34.6 %    Grip Strength  65.4 kg    Flexibility  0 in    Single Leg Stand  14 seconds        Nutrition: Nutrition Therapy & Goals - 04/04/17 1304      Personal Nutrition Goals   Nutrition Goal  For heart healthy choices add >50% of whole grains, make half their plate fruits and vegetables. Discuss the difference between starchy vegetables and leafy greens, and how leafy vegetables provide fiber, helps maintain healthy weight, helps control blood glucose, and lowers cholesterol.  Discuss purchasing fresh or frozen vegetable to reduce sodium and not to add grease, fat or sugar. Consume <18oz of red meat per week. Consume lean cuts of meats and very little of meats high in sodium and nitrates such as pork and lunch meats. Discussed portion control for all food groups      Intervention Plan   Intervention  Nutrition handout(s) given to patient.    Expected Outcomes  Short Term Goal: Understand basic principles of dietary content, such as calories, fat, sodium, cholesterol and nutrients.;Short Term Goal: A plan has been developed with personal nutrition goals set during dietitian appointment.       Nutrition Discharge: Nutrition Assessments - 03/04/17 1018      MEDFICTS Scores   Pre Score  39  Education Questionnaire Score: Knowledge Questionnaire Score - 03/04/17 0954      Knowledge Questionnaire Score    Pre Score  14/18

## 2017-05-28 ENCOUNTER — Encounter (HOSPITAL_COMMUNITY): Payer: BLUE CROSS/BLUE SHIELD

## 2017-05-30 ENCOUNTER — Encounter (HOSPITAL_COMMUNITY): Payer: BLUE CROSS/BLUE SHIELD

## 2017-06-04 ENCOUNTER — Encounter (HOSPITAL_COMMUNITY): Payer: BLUE CROSS/BLUE SHIELD

## 2017-06-06 ENCOUNTER — Encounter (HOSPITAL_COMMUNITY): Payer: BLUE CROSS/BLUE SHIELD

## 2017-06-10 ENCOUNTER — Encounter: Payer: Self-pay | Admitting: Cardiology

## 2017-06-10 ENCOUNTER — Ambulatory Visit: Payer: BLUE CROSS/BLUE SHIELD | Admitting: Cardiology

## 2017-06-10 VITALS — BP 120/92 | HR 90 | Ht 67.0 in | Wt 160.0 lb

## 2017-06-10 DIAGNOSIS — R0789 Other chest pain: Secondary | ICD-10-CM

## 2017-06-10 DIAGNOSIS — I5032 Chronic diastolic (congestive) heart failure: Secondary | ICD-10-CM

## 2017-06-10 NOTE — Patient Instructions (Addendum)
Your physician wants you to follow-up in:4 months with [facility]     Your physician recommends that you continue on your current medications as directed. Please refer to the Current Medication list given to you today.    If you need a refill on your cardiac medications before your next appointment, please call your pharmacy.     No lab work or testing ordered.     Thank you for choosing [facility] !

## 2017-06-10 NOTE — Progress Notes (Signed)
Clinical Summary Ms. Galster is a 62 y.o.female seen as new consult, referred by Dr Scotty Court for chest pain  1. Chest pain - midchest pressure/tightness. Up to 6-7/10 in severity. No other associated pain. Not positional - constant pain x 2 days without relief. Can be tender to palpation.  - treated with topical lidocaine patch by pcp with some improvement.    2. Chronic diastolic HF - has been on lasix, recently increased to 4m daily - reports her DOE has been increasing, particularly with increased regular exertion.  - 12/2016 echo: LVEF 60-65%, no WMAs, grade II diastoilc dysfunction.   SH: her sister is TTye Savoyalso a patient of mine    Past Medical History:  Diagnosis Date  . Celiac disease   . GERD (gastroesophageal reflux disease)   . Hypertension      Allergies  Allergen Reactions  . Codeine Nausea And Vomiting     Current Outpatient Medications  Medication Sig Dispense Refill  . aspirin 81 MG tablet Take 81 mg by mouth daily.    . benazepril (LOTENSIN) 10 MG tablet 10 mg daily.   3  . betamethasone dipropionate (DIPROLENE) 0.05 % cream Apply topically 2 (two) times daily. 30 g 4  . DULoxetine (CYMBALTA) 30 MG capsule Take 30 mg by mouth 2 (two) times daily.     . furosemide (LASIX) 40 MG tablet Take 1 tablet (40 mg total) daily by mouth. 60 tablet 3  . gabapentin (NEURONTIN) 100 MG capsule Take 100 mg by mouth 2 (two) times daily.     .Marland Kitchenomeprazole (PRILOSEC) 20 MG capsule Take 20 mg by mouth daily.    . Potassium Chloride ER 20 MEQ TBCR Take 20 mEq daily by mouth. 60 tablet 3  . spironolactone (ALDACTONE) 25 MG tablet Take 25 mg by mouth daily.      No current facility-administered medications for this visit.      Past Surgical History:  Procedure Laterality Date  . BREAST SURGERY     knot removed  . DILATION AND CURETTAGE OF UTERUS     multpile  . ROTATOR CUFF REPAIR    . SALPINGOOPHORECTOMY Left 1975  . SPINE SURGERY       Allergies   Allergen Reactions  . Codeine Nausea And Vomiting      Family History  Problem Relation Age of Onset  . Asthma Mother   . Cancer Mother        breast  . Hypertension Mother   . Arthritis Mother   . Cancer Father        colon  . Kidney disease Father   . Cancer Sister        breast  . Hypertension Sister      Social History Ms. Farrelly reports that  has never smoked. she has never used smokeless tobacco. Ms. JJardinreports that she does not drink alcohol.   Review of Systems CONSTITUTIONAL: No weight loss, fever, chills, weakness or fatigue.  HEENT: Eyes: No visual loss, blurred vision, double vision or yellow sclerae.No hearing loss, sneezing, congestion, runny nose or sore throat.  SKIN: No rash or itching.  CARDIOVASCULAR: per hpi RESPIRATORY: per hpi GASTROINTESTINAL: No anorexia, nausea, vomiting or diarrhea. No abdominal pain or blood.  GENITOURINARY: No burning on urination, no polyuria NEUROLOGICAL: No headache, dizziness, syncope, paralysis, ataxia, numbness or tingling in the extremities. No change in bowel or bladder control.  MUSCULOSKELETAL: No muscle, back pain, joint pain or stiffness.  LYMPHATICS:  No enlarged nodes. No history of splenectomy.  PSYCHIATRIC: No history of depression or anxiety.  ENDOCRINOLOGIC: No reports of sweating, cold or heat intolerance. No polyuria or polydipsia.  [name]   Physical Examination Vitals:   06/10/17 1259 06/10/17 1305  BP: 120/90 (!) 120/92  Pulse: 93 90  SpO2: 93% 95%   Vitals:   06/10/17 1259  Weight: 160 lb (72.6 kg)  Height: 5' 7"  (1.702 m)    Gen: resting comfortably, no acute distress HEENT: no scleral icterus, pupils equal round and reactive, no palptable cervical adenopathy,  CV: RRR, no m/r/g, no jvd Resp: Clear to auscultation bilaterally GI: abdomen is soft, non-tender, non-distended, normal bowel sounds, no hepatosplenomegaly MSK: extremities are warm, no edema.  Skin: warm, no rash Neuro:  no focal  deficits Psych: appropriate affect   Diagnostic Studies  12/2016 echo Study Conclusions  - Left ventricle: The cavity size was normal. Wall thickness was   increased in a pattern of mild LVH. Systolic function was normal.   The estimated ejection fraction was in the range of 60% to 65%.   Wall motion was normal; there were no regional wall motion   abnormalities. Features are consistent with a pseudonormal left   ventricular filling pattern, with concomitant abnormal relaxation   and increased filling pressure (grade 2 diastolic dysfunction). - Pulmonary arteries: Systolic pressure was mildly increased. PA   peak pressure: 35 mm Hg (S).   Assessment and Plan  1. Atypical chest pain - chest pain is not consistent with cardiac etiology - no plans for ischemic testing at this time, continue to monitor   2. Chronic diastolic HF - echo with grade II diastolic dysfunction. She has been on lasix at home with recent increase in dosing.  -appears euvoelmic, continue current diuretic   F/u 4 months    Arnoldo Lenis, M.D

## 2017-06-11 ENCOUNTER — Encounter (HOSPITAL_COMMUNITY): Payer: BLUE CROSS/BLUE SHIELD

## 2017-06-12 ENCOUNTER — Encounter: Payer: Self-pay | Admitting: Cardiology

## 2017-06-13 ENCOUNTER — Encounter (HOSPITAL_COMMUNITY): Payer: BLUE CROSS/BLUE SHIELD

## 2017-06-17 ENCOUNTER — Encounter: Payer: Self-pay | Admitting: Internal Medicine

## 2017-06-17 ENCOUNTER — Ambulatory Visit: Payer: BLUE CROSS/BLUE SHIELD | Admitting: Internal Medicine

## 2017-06-17 VITALS — BP 110/70 | HR 94 | Ht 67.0 in | Wt 159.0 lb

## 2017-06-17 DIAGNOSIS — R0609 Other forms of dyspnea: Secondary | ICD-10-CM | POA: Diagnosis not present

## 2017-06-17 DIAGNOSIS — R490 Dysphonia: Secondary | ICD-10-CM | POA: Diagnosis not present

## 2017-06-17 DIAGNOSIS — J986 Disorders of diaphragm: Secondary | ICD-10-CM

## 2017-06-17 NOTE — Progress Notes (Signed)
Subjective:     Patient ID: Jordan Weaver, female   DOB: [birth year], 62 y.o.   MRN: [id number]  HPI IOV 01/22/2017  Chief Complaint  Patient presents with  . Advice Only    abnormal CXR right lung, CT scan collapsed lung. shoulder surgery july '18, mod dyspnea on exertoin   62 year old female new consult. She is here with her husband. 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 She tells me that [place] she had left shoulder surgery and after that she started noticing new onset of shortness of breathfor activities much lighter than before. She tells me that she gets dyspneic now cleaning bathrooms all walking upstairs relieved by rest. This no associated cough or wheezing. She has a family history of asthma and she's been on ACE inhibitors for 20 years but she does not have any cough or wheezing. The onset of dyspnea is definitely related to the left shoulder surgery. This no orthopnea paroxysmal nocturnal dyspnea or cough or hemoptysis. This no fever or weight loss. She underwent evaluation by her primary care physician. She had a CT chest with contrast. Images are not available for my visualization. Date of test is 01/11/2017. The report shows that she has markedly elevation of right diaphragm associated with right middle lobe and right lower lobe atelectasis. In addition she has fatty livershe also has an aberrant right subclavian artery. She denies any chest pain or previous cardiac workup. There is no weight loss  Walking desaturation test done today on room air with a full head probe. Resting pulse ox 95%. Resting heart rate 92%. Final heart rate 104/m. Resting pulse ox 94%. Did not desaturate.   OV 02/14/2017  Chief Complaint  Patient presents with  . Follow-up    Pt states that she is the same as she was at last visit. States that she is still becoming SOB when doing  minor activities but has learned to slow down, coughing usually when laying down at night, and occ. CP.   Fu test results. Acute dyspnea following sholder surgery on left side.   1. 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 ECho  - stiff heart muscle that does not relax well: pressure (grade 2 diastolic dysfunction). - Pulmonary arteries: Systolic pressure was mildly increased. PA   peak pressure: 35 mm Hg (S)  3. D-dimer high but borderline: Duplex LE Ultrasound - negative for DVT.    4. FeNO 02/14/2017 -= 22pb and normal   OV 03/18/2017  Chief Complaint  Patient presents with  . Follow-up    Pt has begun rehab at [facility] and stated that since she has neuropathy she does not know how long she can do it.  Pt has had some tightness in chest x2 days and does have SOB when doing activities.  Denies any cough.     Follow-up dyspnea due to paralyzed right hemidiaphragm and also diastolic dysfunction  Since her last visit 5 weeks ago she's lost weight. She is also taking Lasix. These measures seemed to have helped her dyspnea little bit only. Nevertheless it is definitely a little bit better. She just started pulmonary rehabilitation 1 class and did well. 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 She status post back surgeries 20 years ago but has been dealing with bilateral feet neuropathy only in the last 1 year. She's  not interested in second opinion for dia   05/07/2017 Acute OV : Chest pain  Patient presents for an acute office visit.  Patient complains of 3 days of mid chest pain and tightness.  She says that she started having some tightness in the middle part of her chest that is been pretty constant over the last 3 days.  It is sore to touch.  She has no associated increased dyspnea, cough, hemoptysis or increased edema.  She is not taking any medications for relief.  She denies any radiating pains into her shoulder or back or neck.  Patient says she  does have a family history of heart disease in a sister and father. EKG today shows NSR , nonspecific changes .   Patient is followed for chronic dyspnea felt secondary to a paralyzed right   hemidiaphragm and diastolic heart failure.  She is currently involved in pulmonary rehab.  She says overall breathing shortness of breath are back at baseline.  She is not seeing significant improvement in her dyspnea since starting pulmonary rehab.  Does feel like that she is more conditioned.   phragmatic paralysis  OV 06/17/2017  Chief Complaint  Patient presents with  . Follow-up    Pt states she has been having a rough time. States she is more problems with SOB even doing little things such as changing sheets on bed, has occ. cough.   Follow-up paralyzed right diaphragm after surgery?  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  At this point in time she tells me that she is no better.  She has tried pulmonary rehabilitation [currently on hold because of needing clearance from Dr. Harl Bowie cardiology] she is on Lasix and she has lost some weight and done exercise but despite all this dyspnea persist.  Vacuuming doing household work or any kind of exertion like walking uphill with the dog makes her dyspneic.  She is applied for disability and it is that bad.  She is also reporting hoarseness of voice that she tells me has been present ever since the surgery associated with the frozen diaphragm.  She tells me that she spoke to the anesthesiologist who did not report any adverse events at the time of surgery or after the surgery.  The hoarseness of voice persists.  She has not seen ENT for this.  She is open to getting a second opinion.  I gave the CD-ROM of a CT scan of the chest back to her.  In February 2019 she had a chest x-ray that still shows elevated right hemidiaphragm.     has a past medical history of Celiac disease, GERD (gastroesophageal reflux  disease), and Hypertension.   reports that  has never smoked. she has never used smokeless tobacco.  Past Surgical History:  Procedure Laterality Date  . BREAST SURGERY     knot removed  . DILATION AND CURETTAGE OF UTERUS     multpile  . ROTATOR CUFF REPAIR    . SALPINGOOPHORECTOMY Left 1975  . SPINE SURGERY      Allergies  Allergen Reactions  . Codeine Nausea And Vomiting  . Hydrocodone Nausea And Vomiting and Rash  . Tramadol Nausea And Vomiting    Immunization History  Administered Date(s) Administered  . Influenza-Unspecified 01/31/2017    Family History  Problem Relation Age of Onset  . Asthma Mother   . Cancer Mother        breast  . Hypertension Mother   . Arthritis  Mother   . Cancer Father        colon  . Kidney disease Father   . Cancer Sister        breast  . Hypertension Sister      Current Outpatient Medications:  .  aspirin 81 MG tablet, Take 81 mg by mouth daily., Disp: , Rfl:  .  benazepril (LOTENSIN) 10 MG tablet, 10 mg daily. , Disp: , Rfl: 3 .  betamethasone dipropionate (DIPROLENE) 0.05 % cream, Apply topically 2 (two) times daily., Disp: 30 g, Rfl: 4 .  DULoxetine (CYMBALTA) 30 MG capsule, Take 30 mg by mouth 2 (two) times daily. , Disp: , Rfl:  .  furosemide (LASIX) 40 MG tablet, Take 1 tablet (40 mg total) daily by mouth., Disp: 60 tablet, Rfl: 3 .  gabapentin (NEURONTIN) 100 MG capsule, Take 100 mg by mouth 3 (three) times daily. , Disp: , Rfl:  .  omeprazole (PRILOSEC) 20 MG capsule, Take 20 mg by mouth daily., Disp: , Rfl:  .  Potassium Chloride ER 20 MEQ TBCR, Take 20 mEq daily by mouth., Disp: 60 tablet, Rfl: 3 .  spironolactone (ALDACTONE) 25 MG tablet, Take 25 mg by mouth daily. , Disp: , Rfl:  .  TESTOSTERONE NA, Apply 1 application topically 3 (three) times a week. 2% cream, Disp: , Rfl:    Review of Systems     Objective:   Physical Exam  Constitutional: She is oriented to person, place, and time. She appears well-developed  and well-nourished. No distress.  HENT:  Head: Normocephalic and atraumatic.  Right Ear: External ear normal.  Left Ear: External ear normal.  Mouth/Throat: Oropharynx is clear and moist. No oropharyngeal exudate.  Hoarse voice  Eyes: Conjunctivae and EOM are normal. Pupils are equal, round, and reactive to light. Right eye exhibits no discharge. Left eye exhibits no discharge. No scleral icterus.  Neck: Normal range of motion. Neck supple. No JVD present. No tracheal deviation present. No thyromegaly present.  Cardiovascular: Normal rate, regular rhythm, normal heart sounds and intact distal pulses. Exam reveals no gallop and no friction rub.  No murmur heard. Pulmonary/Chest: Effort normal and breath sounds normal. No respiratory distress. She has no wheezes. She has no rales. She exhibits no tenderness.  Reduced air entry right base  Abdominal: Soft. Bowel sounds are normal. She exhibits no distension and no mass. There is no tenderness. There is no rebound and no guarding.  Musculoskeletal: Normal range of motion. She exhibits no edema or tenderness.  Lymphadenopathy:    She has no cervical adenopathy.  Neurological: She is alert and oriented to person, place, and time. She has normal reflexes. No cranial nerve deficit. She exhibits normal muscle tone. Coordination normal.  Skin: Skin is warm and dry. No rash noted. She is not diaphoretic. No erythema. No pallor.  Psychiatric: She has a normal mood and affect. Her behavior is normal. Judgment and thought content normal.  Vitals reviewed.  Vitals:   06/17/17 1222  BP: 110/70  Pulse: 94  SpO2: 100%  Weight: 159 lb (72.1 kg)  Height: 5' 7"  (1.702 m)       Assessment:       ICD-10-CM   1. Dyspnea on exertion R06.09   2. 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 Hoarseness of voice R49.0        Plan:     Dyspnea on exertion Elevated diaphragm  - diaphragm still paralyzed  - refer [facility] for 2nd opinion; take your  CD ROM of  CT with you to [facility]    Hoarseness of voice - ongoing since surgery  - refer ENT - I think Dr Elgie Congo goes to [place]. If so you can see him  Followup 3 months or sooner if needed   > 50% of this > 25 min visit spent in face to face counseling or coordination of care    Dr. Brand Males, M.D., [facility].C.P Pulmonary and Critical Care Medicine Staff Physician, [place] Director - Interstitial Lung Disease  Program  Pulmonary [place] at [place], [place], [address]  Pager: [phone], If no answer or between  15:00h - 7:00h: call [phone] Telephone: [phone]

## 2017-06-17 NOTE — Patient Instructions (Signed)
Dyspnea on exertion Elevated diaphragm  - diaphragm still paralyzed  - refer [place] for 2nd opinion; take your CD ROM of CT with you to [facility]    Hoarseness of voice - ongoing since surgery  - refer ENT - I think Dr Elgie Congo goes to [place]. If so you can see him  Followup 3 months or sooner if needed

## 2017-06-18 ENCOUNTER — Encounter (HOSPITAL_COMMUNITY): Payer: BLUE CROSS/BLUE SHIELD

## 2017-06-20 ENCOUNTER — Encounter (HOSPITAL_COMMUNITY): Payer: BLUE CROSS/BLUE SHIELD

## 2017-06-25 ENCOUNTER — Encounter (HOSPITAL_COMMUNITY): Payer: BLUE CROSS/BLUE SHIELD

## 2017-06-27 ENCOUNTER — Encounter (HOSPITAL_COMMUNITY): Payer: BLUE CROSS/BLUE SHIELD

## 2017-07-01 ENCOUNTER — Telehealth (INDEPENDENT_AMBULATORY_CARE_PROVIDER_SITE_OTHER): Payer: Self-pay | Admitting: *Deleted

## 2017-07-01 ENCOUNTER — Encounter (INDEPENDENT_AMBULATORY_CARE_PROVIDER_SITE_OTHER): Payer: Self-pay | Admitting: *Deleted

## 2017-07-01 MED ORDER — PEG 3350-KCL-NA BICARB-NACL 420 G PO SOLR: 4000.0000 mL | Freq: Once | ORAL | 0 refills | Status: AC

## 2017-07-01 NOTE — Telephone Encounter (Signed)
Patient need trilyte

## 2017-07-02 ENCOUNTER — Encounter (HOSPITAL_COMMUNITY): Payer: BLUE CROSS/BLUE SHIELD

## 2017-07-03 ENCOUNTER — Ambulatory Visit: Payer: BLUE CROSS/BLUE SHIELD | Admitting: Cardiology

## 2017-07-04 ENCOUNTER — Encounter (HOSPITAL_COMMUNITY): Payer: BLUE CROSS/BLUE SHIELD

## 2017-07-09 ENCOUNTER — Encounter (HOSPITAL_COMMUNITY): Payer: BLUE CROSS/BLUE SHIELD

## 2017-07-11 ENCOUNTER — Encounter (HOSPITAL_COMMUNITY): Payer: BLUE CROSS/BLUE SHIELD

## 2017-07-15 ENCOUNTER — Ambulatory Visit (INDEPENDENT_AMBULATORY_CARE_PROVIDER_SITE_OTHER): Payer: BLUE CROSS/BLUE SHIELD | Admitting: Otolaryngology

## 2017-07-15 DIAGNOSIS — J382 Nodules of vocal cords: Secondary | ICD-10-CM

## 2017-07-15 DIAGNOSIS — R490 Dysphonia: Secondary | ICD-10-CM | POA: Diagnosis not present

## 2017-07-26 ENCOUNTER — Telehealth (INDEPENDENT_AMBULATORY_CARE_PROVIDER_SITE_OTHER): Payer: Self-pay | Admitting: *Deleted

## 2017-07-26 NOTE — Telephone Encounter (Signed)
Referring MD/PCP: [facility]   Procedure: tcs  Reason/Indication:  screening  Has patient had this procedure before?  Yes, over 10 yrs ago  If so, when, by whom and where?    Is there a family history of colon cancer?  no  Who?  What age when diagnosed?    Is patient diabetic?   no      Does patient have prosthetic heart valve or mechanical valve?  no  Do you have a pacemaker?  no  Has patient ever had endocarditis? no  Has patient had joint replacement within last 12 months?  no  Is patient constipated or do they take laxatives? no  Does patient have a history of alcohol/drug use?  no  Is patient on blood thinner such as Coumadin, Plavix and/or Aspirin? yes  Medications: asa 81 mg daily, benazepril 10 mg daily, spironolactone 25 mg daily, furosemide 40 mg daily, omeprazole 20 mg daily, duloxetine 30 mg bid, gabapentin 100 mg tid, potassium 20 meq daily  Allergies: codeine, tramadol, hydrocodone  Medication Adjustment per Dr Lindi Adie, NP: asa 2 days  Procedure date & time: 08/21/17 at 100

## 2017-07-29 NOTE — Telephone Encounter (Signed)
agree

## 2017-08-07 ENCOUNTER — Telehealth: Payer: Self-pay | Admitting: Internal Medicine

## 2017-08-07 NOTE — Telephone Encounter (Signed)
Attempted to contact pt. Received a busy signal x2. Will try back.

## 2017-08-08 NOTE — Telephone Encounter (Signed)
I have looked in MR's look-ats and did not see [facility] records for pt. I was not able to see any pertinent information in Care Everywhere either.  Called and spoke to pt. She states she will call [facility] and get records faxed today. Will forward to [facility] to keep any eye out for the records.

## 2017-08-09 NOTE — Telephone Encounter (Signed)
Still no sign of records in MR's lookat  However, Dr Allayne Gitelman note is now in [place] and a spirometry report (no graph)  MR would this be okay to print you a copy of?  Will be happy to print and place in your lookat if you would like.

## 2017-08-14 NOTE — Telephone Encounter (Signed)
I have received records from Dr. Allayne Gitelman office.  Will give them to MR for him to view. Called and spoke with pt letting her know we had received them. Pt expressed understanding.   Nothing further needed at this time.

## 2017-08-14 NOTE — Telephone Encounter (Signed)
MR please advise. Thanks! 

## 2017-08-21 ENCOUNTER — Other Ambulatory Visit: Payer: Self-pay

## 2017-08-21 ENCOUNTER — Ambulatory Visit (HOSPITAL_COMMUNITY)
Admission: RE | Admit: 2017-08-21 | Discharge: 2017-08-21 | Disposition: A | Discharge status: Home Health | Payer: BLUE CROSS/BLUE SHIELD | Source: Ambulatory Visit | Attending: Internal Medicine | Admitting: Internal Medicine

## 2017-08-21 ENCOUNTER — Encounter (HOSPITAL_COMMUNITY)
Admission: RE | Disposition: A | Discharge status: Home Health | Payer: Self-pay | Source: Ambulatory Visit | Attending: Internal Medicine

## 2017-08-21 ENCOUNTER — Encounter (HOSPITAL_COMMUNITY): Payer: Self-pay | Admitting: *Deleted

## 2017-08-21 DIAGNOSIS — K219 Gastro-esophageal reflux disease without esophagitis: Secondary | ICD-10-CM | POA: Insufficient documentation

## 2017-08-21 DIAGNOSIS — K6389 Other specified diseases of intestine: Secondary | ICD-10-CM | POA: Diagnosis not present

## 2017-08-21 DIAGNOSIS — Q438 Other specified congenital malformations of intestine: Secondary | ICD-10-CM | POA: Insufficient documentation

## 2017-08-21 DIAGNOSIS — Z800 Family history of malignant neoplasm of digestive organs: Secondary | ICD-10-CM | POA: Insufficient documentation

## 2017-08-21 DIAGNOSIS — I10 Essential (primary) hypertension: Secondary | ICD-10-CM | POA: Diagnosis not present

## 2017-08-21 DIAGNOSIS — Z885 Allergy status to narcotic agent status: Secondary | ICD-10-CM | POA: Insufficient documentation

## 2017-08-21 DIAGNOSIS — Z79899 Other long term (current) drug therapy: Secondary | ICD-10-CM | POA: Insufficient documentation

## 2017-08-21 DIAGNOSIS — K644 Residual hemorrhoidal skin tags: Secondary | ICD-10-CM | POA: Insufficient documentation

## 2017-08-21 DIAGNOSIS — Z7982 Long term (current) use of aspirin: Secondary | ICD-10-CM | POA: Diagnosis not present

## 2017-08-21 DIAGNOSIS — K900 Celiac disease: Secondary | ICD-10-CM | POA: Insufficient documentation

## 2017-08-21 DIAGNOSIS — D123 Benign neoplasm of transverse colon: Secondary | ICD-10-CM | POA: Diagnosis not present

## 2017-08-21 DIAGNOSIS — K6289 Other specified diseases of anus and rectum: Secondary | ICD-10-CM | POA: Insufficient documentation

## 2017-08-21 DIAGNOSIS — Z1211 Encounter for screening for malignant neoplasm of colon: Secondary | ICD-10-CM | POA: Insufficient documentation

## 2017-08-21 HISTORY — PX: POLYPECTOMY: SHX5525

## 2017-08-21 HISTORY — PX: COLONOSCOPY: SHX5424

## 2017-08-21 SURGERY — COLONOSCOPY
Anesthesia: Moderate Sedation

## 2017-08-21 MED ORDER — MIDAZOLAM HCL 5 MG/5ML IJ SOLN
INTRAMUSCULAR | Status: DC | PRN
  Administered 2017-08-21: 2 mg via INTRAVENOUS
  Administered 2017-08-21: 1 mg via INTRAVENOUS
  Administered 2017-08-21: 2 mg via INTRAVENOUS

## 2017-08-21 MED ORDER — STERILE WATER FOR IRRIGATION IR SOLN
Status: DC | PRN
  Administered 2017-08-21: 13:00:00

## 2017-08-21 MED ORDER — SODIUM CHLORIDE 0.9 % IV SOLN
INTRAVENOUS | Status: DC
  Administered 2017-08-21: 12:00:00 via INTRAVENOUS

## 2017-08-21 MED ORDER — MEPERIDINE HCL 50 MG/ML IJ SOLN
INTRAMUSCULAR | Status: AC
  Filled 2017-08-21: qty 1

## 2017-08-21 MED ORDER — MEPERIDINE HCL 50 MG/ML IJ SOLN
INTRAMUSCULAR | Status: DC | PRN
  Administered 2017-08-21 (×2): 25 mg via INTRAVENOUS

## 2017-08-21 MED ORDER — MIDAZOLAM HCL 5 MG/5ML IJ SOLN
INTRAMUSCULAR | Status: AC
  Filled 2017-08-21: qty 10

## 2017-08-21 NOTE — H&P (Signed)
Jordan Weaver is an 62 y.o. female.   Chief Complaint: Patient is here for colonoscopy. HPI: Patient 62 year old Caucasian female who is here for screening colonoscopy.  Her last exam was normal in October 2008.  She denies abdominal pain change in bowel habits or rectal bleeding. Family history is negative for CRC.  Past Medical History:  Diagnosis Date  . Celiac disease   . GERD (gastroesophageal reflux disease)   . Hypertension     Past Surgical History:  Procedure Laterality Date  . BREAST SURGERY     knot removed  . DILATION AND CURETTAGE OF UTERUS     multpile  . ROTATOR CUFF REPAIR    . SALPINGOOPHORECTOMY Left 1975  . SPINE SURGERY      Family History  Problem Relation Age of Onset  . Asthma Mother   . Cancer Mother        breast  . Hypertension Mother   . Arthritis Mother   . Cancer Father        colon  . Kidney disease Father   . Cancer Sister        breast  . Hypertension Sister    Social History:  reports that she has never smoked. She has never used smokeless tobacco. She reports that she does not drink alcohol or use drugs.  Allergies:  Allergies  Allergen Reactions  . Codeine Nausea And Vomiting  . Hydrocodone Nausea And Vomiting and Rash  . Tramadol Nausea And Vomiting    Medications Prior to Admission  Medication Sig Dispense Refill  . aspirin 81 MG tablet Take 81 mg by mouth daily.    . benazepril (LOTENSIN) 10 MG tablet 10 mg daily.   3  . betamethasone dipropionate (DIPROLENE) 0.05 % cream Apply topically 2 (two) times daily. 30 g 4  . DULoxetine (CYMBALTA) 30 MG capsule Take 30 mg by mouth 2 (two) times daily.     . furosemide (LASIX) 40 MG tablet Take 1 tablet (40 mg total) daily by mouth. 60 tablet 3  . gabapentin (NEURONTIN) 100 MG capsule Take 100 mg by mouth 2 (two) times daily.     [name] omeprazole (PRILOSEC) 20 MG capsule Take 20 mg by mouth daily.    . Potassium Chloride ER 20 MEQ TBCR Take 20 mEq daily by mouth. 60 tablet 3  .  spironolactone (ALDACTONE) 25 MG tablet Take 25 mg by mouth daily.     . TESTOSTERONE NA Apply 1 application topically 3 (three) times a week. 2% cream Monday, Wednesday & Friday      No results found for this or any previous visit (from the past 75 hour(s)). No results found.  ROS  Blood pressure 115/81, pulse 81, temperature 97.9 F (36.6 C), temperature source Oral, resp. rate (!) 23, height 5' 6"  (1.676 m), weight 157 lb (71.2 kg), SpO2 96 %. Physical Exam  Constitutional: She appears well-developed and well-nourished.  HENT:  Mouth/Throat: Oropharynx is clear and moist.  Eyes: Conjunctivae are normal. No scleral icterus.  Neck: No thyromegaly present.  Cardiovascular: Normal rate, regular rhythm and normal heart sounds.  No murmur heard. Respiratory: Effort normal and breath sounds normal.  GI:  Abdomen is symmetrical.  Lower midline scar noted.  Abdomen is soft and nontender without organomegaly or masses  Musculoskeletal: She exhibits no edema.  Lymphadenopathy:    She has no cervical adenopathy.  Neurological: She is alert.  Skin: Skin is warm and dry.     Assessment/Plan Average risk  screening colonoscopy.  Hildred Laser, MD 08/21/2017, 12:34 PM

## 2017-08-21 NOTE — Discharge Instructions (Signed)
Resume aspirin on 08/22/2017. Resume other medications and diet as before. No driving for 24 hours. Physician will call with biopsy results.   Colonoscopy, Adult, Care After This sheet gives you information about how to care for yourself after your procedure. Your health care provider may also give you more specific instructions. If you have problems or questions, contact your health care provider. What can I expect after the procedure? After the procedure, it is common to have:  A small amount of blood in your stool for 24 hours after the procedure.  Some gas.  Mild abdominal cramping or bloating.  Follow these instructions at home: General instructions   For the first 24 hours after the procedure: ? Do not drive or use machinery. ? Do not sign important documents. ? Do not drink alcohol. ? Do your regular daily activities at a slower pace than normal. ? Eat soft, easy-to-digest foods. ? Rest often.  Take over-the-counter or prescription medicines only as told by your health care provider.  It is up to you to get the results of your procedure. Ask your health care provider, or the department performing the procedure, when your results will be ready. Relieving cramping and bloating  Try walking around when you have cramps or feel bloated.  Apply heat to your abdomen as told by your health care provider. Use a heat source that your health care provider recommends, such as a moist heat pack or a heating pad. ? Place a towel between your skin and the heat source. ? Leave the heat on for 20-30 minutes. ? Remove the heat if your skin turns bright red. This is especially important if you are unable to feel pain, heat, or cold. You may have a greater risk of getting burned. Eating and drinking  Drink enough fluid to keep your urine clear or pale yellow.  Resume your normal diet as instructed by your health care provider. Avoid heavy or fried foods that are hard to digest.  Avoid  drinking alcohol for as long as instructed by your health care provider. Contact a health care provider if:  You have blood in your stool 2-3 days after the procedure. Get help right away if:  You have more than a small spotting of blood in your stool.  You pass large blood clots in your stool.  Your abdomen is swollen.  You have nausea or vomiting.  You have a fever.  You have increasing abdominal pain that is not relieved with medicine. This information is not intended to replace advice given to you by your health care provider. Make sure you discuss any questions you have with your health care provider.   Colon Polyps Polyps are tissue growths inside the body. Polyps can grow in many places, including the large intestine (colon). A polyp may be a round bump or a mushroom-shaped growth. You could have one polyp or several. Most colon polyps are noncancerous (benign). However, some colon polyps can become cancerous over time. What are the causes? The exact cause of colon polyps is not known. What increases the risk? This condition is more likely to develop in people who:  Have a family history of colon cancer or colon polyps.  Are older than 55 or older than 45 if they are African American.  Have inflammatory bowel disease, such as ulcerative colitis or Crohn disease.  Are overweight.  Smoke cigarettes.  Do not get enough exercise.  Drink too much alcohol.  Eat a diet that is: ?  High in fat and red meat. ? Low in fiber.  Had childhood cancer that was treated with abdominal radiation.  What are the signs or symptoms? Most polyps do not cause symptoms. If you have symptoms, they may include:  Blood coming from your rectum when having a bowel movement.  Blood in your stool.The stool may look dark red or black.  A change in bowel habits, such as constipation or diarrhea.  How is this diagnosed? This condition is diagnosed with a colonoscopy. This is a procedure  that uses a lighted, flexible scope to look at the inside of your colon. How is this treated? Treatment for this condition involves removing any polyps that are found. Those polyps will then be tested for cancer. If cancer is found, your health care provider will talk to you about options for colon cancer treatment. Follow these instructions at home: Diet  Eat plenty of fiber, such as fruits, vegetables, and whole grains.  Eat foods that are high in calcium and vitamin D, such as milk, cheese, yogurt, eggs, liver, fish, and broccoli.  Limit foods high in fat, red meats, and processed meats, such as hot dogs, sausage, bacon, and lunch meats.  Maintain a healthy weight, or lose weight if recommended by your health care provider. General instructions  Do not smoke cigarettes.  Do not drink alcohol excessively.  Keep all follow-up visits as told by your health care provider. This is important. This includes keeping regularly scheduled colonoscopies. Talk to your health care provider about when you need a colonoscopy.  Exercise every day or as told by your health care provider. Contact a health care provider if:  You have new or worsening bleeding during a bowel movement.  You have new or increased blood in your stool.  You have a change in bowel habits.  You unexpectedly lose weight. This information is not intended to replace advice given to you by your health care provider. Make sure you discuss any questions you have with your health care provider.

## 2017-08-21 NOTE — Op Note (Signed)
[facility] Patient Name: Jordan Weaver Procedure Date: 08/21/2017 11:38 AM MRN: [id number] Date of Birth: Apr 28, 1955 Attending MD: Hildred Laser , MD CSN: [id number] Age: 62 Admit Type: Outpatient Procedure:                Colonoscopy Indications:              Screening for colorectal malignant neoplasm Providers:                Hildred Laser, MD, Otis Peak B. Sharon Seller, RN, Aram Candela Referring MD:             Delight Ovens, PA Medicines:                Meperidine 50 mg IV, Midazolam 5 mg IV Complications:            No immediate complications. Estimated Blood Loss:     Estimated blood loss was minimal. Procedure:                Pre-Anesthesia Assessment:                           - Prior to the procedure, a History and Physical                            was performed, and patient medications and                            allergies were reviewed. The patient's tolerance of                            previous anesthesia was also reviewed. The risks                            and benefits of the procedure and the sedation                            options and risks were discussed with the patient.                            All questions were answered, and informed consent                            was obtained. Prior Anticoagulants: The patient                            last took aspirin 3 days prior to the procedure.                            ASA Grade Assessment: II - A patient with mild                            systemic disease. After reviewing the risks and  benefits, the patient was deemed in satisfactory                            condition to undergo the procedure.                           After obtaining informed consent, the colonoscope                            was passed under direct vision. Throughout the                            procedure, the patient's blood pressure, pulse, and    oxygen saturations were monitored continuously. The                            EC-[id number] ([id number]) scope was introduced through                            the anus and advanced to the the cecum, identified                            by appendiceal orifice and ileocecal valve. The                            colonoscopy was technically difficult and complex                            due to a redundant colon. Successful completion of                            the procedure was aided by changing the patient to                            a supine position, using manual pressure and                            withdrawing and reinserting the scope. The patient                            tolerated the procedure well. The quality of the                            bowel preparation was excellent. The ileocecal                            valve, appendiceal orifice, and rectum were                            photographed. Scope In: 12:43:04 PM Scope Out: 1:18:57 PM Scope Withdrawal Time: 0 hours 18 minutes 57 seconds  Total Procedure Duration: 0 hours 35 minutes 53 seconds  Findings:      The perianal and digital rectal examinations were normal.      A  diffuse area of mild melanosis was found in the entire colon.      A small polyp was found in the hepatic flexure. The polyp was sessile.       Biopsies were taken with a cold forceps for histology.      The exam was otherwise normal throughout the examined colon.      External hemorrhoids were found during retroflexion. The hemorrhoids       were small.      Anal papilla was hypertrophied. Impression:               - Melanosis in the colon.                           - One small polyp at the hepatic flexure. Biopsied.                           - External hemorrhoids.                           - Anal papilla was hypertrophied. Moderate Sedation:      Moderate (conscious) sedation was administered by the endoscopy nurse       and supervised by  the endoscopist. The following parameters were       monitored: oxygen saturation, heart rate, blood pressure, CO2       capnography and response to care. Total physician intraservice time was       40 minutes. Recommendation:           - Patient has a contact number available for                            emergencies. The signs and symptoms of potential                            delayed complications were discussed with the                            patient. Return to normal activities tomorrow.                            Written discharge instructions were provided to the                            patient.                           - Resume previous diet today.                           - Continue present medications.                           - Resume aspirin at prior dose tomorrow.                           - Await pathology results.                           -  Repeat colonoscopy for surveillance based on                            pathology results. Procedure Code(s):        --- Professional ---                           [phone], Colonoscopy, flexible; with biopsy, single                            or multiple                           G0500, Moderate sedation services provided by the                            same physician or other qualified health care                            professional performing a gastrointestinal                            endoscopic service that sedation supports,                            requiring the presence of an independent trained                            observer to assist in the monitoring of the                            patient's level of consciousness and physiological                            status; initial 15 minutes of intra-service time;                            patient age 57 years or older (additional time may                            be reported with [phone], as appropriate)                           [phone], Moderate sedation  services provided by the                            same physician or other qualified health care                            professional performing the diagnostic or                            therapeutic service that the sedation supports,  requiring the presence of an independent trained                            observer to assist in the monitoring of the                            patient's level of consciousness and physiological                            status; each additional 15 minutes intraservice                            time (List separately in addition to code for                            primary service)                           [phone], Moderate sedation services provided by the                            same physician or other qualified health care                            professional performing the diagnostic or                            therapeutic service that the sedation supports,                            requiring the presence of an independent trained                            observer to assist in the monitoring of the                            patient's level of consciousness and physiological                            status; each additional 15 minutes intraservice                            time (List separately in addition to code for                            primary service) Diagnosis Code(s):        --- Professional ---                           Z12.11, Encounter for screening for malignant                            neoplasm of colon  K63.89, Other specified diseases of intestine                           D12.3, Benign neoplasm of transverse colon (hepatic                            flexure or splenic flexure)                           K62.89, Other specified diseases of anus and rectum                           K64.4, Residual hemorrhoidal skin tags CPT copyright 2017 [employer]  Association. All rights reserved. The codes documented in this report are preliminary and upon coder review may  be revised to meet current compliance requirements. Hildred Laser, MD Hildred Laser, MD 08/21/2017 1:30:31 PM This report has been signed electronically. Number of Addenda: 0

## 2017-08-23 ENCOUNTER — Encounter (HOSPITAL_COMMUNITY): Payer: Self-pay | Admitting: Internal Medicine

## 2017-09-09 ENCOUNTER — Other Ambulatory Visit: Payer: Self-pay | Admitting: *Deleted

## 2017-09-09 ENCOUNTER — Telehealth: Payer: Self-pay | Admitting: Obstetrics and Gynecology

## 2017-09-09 NOTE — Telephone Encounter (Signed)
Pt called requesting refill on testosterone cream. She states that she uses the 2% cream and needs it to be filled at [facility]. Advised pt that I would send her request to Dr Glo Herring and she could check with her pharmacy in the next 24 hours. Pt verbalized understanding.

## 2017-09-25 ENCOUNTER — Encounter: Payer: Self-pay | Admitting: Internal Medicine

## 2017-09-25 ENCOUNTER — Ambulatory Visit: Payer: BLUE CROSS/BLUE SHIELD | Admitting: Internal Medicine

## 2017-09-25 VITALS — BP 108/74 | HR 86 | Ht 66.0 in | Wt 157.2 lb

## 2017-09-25 DIAGNOSIS — J3802 Paralysis of vocal cords and larynx, bilateral: Secondary | ICD-10-CM | POA: Diagnosis not present

## 2017-09-25 DIAGNOSIS — J986 Disorders of diaphragm: Secondary | ICD-10-CM

## 2017-09-25 DIAGNOSIS — R0609 Other forms of dyspnea: Secondary | ICD-10-CM

## 2017-09-25 DIAGNOSIS — I5189 Other ill-defined heart diseases: Secondary | ICD-10-CM

## 2017-09-25 NOTE — Progress Notes (Signed)
Subjective:     Patient ID: Jordan Weaver, female   DOB: [birth year], 62 y.o.   MRN: [id number]  HPI  OV 01/22/2017  Chief Complaint  Patient presents with  . Advice Only    abnormal CXR right lung, CT scan collapsed lung. shoulder surgery july '18, mod dyspnea on exertoin   62 year old female new consult. She is here with her husband. 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 She tells me that [place] in [place] [facility] [place] she had left shoulder surgery and after that she started noticing new onset of shortness of breathfor activities much lighter than before. She tells me that she gets dyspneic now cleaning bathrooms all walking upstairs relieved by rest. This no associated cough or wheezing. She has a family history of asthma and she's been on ACE inhibitors for 20 years but she does not have any cough or wheezing. The onset of dyspnea is definitely related to the left shoulder surgery. This no orthopnea paroxysmal nocturnal dyspnea or cough or hemoptysis. This no fever or weight loss. She underwent evaluation by her primary care physician. She had a CT chest with contrast. Images are not available for my visualization. Date of test is 01/11/2017. The report shows that she has markedly elevation of right diaphragm associated with right middle lobe and right lower lobe atelectasis. In addition she has fatty livershe also has an aberrant right subclavian artery. She denies any chest pain or previous cardiac workup. There is no weight loss  Walking desaturation test done today on room air with a full head probe. Resting pulse ox 95%. Resting heart rate 92%. Final heart rate 104/m. Resting pulse ox 94%. Did not desaturate.   OV 02/14/2017  Chief Complaint  Patient presents with  . Follow-up    Pt states that she is the same as she was at last visit. States that she is still becoming SOB when doing  minor activities but has learned to slow down, coughing usually when laying down at night, and occ. CP.   Fu test results. Acute dyspnea following sholder surgery on left side.   1. 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 ECho  - stiff heart muscle that does not relax well: pressure (grade 2 diastolic dysfunction). - Pulmonary arteries: Systolic pressure was mildly increased. PA   peak pressure: 35 mm Hg (S)  3. D-dimer high but borderline: Duplex LE Ultrasound - negative for DVT.    4. FeNO 02/14/2017 -= 22pb and normal   OV 03/18/2017  Chief Complaint  Patient presents with  . Follow-up    Pt has begun rehab at [facility] and stated that since she has neuropathy she does not know how long she can do it.  Pt has had some tightness in chest x2 days and does have SOB when doing activities.  Denies any cough.     Follow-up dyspnea due to paralyzed right hemidiaphragm and also diastolic dysfunction  Since her last visit 5 weeks ago she's lost weight. She is also taking Lasix. These measures seemed to have helped her dyspnea little bit only. Nevertheless it is definitely a little bit better. She just started pulmonary rehabilitation 1 class and did well. 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 She status post back surgeries 20 years ago but has been dealing with bilateral feet neuropathy only in the last 1 year.  She's not interested in second opinion for dia   05/07/2017 Acute OV : Chest pain  Patient presents for an acute office visit.  Patient complains of 3 days of mid chest pain and tightness.  She says that she started having some tightness in the middle part of her chest that is been pretty constant over the last 3 days.  It is sore to touch.  She has no associated increased dyspnea, cough, hemoptysis or increased edema.  She is not taking any medications for relief.  She denies any radiating pains into her shoulder or back or neck.  Patient says she  does have a family history of heart disease in a sister and father. EKG today shows NSR , nonspecific changes .   Patient is followed for chronic dyspnea felt secondary to a paralyzed right   hemidiaphragm and diastolic heart failure.  She is currently involved in pulmonary rehab.  She says overall breathing shortness of breath are back at baseline.  She is not seeing significant improvement in her dyspnea since starting pulmonary rehab.  Does feel like that she is more conditioned.   phragmatic paralysis  OV 06/17/2017  Chief Complaint  Patient presents with  . Follow-up    Pt states she has been having a rough time. States she is more problems with SOB even doing little things such as changing sheets on bed, has occ. cough.   Follow-up paralyzed right diaphragm after surgery?  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  At this point in time she tells me that she is no better.  She has tried pulmonary rehabilitation [currently on hold because of needing clearance from Dr. Harl Bowie cardiology] she is on Lasix and she has lost some weight and done exercise but despite all this dyspnea persist.  Vacuuming doing household work or any kind of exertion like walking uphill with the dog makes her dyspneic.  She is applied for disability and it is that bad.  She is also reporting hoarseness of voice that she tells me has been present ever since the surgery associated with the frozen diaphragm.  She tells me that she spoke to the anesthesiologist who did not report any adverse events at the time of surgery or after the surgery.  The hoarseness of voice persists.  She has not seen ENT for this.  She is open to getting a second opinion.  I gave the CD-ROM of a CT scan of the chest back to her.  In February 2019 she had a chest x-ray that still shows elevated right hemidiaphragm.   OV 09/25/2017  Chief Complaint  Patient presents with  . Follow-up    Pt  states she has been doing well since last visit. States she has been able to adapt to SOB and has been doing exercises to help.    Follow-up dyspnea due to paralyzed right hemidiaphragm after shoulder surgery and also associated diastolic dysfunction  Last visit was in March 2019. At that point in time because of persistent symptoms I referred her to [facility] and also because of her new onset of hoarseness of voice that she revealed that started after the surgery and referred her to ENT. 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 She's did see DR Ronalee Red in pulmonary at [facility] in April 2019. I reviewed the notes and agree with his findings. His recommendation is to do neck MRI and EMG study. He does not think this is Parsonage Turner syndrome.  And I agree with him on this. Of note, she is upcoming appointment with Dr. Vertell Limber in neurosurgery. Dr. Vertell Limber has seen her for low back issues and operated on her many years ago. Most recently in January 2019 she did visit with him because of light up in the low back. She has upcoming appointment with him in 10/09/2017.  .   has a past medical history of Celiac disease, GERD (gastroesophageal reflux disease), and Hypertension.   reports that she has never smoked. She has never used smokeless tobacco.  Past Surgical History:  Procedure Laterality Date  . BREAST SURGERY     knot removed  . COLONOSCOPY N/A 08/21/2017   Procedure: COLONOSCOPY;  Surgeon: Rogene Houston, MD;  [facility];  Service: Endoscopy;  Laterality: N/A;  100  . DILATION AND CURETTAGE OF UTERUS     multpile  . POLYPECTOMY  08/21/2017   Procedure: POLYPECTOMY;  Surgeon: Rogene Houston, MD;  [facility];  Service: Endoscopy;;  colon   . ROTATOR CUFF REPAIR    . SALPINGOOPHORECTOMY Left 1975  . SPINE SURGERY      Allergies  Allergen Reactions  . Codeine Nausea And Vomiting  . Hydrocodone Nausea And Vomiting and Rash  . Tramadol Nausea  And Vomiting    Immunization History  Administered Date(s) Administered  . Influenza-Unspecified 01/31/2017    Family History  Problem Relation Age of Onset  . Asthma Mother   . Cancer Mother        breast  . Hypertension Mother   . Arthritis Mother   . Cancer Father        colon  . Kidney disease Father   . Cancer Sister        breast  . Hypertension Sister      Current Outpatient Medications:  .  aspirin 81 MG tablet, Take 1 tablet (81 mg total) by mouth daily., Disp: 30 tablet, Rfl:  .  benazepril (LOTENSIN) 10 MG tablet, 10 mg daily. , Disp: , Rfl: 3 .  betamethasone dipropionate (DIPROLENE) 0.05 % cream, Apply topically 2 (two) times daily., Disp: 30 g, Rfl: 4 .  DULoxetine (CYMBALTA) 30 MG capsule, Take 30 mg by mouth 2 (two) times daily. , Disp: , Rfl:  .  furosemide (LASIX) 40 MG tablet, Take 1 tablet (40 mg total) daily by mouth., Disp: 60 tablet, Rfl: 3 .  gabapentin (NEURONTIN) 100 MG capsule, Take 100 mg by mouth 2 (two) times daily. , Disp: , Rfl:  .  omeprazole (PRILOSEC) 20 MG capsule, Take 20 mg by mouth daily., Disp: , Rfl:  .  Potassium Chloride ER 20 MEQ TBCR, Take 20 mEq daily by mouth., Disp: 60 tablet, Rfl: 3 .  spironolactone (ALDACTONE) 25 MG tablet, Take 25 mg by mouth daily. , Disp: , Rfl:  .  TESTOSTERONE NA, Apply 1 application topically 3 (three) times a week. 2% cream Monday, Wednesday & Friday, Disp: , Rfl:     Review of Systems     Objective:   Physical Exam Vitals:   09/25/17 1117  BP: 108/74  Pulse: 86  SpO2: 96%  Weight: 157 lb 3.2 oz (71.3 kg)  Height: 5' 6"  (1.676 m)    Estimated body mass index is 25.37 kg/m as calculated from the following:   Height as of this encounter: 5' 6"  (1.676 m).   Weight as of this encounter: 157 lb 3.2 oz (71.3 kg).      Assessment:  ICD-10-CM   1. Dyspnea on exertion R06.09   2. Diastolic dysfunction M35.36   3. 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 Bilateral vocal cord paralysis J38.02         Plan:      Shortness of breath is because of all of the above reasons  For diastolic dysfunction:reduce Lasix to 20 mg once daily and potassium 10 mEq once daily and monitor  For vocal cord paralysis:I'm glad we found out about this. When he me Dr. Vertell Limber early July 2019 please request if he can do both c-spine MRI and also the [facility] advised by Dr. Ronalee Red at [facility]  For elevated diaphragm: Continue to adapt and do daily exercises  Overall: It appears he had neck injury of some sort at the time of surgery v ? Medication/anesthesia reaction  Follow-up - December 2019/January 2020 - we will decide about getting a chest x-ray or doing a sniff test at that time - Keep [facility] appointment in April 2020 with Dr. Ronalee Red    > 50% of this > 25 min visit spent in face to face counseling or coordination of care - by this undersigned MD - Dr Brand Males. This includes one or more of the following documented above: discussion of test results, diagnostic or treatment recommendations, prognosis, risks and benefits of management options, instructions, education, compliance or risk-factor reduction   Dr. Brand Males, M.D., [facility].C.P Pulmonary and Critical Care Medicine Staff Physician, [place] Director - Interstitial Lung Disease  Program  Pulmonary [place] at [place], [place], [address]  Pager: [phone], If no answer or between  15:00h - 7:00h: call [phone] Telephone: [phone]

## 2017-09-25 NOTE — Patient Instructions (Addendum)
ICD-10-CM   1. Dyspnea on exertion R06.09   2. Diastolic dysfunction I14.43   3. 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 Bilateral vocal cord paralysis J38.02     Shortness of breath is because of all of the above reasons  For diastolic dysfunction:reduce Lasix to 20 mg once daily and potassium 10 mEq once daily and monitor  For vocal cord paralysis:I'm glad we found out about this. When he me Dr. Vertell Limber early July 2019 please request if he can do both c-spine MRI and also the [facility] advised by Dr. Ronalee Red at [facility]  For elevated diaphragm: Continue to adapt and do daily exercises  Follow-up - December 2019/January 2020 - we will decide about getting a chest x-ray or doing a sniff test at that time - Keep [facility] appointment in April 2020 with Dr. Ronalee Red

## 2017-10-09 DIAGNOSIS — J3800 Paralysis of vocal cords and larynx, unspecified: Secondary | ICD-10-CM | POA: Insufficient documentation

## 2017-10-14 ENCOUNTER — Other Ambulatory Visit (HOSPITAL_COMMUNITY): Payer: Self-pay | Admitting: Neurosurgery

## 2017-10-15 ENCOUNTER — Other Ambulatory Visit (HOSPITAL_COMMUNITY): Payer: Self-pay | Admitting: Neurosurgery

## 2017-10-15 DIAGNOSIS — J986 Disorders of diaphragm: Secondary | ICD-10-CM

## 2017-10-15 DIAGNOSIS — M5412 Radiculopathy, cervical region: Secondary | ICD-10-CM

## 2017-10-22 ENCOUNTER — Ambulatory Visit (HOSPITAL_COMMUNITY)
Admission: RE | Admit: 2017-10-22 | Discharge: 2017-10-22 | Disposition: A | Discharge status: Home / Self Care | Payer: BLUE CROSS/BLUE SHIELD | Source: Ambulatory Visit | Attending: Neurosurgery | Admitting: Neurosurgery

## 2017-10-22 DIAGNOSIS — M4802 Spinal stenosis, cervical region: Secondary | ICD-10-CM | POA: Insufficient documentation

## 2017-10-22 DIAGNOSIS — M47812 Spondylosis without myelopathy or radiculopathy, cervical region: Secondary | ICD-10-CM | POA: Diagnosis not present

## 2017-10-22 DIAGNOSIS — M5412 Radiculopathy, cervical region: Secondary | ICD-10-CM | POA: Insufficient documentation

## 2017-10-22 DIAGNOSIS — J986 Disorders of diaphragm: Secondary | ICD-10-CM | POA: Diagnosis present

## 2017-11-25 ENCOUNTER — Encounter: Payer: Self-pay | Admitting: Cardiology

## 2017-11-25 ENCOUNTER — Ambulatory Visit (INDEPENDENT_AMBULATORY_CARE_PROVIDER_SITE_OTHER): Payer: BLUE CROSS/BLUE SHIELD | Admitting: Cardiology

## 2017-11-25 VITALS — BP 124/90 | HR 86 | Ht 67.0 in | Wt 155.6 lb

## 2017-11-25 DIAGNOSIS — R0789 Other chest pain: Secondary | ICD-10-CM | POA: Diagnosis not present

## 2017-11-25 DIAGNOSIS — I5032 Chronic diastolic (congestive) heart failure: Secondary | ICD-10-CM | POA: Diagnosis not present

## 2017-11-25 NOTE — Patient Instructions (Signed)
Medication Instructions:  Stop aspirin   Labwork: I will request labs from pcp  Testing/Procedures: none  Follow-Up: Your physician wants you to follow-up in: 6 months .  You will receive a reminder letter in the mail two months in advance. If you don't receive a letter, please call our office to schedule the follow-up appointment.   Any Other Special Instructions Will Be Listed Below (If Applicable).     If you need a refill on your cardiac medications before your next appointment, please call your pharmacy.

## 2017-11-25 NOTE — Progress Notes (Signed)
Clinical Summary Jordan Weaver is a 62 y.o.female seen today for follow up of the following medical problems.   1. Chest pain - midchest pressure/tightness. Up to 6-7/10 in severity. No other associated pain. Not positional - constant pain x 2 days without relief. Can be tender to palpation.  - treated with topical lidocaine patch by pcp with some improvement.   - still with pain at times. Often with burping, often occurs after taking her pills in the morning. Overall symptoms better from prior visit.   2. Chronic diastolic HF - 56/2563 echo: LVEF 60-65%, no WMAs, grade II diastoilc dysfunction.  - no recent edema, chronic stable SOB.    3. 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 Followed by pulmonary   SH: her sister is [name] also a patient of mine  Past Medical History:  Diagnosis Date  . Celiac disease   . GERD (gastroesophageal reflux disease)   . Hypertension      Allergies  Allergen Reactions  . Codeine Nausea And Vomiting  . Hydrocodone Nausea And Vomiting and Rash  . Tramadol Nausea And Vomiting     Current Outpatient Medications  Medication Sig Dispense Refill  . aspirin 81 MG tablet Take 1 tablet (81 mg total) by mouth daily. 30 tablet   . benazepril (LOTENSIN) 10 MG tablet 10 mg daily.   3  . betamethasone dipropionate (DIPROLENE) 0.05 % cream Apply topically 2 (two) times daily. 30 g 4  . DULoxetine (CYMBALTA) 30 MG capsule Take 30 mg by mouth 2 (two) times daily.     . furosemide (LASIX) 40 MG tablet Take 1 tablet (40 mg total) daily by mouth. 60 tablet 3  . gabapentin (NEURONTIN) 100 MG capsule Take 100 mg by mouth 2 (two) times daily.     [name] omeprazole (PRILOSEC) 20 MG capsule Take 20 mg by mouth daily.    . Potassium Chloride ER 20 MEQ TBCR Take 20 mEq daily by mouth. 60 tablet 3  . spironolactone (ALDACTONE) 25 MG tablet Take 25 mg by mouth daily.     . TESTOSTERONE NA Apply 1 application topically 3  (three) times a week. 2% cream Monday, Wednesday & Friday     No current facility-administered medications for this visit.      Past Surgical History:  Procedure Laterality Date  . BREAST SURGERY     knot removed  . COLONOSCOPY N/A 08/21/2017   Procedure: COLONOSCOPY;  Surgeon: Rogene Houston, MD;  [facility];  Service: Endoscopy;  Laterality: N/A;  100  . DILATION AND CURETTAGE OF UTERUS     multpile  . POLYPECTOMY  08/21/2017   Procedure: POLYPECTOMY;  Surgeon: Rogene Houston, MD;  [facility];  Service: Endoscopy;;  colon   . ROTATOR CUFF REPAIR    . SALPINGOOPHORECTOMY Left 1975  . SPINE SURGERY       Allergies  Allergen Reactions  . Codeine Nausea And Vomiting  . Hydrocodone Nausea And Vomiting and Rash  . Tramadol Nausea And Vomiting      Family History  Problem Relation Age of Onset  . Asthma Mother   . Cancer Mother        breast  . Hypertension Mother   . Arthritis Mother   . Cancer Father        colon  . Kidney disease Father   . Cancer Sister        breast  . Hypertension Sister  Social History Ms. Hoback reports that she has never smoked. She has never used smokeless tobacco. Ms. Librizzi reports that she does not drink alcohol.   Review of Systems CONSTITUTIONAL: No weight loss, fever, chills, weakness or fatigue.  HEENT: Eyes: No visual loss, blurred vision, double vision or yellow sclerae.No hearing loss, sneezing, congestion, runny nose or sore throat.  SKIN: No rash or itching.  CARDIOVASCULAR: per hpi RESPIRATORY: per hpi GASTROINTESTINAL: No anorexia, nausea, vomiting or diarrhea. No abdominal pain or blood.  GENITOURINARY: No burning on urination, no polyuria NEUROLOGICAL: No headache, dizziness, syncope, paralysis, ataxia, numbness or tingling in the extremities. No change in bowel or bladder control.  MUSCULOSKELETAL: No muscle, back pain, joint pain or stiffness.  LYMPHATICS: No enlarged nodes. No history  of splenectomy.  PSYCHIATRIC: No history of depression or anxiety.  ENDOCRINOLOGIC: No reports of sweating, cold or heat intolerance. No polyuria or polydipsia.  [name]   Physical Examination Vitals:   11/25/17 0934  BP: 124/90  Pulse: 86  SpO2: 96%   Vitals:   11/25/17 0934  Weight: 155 lb 9.6 oz (70.6 kg)  Height: 5' 7"  (1.702 m)    Gen: resting comfortably, no acute distress HEENT: no scleral icterus, pupils equal round and reactive, no palptable cervical adenopathy,  CV: RRR, no m/r/,g no jvd Resp: Clear to auscultation bilaterally GI: abdomen is soft, non-tender, non-distended, normal bowel sounds, no hepatosplenomegaly MSK: extremities are warm, no edema.  Skin: warm, no rash Neuro:  no focal deficits Psych: appropriate affect   Diagnostic Studies 12/2016 echo Study Conclusions  - Left ventricle: The cavity size was normal. Wall thickness was increased in a pattern of mild LVH. Systolic function was normal. The estimated ejection fraction was in the range of 60% to 65%. Wall motion was normal; there were no regional wall motion abnormalities. Features are consistent with a pseudonormal left ventricular filling pattern, with concomitant abnormal relaxation and increased filling pressure (grade 2 diastolic dysfunction). - Pulmonary arteries: Systolic pressure was mildly increased. PA peak pressure: 35 mm Hg (S).     Assessment and Plan  1. Atypical chest pain - chest pain is not consistent with cardiac etiology - continue to monitor at this time, would not pursue ishcemic testin.    2. Chronic diastolic HF - echo with grade II diastolic dysfunction.  - appears euvolemic on exam, continue current diuretic.     She will stop aspirin, no indication for continued use due to recent guidline changes   F/u 6 months      Arnoldo Lenis, M.D.

## 2017-12-12 ENCOUNTER — Other Ambulatory Visit: Payer: Self-pay | Admitting: Obstetrics and Gynecology

## 2017-12-12 DIAGNOSIS — Z1231 Encounter for screening mammogram for malignant neoplasm of breast: Secondary | ICD-10-CM

## 2017-12-26 ENCOUNTER — Ambulatory Visit: Payer: BLUE CROSS/BLUE SHIELD | Admitting: Women's Health

## 2017-12-26 ENCOUNTER — Encounter: Payer: Self-pay | Admitting: Women's Health

## 2017-12-26 VITALS — BP 150/79 | HR 122 | Ht 66.0 in | Wt 156.5 lb

## 2017-12-26 DIAGNOSIS — N750 Cyst of Bartholin's gland: Secondary | ICD-10-CM | POA: Diagnosis not present

## 2017-12-26 NOTE — Progress Notes (Signed)
   GYN VISIT Patient name: Jordan Weaver MRN [id number]  Date of birth: 30-Mar-1956 Chief Complaint:   nodule on side of labia  History of Present Illness:   Jordan Weaver is a 62 y.o. G24P0010 Caucasian female being seen today for report of nodule Lt labia x 81month about size of almond, in panty line so is slightly irritating, hasn't changed size, not really tender. 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    No LMP recorded. Patient is postmenopausal. The current method of family planning is post menopausal status. Last pap none listed in Epic, pt was thinking she had one here in last year or two. Results were:  unsure Review of Systems:   Pertinent items are noted in HPI Denies fever/chills, dizziness, headaches, visual disturbances, fatigue, shortness of breath, chest pain, abdominal pain, vomiting, abnormal vaginal discharge/itching/odor/irritation, problems with periods, bowel movements, urination, or intercourse unless otherwise stated above.  Pertinent History Reviewed:  Reviewed past medical,surgical, social, obstetrical and family history.  Reviewed problem list, medications and allergies. Physical Assessment:   Vitals:   12/26/17 1342  BP: (!) 150/79  Pulse: (!) 122  Weight: 156 lb 8 oz (71 kg)  Height: 5' 6"  (1.676 m)  Body mass index is 25.26 kg/m.       Physical Examination:   General appearance: alert, well appearing, and in no distress  Mental status: alert, oriented to person, place, and time  Skin: warm & dry   Cardiovascular: normal heart rate noted  Respiratory: normal respiratory effort, no distress  Abdomen: soft, non-tender   Pelvic: VULVA: atrophy, ~2cm mobile deep nodule Lt lower labia, slightly tender, co-exam w/ JVF feels it is a small Bartholin's cyst  Extremities: no edema   No results found for this or any previous visit (from the past 24 hour(s)).  Assessment & Plan:  1) Small Lt Bartholin's cyst> not large enough to do anything  with at this time, pt to continue to monitor, if enlarges/becomes painful let uKoreaknow  2) Needs pap> schedule  Meds: No orders of the defined types were placed in this encounter.   No orders of the defined types were placed in this encounter.   Return for needs , Pap & physical.  KRoma SchanzCNM, WDart[facility]9/26/2019 2:05 PM

## 2017-12-29 ENCOUNTER — Other Ambulatory Visit: Payer: Self-pay | Admitting: Obstetrics and Gynecology

## 2018-01-01 ENCOUNTER — Other Ambulatory Visit: Payer: Self-pay | Admitting: Obstetrics and Gynecology

## 2018-01-01 MED ORDER — BETAMETHASONE DIPROPIONATE 0.05 % EX CREA: TOPICAL_CREAM | Freq: Two times a day (BID) | CUTANEOUS | 4 refills | Status: DC

## 2018-01-01 NOTE — Progress Notes (Signed)
Betamethasone  Refilled for vulvar dystophy,  Patient Is doing very well alternating testosterone and betamethasone.

## 2018-01-01 NOTE — Telephone Encounter (Signed)
refil betamethasone for vulvar dystophy

## 2018-01-20 ENCOUNTER — Ambulatory Visit (HOSPITAL_COMMUNITY)
Admission: RE | Admit: 2018-01-20 | Discharge: 2018-01-20 | Disposition: A | Discharge status: Home / Self Care | Payer: BLUE CROSS/BLUE SHIELD | Source: Ambulatory Visit | Attending: Obstetrics and Gynecology | Admitting: Obstetrics and Gynecology

## 2018-01-20 ENCOUNTER — Telehealth: Payer: Self-pay | Admitting: *Deleted

## 2018-01-20 DIAGNOSIS — Z1231 Encounter for screening mammogram for malignant neoplasm of breast: Secondary | ICD-10-CM | POA: Insufficient documentation

## 2018-01-20 NOTE — Telephone Encounter (Signed)
Patient informed mammogram was normal.  Verbalized understanding.

## 2018-01-27 ENCOUNTER — Other Ambulatory Visit (HOSPITAL_COMMUNITY)
Admission: RE | Admit: 2018-01-27 | Discharge: 2018-01-27 | Disposition: A | Discharge status: Home / Self Care | Payer: BLUE CROSS/BLUE SHIELD | Source: Ambulatory Visit | Attending: Obstetrics & Gynecology | Admitting: Obstetrics & Gynecology

## 2018-01-27 ENCOUNTER — Ambulatory Visit: Payer: BLUE CROSS/BLUE SHIELD | Admitting: Women's Health

## 2018-01-27 ENCOUNTER — Encounter: Payer: Self-pay | Admitting: Women's Health

## 2018-01-27 VITALS — BP 120/70 | HR 88 | Ht 66.0 in | Wt 158.0 lb

## 2018-01-27 DIAGNOSIS — Z01419 Encounter for gynecological examination (general) (routine) without abnormal findings: Secondary | ICD-10-CM

## 2018-01-27 DIAGNOSIS — N904 Leukoplakia of vulva: Secondary | ICD-10-CM | POA: Diagnosis not present

## 2018-01-27 NOTE — Progress Notes (Signed)
   WELL-WOMAN EXAMINATION Patient name: Jordan Weaver MRN [id number]  Date of birth: 07/21/1955 Chief Complaint:   Gynecologic Exam  History of Present Illness:   Jordan Weaver is a 62 y.o. G43P0010 Caucasian female being seen today for a routine well-woman exam.  Current complaints: none. Regimen of betamethasone and testosterone previously rx'd by JVF for severe vulvar dystrophy works well  PCP: Delight Ovens, [facility]      does not desire labs No LMP recorded. Patient is postmenopausal. The current method of family planning is post menopausal status Last pap >23yr ago. Results were: normal Last mammogram: 01/20/18. Results were: normal Last colonoscopy: 08/21/17. Results were: polyp tubular adenoma, next TCS in 79yr Review of Systems:   Pertinent items are noted in HPI Denies any headaches, blurred vision, fatigue, shortness of breath, chest pain, abdominal pain, abnormal vaginal discharge/itching/odor/irritation, problems with periods, bowel movements, urination, or intercourse unless otherwise stated above. Pertinent History Reviewed:  Reviewed past medical,surgical, social and family history.  Reviewed problem list, medications and allergies. Physical Assessment:   Vitals:   01/27/18 1035  BP: 120/70  Pulse: 88  Weight: 158 lb (71.7 kg)  Height: 5' 6"  (1.676 m)  Body mass index is 25.5 kg/m.        Physical Examination:   General appearance - well appearing, and in no distress  Mental status - alert, oriented to person, place, and time  Psych:  She has a normal mood and affect  Skin - warm and dry, normal color, no suspicious lesions noted  Chest - effort normal, all lung fields clear to auscultation bilaterally  Heart - normal rate and regular rhythm  Neck:  midline trachea, no thyromegaly or nodules  Breasts - breasts appear normal, no suspicious masses, no skin or nipple changes or  axillary nodes  Abdomen - soft, nontender, nondistended, no masses or  organomegaly  Pelvic - VULVA: dystrophic VAGINA: unable to adequately open speculum to visualize d/t dystrophy of and around itroitus  CERVIX: unable to visualize d/t dystrophy, blind pap obtained  Thin prep pap is done w/ HR HPV cotesting  UTERUS: difficult to examine, but nontender   ADNEXA: difficult to examine, but no tenderness noted.  Extremities:  No swelling or varicosities noted  No results found for this or any previous visit (from the past 24 hour(s)).  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   Follow-up: Return in about 1 year (around 01/28/2019) for Physical.  KimbFinleyvilleNP-BC 01/27/2018 1:13 PM

## 2018-01-30 LAB — CYTOLOGY - PAP
DIAGNOSIS: NEGATIVE
HPV (WINDOPATH): NOT DETECTED

## 2018-03-06 ENCOUNTER — Ambulatory Visit (INDEPENDENT_AMBULATORY_CARE_PROVIDER_SITE_OTHER): Payer: BLUE CROSS/BLUE SHIELD | Admitting: Internal Medicine

## 2018-03-06 ENCOUNTER — Encounter: Payer: Self-pay | Admitting: Internal Medicine

## 2018-03-06 VITALS — BP 118/80 | HR 81 | Ht 66.0 in | Wt 157.0 lb

## 2018-03-06 DIAGNOSIS — J3802 Paralysis of vocal cords and larynx, bilateral: Secondary | ICD-10-CM | POA: Diagnosis not present

## 2018-03-06 DIAGNOSIS — J986 Disorders of diaphragm: Secondary | ICD-10-CM

## 2018-03-06 NOTE — Progress Notes (Signed)
HPI  OV 01/22/2017  Chief Complaint  Patient presents with  . Advice Only    abnormal CXR right lung, CT scan collapsed lung. shoulder surgery july '18, mod dyspnea on exertoin   62 year old female new consult. She is here with her husband. 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 She tells me that sometime in [place] in [place] [facility] [place] she had left shoulder surgery and after that she started noticing new onset of shortness of breathfor activities much lighter than before. She tells me that she gets dyspneic now cleaning bathrooms all walking upstairs relieved by rest. This no associated cough or wheezing. She has a family history of asthma and she's been on ACE inhibitors for 20 years but she does not have any cough or wheezing. The onset of dyspnea is definitely related to the left shoulder surgery. This no orthopnea paroxysmal nocturnal dyspnea or cough or hemoptysis. This no fever or weight loss. She underwent evaluation by her primary care physician. She had a CT chest with contrast. Images are not available for my visualization. Date of test is 01/11/2017. The report shows that she has markedly elevation of right diaphragm associated with right middle lobe and right lower lobe atelectasis. In addition she has fatty livershe also has an aberrant right subclavian artery. She denies any chest pain or previous cardiac workup. There is no weight loss  Walking desaturation test done today on room air with a full head probe. Resting pulse ox 95%. Resting heart rate 92%. Final heart rate 104/m. Resting pulse ox 94%. Did not desaturate.   OV 02/14/2017  Chief Complaint  Patient presents with  . Follow-up    Pt states that she is the same as she was at last visit. States that she is still becoming SOB when doing minor activities but has learned to slow down, coughing usually when laying down at night,  and occ. CP.   Fu test results. Acute dyspnea following sholder surgery on left side.   1. 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 ECho  - stiff heart muscle that does not relax well: pressure (grade 2 diastolic dysfunction). - Pulmonary arteries: Systolic pressure was mildly increased. PA   peak pressure: 35 mm Hg (S)  3. D-dimer high but borderline: Duplex LE Ultrasound - negative for DVT.    4. FeNO 02/14/2017 -= 22pb and normal   OV 03/18/2017  Chief Complaint  Patient presents with  . Follow-up    Pt has begun rehab at [facility] and stated that since she has neuropathy she does not know how long she can do it.  Pt has had some tightness in chest x2 days and does have SOB when doing activities.  Denies any cough.     Follow-up dyspnea due to paralyzed right hemidiaphragm and also diastolic dysfunction  Since her last visit 5 weeks ago she's lost weight. She is also taking Lasix. These measures seemed to have helped her dyspnea little bit only. Nevertheless it is definitely a little bit better. She just started pulmonary rehabilitation 1 class and did well. 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 She status post back surgeries 20 years ago but has been dealing with bilateral feet neuropathy only in the last 1 year. She's not interested in second opinion for dia   05/07/2017 Acute OV : Chest pain  Patient presents  for an acute office visit.  Patient complains of 3 days of mid chest pain and tightness.  She says that she started having some tightness in the middle part of her chest that is been pretty constant over the last 3 days.  It is sore to touch.  She has no associated increased dyspnea, cough, hemoptysis or increased edema.  She is not taking any medications for relief.  She denies any radiating pains into her shoulder or back or neck.  Patient says she does have a family history of heart disease in a sister and father. EKG today shows NSR ,  nonspecific changes .   Patient is followed for chronic dyspnea felt secondary to a paralyzed right   hemidiaphragm and diastolic heart failure.  She is currently involved in pulmonary rehab.  She says overall breathing shortness of breath are back at baseline.  She is not seeing significant improvement in her dyspnea since starting pulmonary rehab.  Does feel like that she is more conditioned.   phragmatic paralysis  OV 06/17/2017  Chief Complaint  Patient presents with  . Follow-up    Pt states she has been having a rough time. States she is more problems with SOB even doing little things such as changing sheets on bed, has occ. cough.   Follow-up paralyzed right diaphragm after surgery?  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  At this point in time she tells me that she is no better.  She has tried pulmonary rehabilitation [currently on hold because of needing clearance from Dr. Harl Bowie cardiology] she is on Lasix and she has lost some weight and done exercise but despite all this dyspnea persist.  Vacuuming doing household work or any kind of exertion like walking uphill with the dog makes her dyspneic.  She is applied for disability and it is that bad.  She is also reporting hoarseness of voice that she tells me has been present ever since the surgery associated with the frozen diaphragm.  She tells me that she spoke to the anesthesiologist who did not report any adverse events at the time of surgery or after the surgery.  The hoarseness of voice persists.  She has not seen ENT for this.  She is open to getting a second opinion.  I gave the CD-ROM of a CT scan of the chest back to her.  In February 2019 she had a chest x-ray that still shows elevated right hemidiaphragm.   OV 09/25/2017  Chief Complaint  Patient presents with  . Follow-up    Pt states she has been doing well since last visit. States she has been able to adapt to SOB and  has been doing exercises to help.    Follow-up dyspnea due to paralyzed right hemidiaphragm after shoulder surgery and also associated diastolic dysfunction  Last visit was in March 2019. At that point in time because of persistent symptoms I referred her to [facility] and also because of her new onset of hoarseness of voice that she revealed that started after the surgery and referred her to ENT. 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 She's did see DR Ronalee Red in pulmonary at [facility] in April 2019. I reviewed the notes and agree with his findings. His recommendation is to do neck MRI and EMG study. He does not think this is Parsonage Turner syndrome. And I agree with him on this. Of note, she is upcoming appointment with Dr. Vertell Limber in neurosurgery. Dr.  Vertell Limber has seen her for low back issues and operated on her many years ago. Most recently in January 2019 she did visit with him because of light up in the low back. She has upcoming appointment with him in 10/09/2017.   . OV 03/06/2018  Subjective:  Patient ID: Jordan Weaver, female , DOB: 1955/09/02 , age 49 y.o. , MRN: [id number] , ADDRESS: [address] [place] [id number]   03/06/2018 -   Chief Complaint  Patient presents with  . Follow-up    Dyspnea on exertion, still getting winded with exertion but after stopping she recovers well    Follow-up dyspnea due to paralyzed right hemidiaphragm (dyspnea soon after)  and vocal cord paralysis (a month or so)  after shoulder surgery in July 20198 and also associated diastolic dysfunction   HPI VERONCIA JEZEK 61 y.o. -follow-up for the above.  In the interim no new issues.  She decided to come for follow-up a little bit earlier than scheduled because her husband is having foot surgery.  She says she continues to have dyspnea issues with climbing stairs and also hoarseness of voice because of vocal cord paralysis.  There are no new weakness issues.  She still puzzled by the onset of her  symptoms.  She says she is spoken to orthopedic and the anesthesiologist and so far no etiology found.  She says she takes things slowly.  She is up-to-date with the flu shot.  There is no orthopnea proximal nocturnal dyspnea wheezing or edema.  We discussed today and she has never seen a neurologist.  She is open to seeing one.     ROS - per HPI     has a past medical history of Celiac disease, GERD (gastroesophageal reflux disease), and Hypertension.   reports that she has never smoked. She has never used smokeless tobacco.  Past Surgical History:  Procedure Laterality Date  . BREAST SURGERY     knot removed  . COLONOSCOPY N/A 08/21/2017   Procedure: COLONOSCOPY;  Surgeon: Rogene Houston, MD;  Location: [facility];  Service: Endoscopy;  Laterality: N/A;  100  . DILATION AND CURETTAGE OF UTERUS     multpile  . POLYPECTOMY  08/21/2017   Procedure: POLYPECTOMY;  Surgeon: Rogene Houston, MD;  Location: [facility];  Service: Endoscopy;;  colon   . ROTATOR CUFF REPAIR    . SALPINGOOPHORECTOMY Left 1975  . SPINE SURGERY      Allergies  Allergen Reactions  . Codeine Nausea And Vomiting  . Hydrocodone Nausea And Vomiting and Rash  . Tramadol Nausea And Vomiting    Immunization History  Administered Date(s) Administered  . Influenza-Unspecified 01/31/2017    Family History  Problem Relation Age of Onset  . Asthma Mother   . Cancer Mother        breast  . Hypertension Mother   . Arthritis Mother   . Cancer Father        colon  . Kidney disease Father   . Cancer Sister        breast  . Hypertension Sister      Current Outpatient Medications:  .  benazepril (LOTENSIN) 10 MG tablet, 10 mg daily. , Disp: , Rfl: 3 .  betamethasone dipropionate (DIPROLENE) 0.05 % cream, APPLY  CREAM TOPICALLY TWICE DAILY, Disp: 30 g, Rfl: 4 .  betamethasone dipropionate (DIPROLENE) 0.05 % cream, Apply topically 2 (two) times daily., Disp: 30 g, Rfl: 4 .  DULoxetine (CYMBALTA) 30  MG capsule, Take 30 mg by mouth 2 (two) times daily. , Disp: , Rfl:  .  furosemide (LASIX) 40 MG tablet, Take 1 tablet (40 mg total) daily by mouth., Disp: 60 tablet, Rfl: 3 .  gabapentin (NEURONTIN) 100 MG capsule, Take 100 mg by mouth 2 (two) times daily. , Disp: , Rfl:  .  omeprazole (PRILOSEC) 20 MG capsule, Take 20 mg by mouth daily., Disp: , Rfl:  .  Potassium Chloride ER 20 MEQ TBCR, Take 20 mEq daily by mouth., Disp: 60 tablet, Rfl: 3 .  spironolactone (ALDACTONE) 25 MG tablet, Take 25 mg by mouth daily. , Disp: , Rfl:  .  TESTOSTERONE NA, Apply 1 application topically 3 (three) times a week. 2% cream Monday, Wednesday & Friday, Disp: , Rfl:       Objective:   Vitals:   03/06/18 1410  BP: 118/80  Pulse: 81  SpO2: 97%  Weight: 157 lb (71.2 kg)  Height: 5' 6"  (1.676 m)    Estimated body mass index is 25.34 kg/m as calculated from the following:   Height as of this encounter: 5' 6"  (1.676 m).   Weight as of this encounter: 157 lb (71.2 kg).  @WEIGHTCHANGE @  [employer]   03/06/18 1410  Weight: 157 lb (71.2 kg)     Physical Exam  General Appearance:    Alert, cooperative, no distress, appears stated age - yes , Deconditioned looking - no , OBESE  - no, Sitting on Wheelchair -  no  Head:    Normocephalic, without obvious abnormality, atraumatic  Eyes:    PERRL, conjunctiva/corneas clear,  Ears:    Normal TM's and external ear canals, both ears  Nose:   Nares normal, septum midline, mucosa normal, no drainage    or sinus tenderness. OXYGEN ON  - no . Patient is @ ra   Throat:   Lips, mucosa, and tongue normal; teeth and gums normal. Cyanosis on lips - no  Neck:   Supple, symmetrical, trachea midline, no adenopathy;    thyroid:  no enlargement/tenderness/nodules; no carotid   bruit or JVD  Back:     Symmetric, no curvature, ROM normal, no CVA tenderness  Lungs:     Distress - no , Wheeze no, Barrell Chest - no, Purse lip breathing - no, Crackles - no HOARSE VOICE +     Chest Wall:    No tenderness or deformity.    Heart:    Regular rate and rhythm, S1 and S2 normal, no rub   or gallop, Murmur - no  Breast Exam:    NOT DONE  Abdomen:     Soft, non-tender, bowel sounds active all four quadrants,    no masses, no organomegaly. Visceral obesity - no  Genitalia:   NOT DONE  Rectal:   NOT DONE  Extremities:   Extremities - normal, Has Cane - no, Clubbing - no, Edema - no  Pulses:   2+ and symmetric all extremities  Skin:   Stigmata of Connective Tissue Disease - no  Lymph nodes:   Cervical, supraclavicular, and axillary nodes normal  Psychiatric:  Neurologic:   Pleasant - no, Anxious - no, Flat affect - mildyes  CAm-ICU - neg, Alert and Oriented x 3 - yes, Moves all 4s - yes, Speech - normal, Cognition - intact           Assessment:       ICD-10-CM   1. Bilateral vocal cord paralysis J38.02 Ambulatory referral to Neurology  2. 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 This includes one or more of the following documented above: discussion of test results, diagnostic or treatment recommendations, prognosis, risks and benefits of management options, instructions, education, compliance or risk-factor reduction)   SIGNATURE    Dr. Brand Males, M.D., F.C.C.P,  Pulmonary and Critical Care Medicine Staff Physician, [place] Director - Interstitial Lung Disease  Program  Pulmonary [place] [facility] [place], [place], [address]  Pager: [phone], If no answer or between  15:00h - 7:00h: call [phone] Telephone: [phone]  2:36 PM 03/06/2018

## 2018-03-06 NOTE — Patient Instructions (Addendum)
Bilateral vocal cord paralysis Right diaphragm paralysis  - refer [facility] - Dr Benjamine Sprague  Dyspnea  - due to above, stable  Followup 6 months or sooner if needed

## 2018-03-07 ENCOUNTER — Encounter: Payer: Self-pay | Admitting: Neurology

## 2018-05-17 NOTE — Progress Notes (Signed)
[facility] Clinic Note - Initial Visit   Date: 05/19/18  Jordan Weaver MRN: [id number] DOB: November 13, 1955   Dear Dr. Chase Caller:  Thank you for your kind referral of Jordan Weaver for consultation of diaphragmatic paralysis and vocal cord paralysis. Although her history is well known to you, please allow [name] to reiterate it for the purpose of our medical record. The patient was accompanied to the clinic by sister who also provides collateral information.     History of Present Illness: Jordan Weaver is a 63 y.o. right-handed Caucasian female with celiac disease, hypertension, right hemidiaphragm paralysis, bilateral vocal cord paralysis, lumbar fusion presenting for evaluation of diaphragmatic paralysis and vocal cord paralysis.    In July 2018, she underwent left shoulder surgery regional nerve block, after which she began experiencing exertional shortness of breath and raspy voice.   To evaluate her dyspnea, she had CT chest contrast in October 2018 which showed marked elevation of the right diaphragm associated with right middle lobe and right lower lobe atelectasis.  She was referred to Dr. 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  She was also evaluated by ENT in May 2019 who saw her vocal cords were not moving.  She went to voice therapy at [facility], which helps.  She was also evaluated at [facility] pulmonology in April 2019 who recommended a neck and cervical spine as well as diaphragmatic EMG.  Imaging did not disclose reason for her weakness.  She has mild cervical spinal stenosis without nerve impingement and no mass in the neck region.   When symptoms started, she does not recall severe pain, weakness or numbness/tingling of the right arm.  Functionally, she is limited by her shortness of breath and stopped working  in July 2018.  She has to take breaks to do her chores, and stopped working.  She sleeps on one pillow.  She does not have difficulty with swallowing, double vision, or focal arm or leg weakness.    She takes gabapentin 114m three times daily since 2017 for neuropathy, diagnosed by her podiatry. She describes that her toes have numbness.  She does not have diabetes and denies alcohol use.   She is also followed by Dr. HMaryjean Kafor lumbar ESI.    Out-side paper records, electronic medical record, and images have been reviewed where available and summarized as:  MRI cervical spine without contrast 10/22/2017: 1. 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 2. Moderate right C3-4 facet arthrosis causes mild narrowing of the right neural foramen.  MRI neck 10/22/2017:  Normal MRI appearance of the neck. No imaging finding of vocal cord paralysis. No abnormality along the courses of the recurrent laryngeal nerves.  Past Medical History:  Diagnosis Date  . Celiac disease   . GERD (gastroesophageal reflux disease)   . Hemidiaphragm paralysis   . Hypertension   . Vocal cord paralysis     Past Surgical History:  Procedure Laterality Date  . BREAST SURGERY     knot removed  . COLONOSCOPY N/A 08/21/2017   Procedure: COLONOSCOPY;  Surgeon: RRogene Houston MD;  [facility];  Service: Endoscopy;  Laterality: N/A;  100  . DILATION AND CURETTAGE OF UTERUS     multpile  . POLYPECTOMY  08/21/2017   Procedure: POLYPECTOMY;  Surgeon: RRogene Houston MD;  Location: [facility];  Service:  Endoscopy;;  colon   . ROTATOR CUFF REPAIR    . SALPINGOOPHORECTOMY Left 1975  . SPINE SURGERY       Medications:  Outpatient Encounter Medications as of 05/19/2018  Medication Sig  . benazepril (LOTENSIN) 10 MG tablet 10 mg daily.   . betamethasone dipropionate (DIPROLENE) 0.05 % cream APPLY  CREAM TOPICALLY TWICE DAILY  . DULoxetine (CYMBALTA) 30 MG capsule Take 30 mg by mouth 2  (two) times daily.   . furosemide (LASIX) 40 MG tablet Take 1 tablet (40 mg total) daily by mouth.  . gabapentin (NEURONTIN) 100 MG capsule Take 100 mg by mouth 3 (three) times daily.   [name] omeprazole (PRILOSEC) 20 MG capsule Take 20 mg by mouth daily.  . Potassium Chloride ER 20 MEQ TBCR Take 20 mEq daily by mouth.  . spironolactone (ALDACTONE) 25 MG tablet Take 25 mg by mouth daily.   . TESTOSTERONE NA Apply 1 application topically 3 (three) times a week. 2% cream Monday, Wednesday & Friday  . [DISCONTINUED] betamethasone dipropionate (DIPROLENE) 0.05 % cream Apply topically 2 (two) times daily.   No facility-administered encounter medications on file as of 05/19/2018.      Allergies:  Allergies  Allergen Reactions  . Codeine Nausea And Vomiting  . Hydrocodone Nausea And Vomiting and Rash  . Tramadol Nausea And Vomiting    Family History: Family History  Problem Relation Age of Onset  . Asthma Mother   . Cancer Mother        breast  . Hypertension Mother   . Arthritis Mother   . Cancer Father        colon  . Kidney disease Father   . Cancer Sister        breast  . Hypertension Sister   . Parkinson's disease Paternal Grandmother   . Stroke Paternal Grandmother     Social History: Social History   Tobacco Use  . Smoking status: Never Smoker  . Smokeless tobacco: Never Used  Substance Use Topics  . Alcohol use: No  . Drug use: No   Social History   Social History Narrative   She stopped working in July 2018, previously worked as a Retail buyer at [employer].    Highest level of education:  BS   Lives with husband in a 2 story home.   No children.     Review of Systems:  CONSTITUTIONAL: No fevers, chills, night sweats, or weight loss.   EYES: No visual changes or eye pain ENT: No hearing changes.  No history of nose bleeds.   RESPIRATORY: No cough, wheezing +shortness of breath.   CARDIOVASCULAR: Negative for chest pain, and palpitations.   GI: Negative for  abdominal discomfort, blood in stools or black stools.  No recent change in bowel habits.   GU:  No history of incontinence.   MUSCLOSKELETAL: No history of joint pain or swelling.  No myalgias.   SKIN: Negative for lesions, rash, and itching.   HEMATOLOGY/ONCOLOGY: Negative for prolonged bleeding, bruising easily, and swollen nodes.  No history of cancer.   ENDOCRINE: Negative for cold or heat intolerance, polydipsia or goiter.   PSYCH:  No depression or anxiety symptoms.   NEURO: As Above.   Vital Signs:  BP 110/80   Pulse 90   Ht 5' 6"  (1.676 m)   Wt 159 lb 8 oz (72.3 kg)   SpO2 99%   BMI 25.74 kg/m    General Medical Exam:   General:  Well appearing, comfortable.  Eyes/ENT: see cranial nerve examination.   Neck: No masses appreciated.  Full range of motion without tenderness.  No carotid bruits. Respiratory:  Clear to auscultation, good air entry bilaterally.   Cardiac:  Regular rate and rhythm, no murmur.   Extremities:  No deformities, edema, or skin discoloration.  Skin:  No rashes or lesions.  Neurological Exam: MENTAL STATUS including orientation to time, place, person, recent and remote memory, attention span and concentration, language, and fund of knowledge is normal.  Speech is not dysarthric, very mild raspy quality.  Lingual sounds are normal, mild difficulty with gutteral sounds.  CRANIAL NERVES: II:  No visual field defects.  Unremarkable fundi.   III-IV-VI: Pupils equal round and reactive to light.  Normal conjugate, extra-ocular eye movements in all directions of gaze.  No nystagmus.  No ptosis.  V:  Normal facial sensation.     VII:  Normal facial symmetry and movements.  No pathologic facial reflexes.  VIII:  Normal hearing and vestibular function.   IX-X:  Normal palatal movement.   XI:  Normal shoulder shrug and head rotation.   XII:  Normal tongue strength and range of motion, no deviation or fasciculation.  MOTOR:  No atrophy, fasciculations or  abnormal movements.  No pronator drift.  Tone is normal.    Right Upper Extremity:    Left Upper Extremity:    Deltoid  5/5   Deltoid  5/5   Biceps  5/5   Biceps  5/5   Triceps  5/5   Triceps  5/5   Wrist extensors  5/5   Wrist extensors  5/5   Wrist flexors  5/5   Wrist flexors  5/5   Finger extensors  5/5   Finger extensors  5/5   Finger flexors  5/5   Finger flexors  5/5   Dorsal interossei  5/5   Dorsal interossei  5/5   Abductor pollicis  5/5   Abductor pollicis  5/5   Tone (Ashworth scale)  0  Tone (Ashworth scale)  0   Right Lower Extremity:    Left Lower Extremity:    Hip flexors  5/5   Hip flexors  5/5   Hip extensors  5/5   Hip extensors  5/5   Knee flexors  5/5   Knee flexors  5/5   Knee extensors  5/5   Knee extensors  5/5   Dorsiflexors  5/5   Dorsiflexors  5/5   Plantarflexors  5/5   Plantarflexors  5/5   Toe extensors  5/5   Toe extensors  5/5   Toe flexors  5/5   Toe flexors  5/5   Tone (Ashworth scale)  0  Tone (Ashworth scale)  0   MSRs:  Right                                                                 Left brachioradialis 2+  brachioradialis 2+  biceps 2+  biceps 2+  triceps 2+  triceps 2+  patellar 2+  patellar 2+  ankle jerk 2+  ankle jerk 2+  Hoffman no  Hoffman no  plantar response down  plantar response down   SENSORY:  Reduced pin prick and temperature distal to lower legs and reduced vibration in the  feet.  Sensation is intact in the arms.  Rhomberg test positive.  COORDINATION/GAIT: Normal finger-to- nose-finger.  Intact rapid alternating movements bilaterally.  Gait narrow based and stable. Stressed gait intact.  She is able to perform tandem gait.   IMPRESSION: 1.  Right hemidiaphragm paralysis and bilateral vocal cord paralysis followed left shoulder surgery in July 2018.  P[place]-Turner Syndrome seems less likely given the involvement of bilateral recurrent laryngeal nerves, which is a branch off the vagus nerve and is not involved in a  brachial plexopathy.  A more widespread neuropathy, such as polyradiculoneuropathy remains a possibility given the patchy distribution of symptoms and preceding surgery.  Alternatively, with her distal neuropathy affecting the feet, hereditary neuropathy involving the phrenic and recurrent laryngeal nerves, such as GDAP1-associated neuropathy needs to be considered, although very rare 2.  Neuropathy affecting the feet, most likely due to celiac disease.    PLAN/RECOMMENDATIONS:  Check CK, vitamin B12, SPEP with IFE, copper, TSH, HBA1c, folate NCS/EMG of the right arm and leg Consider genetic testing panel for hereditary neuropathy  Further recommendations pending results.    Thank you for allowing me to participate in patient's care.  If I can answer any additional questions, I would be pleased to do so.    Sincerely,     K. Posey Pronto, DO

## 2018-05-19 ENCOUNTER — Other Ambulatory Visit (INDEPENDENT_AMBULATORY_CARE_PROVIDER_SITE_OTHER): Payer: BLUE CROSS/BLUE SHIELD

## 2018-05-19 ENCOUNTER — Encounter: Payer: Self-pay | Admitting: Neurology

## 2018-05-19 ENCOUNTER — Ambulatory Visit: Payer: BLUE CROSS/BLUE SHIELD | Admitting: Neurology

## 2018-05-19 VITALS — BP 110/80 | HR 90 | Ht 66.0 in | Wt 159.5 lb

## 2018-05-19 DIAGNOSIS — G63 Polyneuropathy in diseases classified elsewhere: Secondary | ICD-10-CM

## 2018-05-19 DIAGNOSIS — J986 Disorders of diaphragm: Secondary | ICD-10-CM | POA: Diagnosis not present

## 2018-05-19 DIAGNOSIS — J3800 Paralysis of vocal cords and larynx, unspecified: Secondary | ICD-10-CM

## 2018-05-19 DIAGNOSIS — K900 Celiac disease: Secondary | ICD-10-CM | POA: Diagnosis not present

## 2018-05-19 DIAGNOSIS — Z131 Encounter for screening for diabetes mellitus: Secondary | ICD-10-CM

## 2018-05-19 LAB — CK: Total CK: 100 U/L (ref 7–177)

## 2018-05-19 LAB — HEMOGLOBIN A1C: HEMOGLOBIN A1C: 6.2 % (ref 4.6–6.5)

## 2018-05-19 NOTE — Patient Instructions (Addendum)
Check labs  NCS/EMG of the right arm and leg   ELECTROMYOGRAM AND NERVE CONDUCTION STUDIES (EMG/NCS) INSTRUCTIONS  How to Prepare The neurologist conducting the EMG will need to know if you have certain medical conditions. Tell the neurologist and other EMG lab personnel if you: . Have a pacemaker or any other electrical medical device . Take blood-thinning medications . Have hemophilia, a blood-clotting disorder that causes prolonged bleeding Bathing Take a shower or bath shortly before your exam in order to remove oils from your skin. Don't apply lotions or creams before the exam.  What to Expect You'll likely be asked to change into a hospital gown for the procedure and lie down on an examination table. The following explanations can help you understand what will happen during the exam.  . Electrodes. The neurologist or a technician places surface electrodes at various locations on your skin depending on where you're experiencing symptoms. Or the neurologist may insert needle electrodes at different sites depending on your symptoms.  . Sensations. The electrodes will at times transmit a tiny electrical current that you may feel as a twinge or spasm. The needle electrode may cause discomfort or pain that usually ends shortly after the needle is removed. If you are concerned about discomfort or pain, you may want to talk to the neurologist about taking a short break during the exam.  . Instructions. During the needle EMG, the neurologist will assess whether there is any spontaneous electrical activity when the muscle is at rest - activity that isn't present in healthy muscle tissue - and the degree of activity when you slightly contract the muscle.  He or she will give you instructions on resting and contracting a muscle at appropriate times. Depending on what muscles and nerves the neurologist is examining, he or she may ask you to change positions during the exam.  After your EMG You may  experience some temporary, minor bruising where the needle electrode was inserted into your muscle. This bruising should fade within several days. If it persists, contact your primary care doctor.

## 2018-05-20 LAB — VITAMIN B12: VITAMIN B 12: 149 pg/mL — AB (ref 211–911)

## 2018-05-20 LAB — TSH: TSH: 0.96 u[IU]/mL (ref 0.35–4.50)

## 2018-05-22 LAB — IMMUNOFIXATION ELECTROPHORESIS
IgG (Immunoglobin G), Serum: 696 mg/dL (ref 600–1540)
IgM, Serum: 275 mg/dL (ref 50–300)
Immunoglobulin A: 143 mg/dL (ref 70–320)

## 2018-05-22 LAB — PROTEIN ELECTROPHORESIS, SERUM
ALPHA 2: 0.6 g/dL (ref 0.5–0.9)
Albumin ELP: 4.6 g/dL (ref 3.8–4.8)
Alpha 1: 0.3 g/dL (ref 0.2–0.3)
Beta 2: 0.3 g/dL (ref 0.2–0.5)
Beta Globulin: 0.5 g/dL (ref 0.4–0.6)
GAMMA GLOBULIN: 0.8 g/dL (ref 0.8–1.7)
Total Protein: 7.1 g/dL (ref 6.1–8.1)

## 2018-05-22 LAB — COPPER, SERUM: COPPER: 81 ug/dL (ref 70–175)

## 2018-05-26 ENCOUNTER — Telehealth: Payer: Self-pay | Admitting: *Deleted

## 2018-05-26 NOTE — Telephone Encounter (Signed)
I spoke with patient and gave her the results and instructions.  She would like to have the injections at her doctor's office in [place].  I will call to set this up for her.

## 2018-05-26 NOTE — Telephone Encounter (Signed)
-----   Message from Alda Berthold, DO sent at 05/23/2018  4:07 PM EST ----- Please inform patient that her vitamin B12 level is very low.  Start Vitamin B12 1017mg IM injection daily x 7 days, weekly x 4 weeks, then monthly thereafter x 1 year. Remaining labs are normal. Thanks.

## 2018-05-27 ENCOUNTER — Other Ambulatory Visit: Payer: Self-pay | Admitting: *Deleted

## 2018-05-27 MED ORDER — CYANOCOBALAMIN 1000 MCG/ML IJ SOLN: INTRAMUSCULAR | 0 refills | Status: DC

## 2018-05-29 ENCOUNTER — Ambulatory Visit: Payer: BLUE CROSS/BLUE SHIELD | Admitting: Cardiology

## 2018-05-29 ENCOUNTER — Encounter: Payer: Self-pay | Admitting: Cardiology

## 2018-05-29 VITALS — BP 116/74 | HR 78 | Ht 66.0 in | Wt 158.0 lb

## 2018-05-29 DIAGNOSIS — R0789 Other chest pain: Secondary | ICD-10-CM

## 2018-05-29 DIAGNOSIS — I5032 Chronic diastolic (congestive) heart failure: Secondary | ICD-10-CM

## 2018-05-29 NOTE — Patient Instructions (Signed)
Medication Instructions:  Your physician recommends that you continue on your current medications as directed. Please refer to the Current Medication list given to you today.  If you need a refill on your cardiac medications before your next appointment, please call your pharmacy.   Lab work: NONE  If you have labs (blood work) drawn today and your tests are completely normal, you will receive your results only by: [name] MyChart Message (if you have MyChart) OR . A paper copy in the mail If you have any lab test that is abnormal or we need to change your treatment, we will call you to review the results.  Testing/Procedures: NONE   Follow-Up: At [facility], you and your health needs are our priority.  As part of our continuing mission to provide you with exceptional heart care, we have created designated Provider Care Teams.  These Care Teams include your primary Cardiologist (physician) and Advanced Practice Providers (APPs -  Physician Assistants and Nurse Practitioners) who all work together to provide you with the care you need, when you need it. You will need a follow up appointment in 1 years.  Please call our office 2 months in advance to schedule this appointment.  You may see Carlyle Dolly, MD or one of the following Advanced Practice Providers on your designated Care Team:   Bernerd Pho, PA-C [facility]) . Ermalinda Barrios, PA-C ([place])  Any Other Special Instructions Will Be Listed Below (If Applicable). Thank you for choosing [facility]!

## 2018-05-29 NOTE — Progress Notes (Signed)
Clinical Summary Jordan Weaver is a 63 y.o.female seen today for follow up of the following medical problems.   1. Chest pain - midchest pressure/tightness. Up to 6-7/10 in severity. No other associated pain. Not positional - constant pain x 2 dayswithout relief. Can be tender to palpation. - treated with topical lidocainepatch by pcp with some improvement.  - still with pain at times. Often with burping, often occurs after taking her pills in the morning. Overall symptoms better from prior visit.   - chronic atypical symptoms unchanged since last visit.   2.Chronic diastolic HF - 22/6333 echo: LVEF 60-65%, no WMAs, grade II diastoilc dysfunction. - no recent edema, chronic stable SOB.   - no recent edema. Weights are stable.   3. 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 Followed by pulmonary   SH: her sister is [name] also a patient of mine   Past Medical History:  Diagnosis Date  . Celiac disease   . GERD (gastroesophageal reflux disease)   . Hemidiaphragm paralysis   . Hypertension   . Vocal cord paralysis      Allergies  Allergen Reactions  . Codeine Nausea And Vomiting  . Hydrocodone Nausea And Vomiting and Rash  . Tramadol Nausea And Vomiting     Current Outpatient Medications  Medication Sig Dispense Refill  . benazepril (LOTENSIN) 10 MG tablet 10 mg daily.   3  . betamethasone dipropionate (DIPROLENE) 0.05 % cream APPLY  CREAM TOPICALLY TWICE DAILY 30 g 4  . cyanocobalamin (,VITAMIN B-12,) 1000 MCG/ML injection Inject 1 ml IM daily x 7 days then 1 ml IM weekly x 4 weeks then 1 ml IM monthly x 1 year. 30 mL 0  . DULoxetine (CYMBALTA) 30 MG capsule Take 30 mg by mouth 2 (two) times daily.     . furosemide (LASIX) 40 MG tablet Take 1 tablet (40 mg total) daily by mouth. 60 tablet 3  . gabapentin (NEURONTIN) 100 MG capsule Take 100 mg by mouth 3 (three) times daily.     [name] omeprazole (PRILOSEC) 20 MG capsule  Take 20 mg by mouth daily.    . Potassium Chloride ER 20 MEQ TBCR Take 20 mEq daily by mouth. 60 tablet 3  . spironolactone (ALDACTONE) 25 MG tablet Take 25 mg by mouth daily.     . TESTOSTERONE NA Apply 1 application topically 3 (three) times a week. 2% cream Monday, Wednesday & Friday     No current facility-administered medications for this visit.      Past Surgical History:  Procedure Laterality Date  . BREAST SURGERY     knot removed  . COLONOSCOPY N/A 08/21/2017   Procedure: COLONOSCOPY;  Surgeon: Rogene Houston, MD;  [facility];  Service: Endoscopy;  Laterality: N/A;  100  . DILATION AND CURETTAGE OF UTERUS     multpile  . POLYPECTOMY  08/21/2017   Procedure: POLYPECTOMY;  Surgeon: Rogene Houston, MD;  [facility];  Service: Endoscopy;;  colon   . ROTATOR CUFF REPAIR    . SALPINGOOPHORECTOMY Left 1975  . SPINE SURGERY       Allergies  Allergen Reactions  . Codeine Nausea And Vomiting  . Hydrocodone Nausea And Vomiting and Rash  . Tramadol Nausea And Vomiting      Family History  Problem Relation Age of Onset  . Asthma Mother   . Cancer Mother        breast  . Hypertension Mother   .  Arthritis Mother   . Cancer Father        colon  . Kidney disease Father   . Cancer Sister        breast  . Hypertension Sister   . Parkinson's disease Paternal Grandmother   . Stroke Paternal Grandmother      Social History Jordan Weaver reports that she has never smoked. She has never used smokeless tobacco. Ms. Marulanda reports no history of alcohol use.   Review of Systems CONSTITUTIONAL: No weight loss, fever, chills, weakness or fatigue.  HEENT: Eyes: No visual loss, blurred vision, double vision or yellow sclerae.No hearing loss, sneezing, congestion, runny nose or sore throat.  SKIN: No rash or itching.  CARDIOVASCULAR: per hpi RESPIRATORY: No shortness of breath, cough or sputum.  GASTROINTESTINAL: No anorexia, nausea, vomiting or  diarrhea. No abdominal pain or blood.  GENITOURINARY: No burning on urination, no polyuria NEUROLOGICAL: No headache, dizziness, syncope, paralysis, ataxia, numbness or tingling in the extremities. No change in bowel or bladder control.  MUSCULOSKELETAL: No muscle, back pain, joint pain or stiffness.  LYMPHATICS: No enlarged nodes. No history of splenectomy.  PSYCHIATRIC: No history of depression or anxiety.  ENDOCRINOLOGIC: No reports of sweating, cold or heat intolerance. No polyuria or polydipsia.  [name]   Physical Examination Vitals:   05/29/18 0959  BP: 116/74  Pulse: 78  SpO2: 98%   Vitals:   05/29/18 0959  Weight: 158 lb (71.7 kg)  Height: 5' 6"  (1.676 m)    Gen: resting comfortably, no acute distress HEENT: no scleral icterus, pupils equal round and reactive, no palptable cervical adenopathy,  CV: RRR, no mr/g, no jvd Resp: Clear to auscultation bilaterally GI: abdomen is soft, non-tender, non-distended, normal bowel sounds, no hepatosplenomegaly MSK: extremities are warm, no edema.  Skin: warm, no rash Neuro:  no focal deficits Psych: appropriate affect   Diagnostic Studies 12/2016 echo Study Conclusions  - Left ventricle: The cavity size was normal. Wall thickness was increased in a pattern of mild LVH. Systolic function was normal. The estimated ejection fraction was in the range of 60% to 65%. Wall motion was normal; there were no regional wall motion abnormalities. Features are consistent with a pseudonormal left ventricular filling pattern, with concomitant abnormal relaxation and increased filling pressure (grade 2 diastolic dysfunction). - Pulmonary arteries: Systolic pressure was mildly increased. PA peak pressure: 35 mm Hg (S).     Assessment and Plan  1. Atypical chest pain - chronic atypical symptoms, continue to monitor at this time. No indication for stress testing - EKG today shows NSR, no ischemic changes   2. Chronic  diastolic HF - echo with grade II diastolic dysfunction.  - she remains euvolemic and asymptomatic, continue lasix  F/u 1 year.       Arnoldo Lenis, M.D.

## 2018-06-02 NOTE — Telephone Encounter (Signed)
Note sent to nurse. 

## 2018-06-03 ENCOUNTER — Encounter: Payer: Self-pay | Admitting: Neurology

## 2018-06-03 ENCOUNTER — Ambulatory Visit (INDEPENDENT_AMBULATORY_CARE_PROVIDER_SITE_OTHER): Payer: BLUE CROSS/BLUE SHIELD | Admitting: Neurology

## 2018-06-03 VITALS — BP 110/80 | HR 75 | Ht 66.0 in | Wt 158.0 lb

## 2018-06-03 DIAGNOSIS — J986 Disorders of diaphragm: Secondary | ICD-10-CM | POA: Diagnosis not present

## 2018-06-03 DIAGNOSIS — K900 Celiac disease: Secondary | ICD-10-CM

## 2018-06-03 DIAGNOSIS — J3800 Paralysis of vocal cords and larynx, unspecified: Secondary | ICD-10-CM | POA: Diagnosis not present

## 2018-06-03 DIAGNOSIS — M5417 Radiculopathy, lumbosacral region: Secondary | ICD-10-CM | POA: Diagnosis not present

## 2018-06-03 NOTE — Procedures (Signed)
[facility]  [place], [place]  [place], [place] [address] Tel: [phone] Fax:  [phone] Test Date:  06/03/2018  Patient: Jordan Weaver DOB: 1955/05/30 Physician: Narda Amber, DO  Sex: Female Height: 5' 6"  Ref Phys: Narda Amber, DO  ID#: [id number] Temp: 34.0C Technician:    Patient Complaints: This is a 63 year-old female with celiac disease, bilateral vocal cord paralysis, and right hemidiaphragm weakness referred to evaluate for brachial plexopathy and/or neuropathy.   NCV & EMG Findings: Extensive electrodiagnostic testing of the right upper and lower extremity shows: 1. All sensory responses including the right median, ulnar, radial, medial and lateral antebrachial cutaneous, sural and superficial peroneal nerves are within normal limits. 2. Right median, ulnar, and tibial motor responses are within normal limits.  Right peroneal motor response is absent at the extensor digitorum brevis, and normal at the tibialis anterior. 3. Right tibial H-reflex is normal. 4. There is no evidence of active or chronic motor axon loss changes affecting any of the tested muscles in the right upper extremity. 5. Chronic motor axon loss changes are seen in the right L5 myotome, without accompanied active denervation.  Impression: 1. Chronic L5 radiculopathy affecting the right lower extremity, mild. 2. There is no evidence of a brachial plexopathy or large fiber neuropathy affecting the right side.   ___________________________ Narda Amber, DO    Nerve Conduction Studies Anti Sensory Summary Table   Site NR Peak (ms) Norm Peak (ms) P-T Amp (V) Norm P-T Amp  Right Lat Ante Brach Cutan Anti Sensory (Lat Forearm)  34C  Lat Biceps    2.3 <2.8 12.0 >10  Right Med Ante Brach Cutan Anti Sensory (Med Forearm)  34C  Elbow    2.4  13.4   Right Median Anti Sensory (2nd Digit)  34C  Wrist    3.3 <3.8 22.7 >10  Right Radial Anti Sensory (Base 1st Digit)  34C  Wrist     2.1 <2.8 32.8 >10  Right Sup Peroneal Anti Sensory (Ant Lat Mall)  34C  12 cm    2.9 <4.6 7.1 >3  Right Sural Anti Sensory (Lat Mall)  34C  Calf    3.6 <4.6 7.2 >3  Right Ulnar Anti Sensory (5th Digit)  34C  Wrist    2.4 <3.2 16.4 >5   Motor Summary Table   Site NR Onset (ms) Norm Onset (ms) O-P Amp (mV) Norm O-P Amp Site1 Site2 Delta-0 (ms) Dist (cm) Vel (m/s) Norm Vel (m/s)  Right Median Motor (Abd Poll Brev)  34C  Wrist    3.0 <4.0 7.0 >5 Elbow Wrist 5.1 29.0 57 >50  Elbow    8.1  6.2         Right Peroneal Motor (Ext Dig Brev)  34C  Ankle NR  <6.0  >2.5 B Fib Ankle  0.0  >40  B Fib NR     Poplt B Fib  0.0  >40  Poplt NR            Right Peroneal TA Motor (Tib Ant)  34C  Fib Head    3.4 <4.5 6.2 >3 Poplit Fib Head 1.1 7.0 64 >40  Poplit    4.5  6.1         Right Tibial Motor (Abd Hall Brev)  34C  Ankle    4.3 <6.0 5.5 >4 Knee Ankle 10.6 42.0 40 >40  Knee    14.9  3.1         Right  Ulnar Motor (Abd Dig Minimi)  34C  Wrist    2.0 <3.1 7.8 >7 B Elbow Wrist 3.7 22.0 59 >50  B Elbow    5.7  7.3  A Elbow B Elbow 2.0 10.0 50 >50  A Elbow    7.7  7.1          H Reflex Studies   NR H-Lat (ms) Lat Norm (ms) L-R H-Lat (ms)  Right Tibial (Gastroc)  34C     32.52 <35    EMG   Side Muscle Ins Act Fibs Psw Fasc Number Recrt Dur Dur. Amp Amp. Poly Poly. Comment  Right 1stDorInt Nml Nml Nml Nml Nml Nml Nml Nml Nml Nml Nml Nml N/A  Right FlexPolLong Nml Nml Nml Nml Nml Nml Nml Nml Nml Nml Nml Nml N/A  Right Ext Indicis Nml Nml Nml Nml Nml Nml Nml Nml Nml Nml Nml Nml N/A  Right Biceps Nml Nml Nml Nml Nml Nml Nml Nml Nml Nml Nml Nml N/A  Right PronatorTeres Nml Nml Nml Nml Nml Nml Nml Nml Nml Nml Nml Nml N/A  Right Triceps Nml Nml Nml Nml Nml Nml Nml Nml Nml Nml Nml Nml N/A  Right Deltoid Nml Nml Nml Nml Nml Nml Nml Nml Nml Nml Nml Nml N/A  Right Infraspinatus Nml Nml Nml Nml Nml Nml Nml Nml Nml Nml Nml Nml N/A  Right Cervical Parasp Low Nml Nml Nml Nml Nml Nml Nml Nml Nml Nml  Nml Nml N/A  Right AntTibialis Nml Nml Nml Nml 1- Rapid Some 1+ Some 1+ Nml Nml N/A  Right Gastroc Nml Nml Nml Nml Nml Nml Nml Nml Nml Nml Nml Nml N/A  Right Flex Dig Long Nml Nml Nml Nml 1- Rapid Some 1+ Some 1+ Nml Nml N/A  Right RectFemoris Nml Nml Nml Nml Nml Nml Nml Nml Nml Nml Nml Nml N/A  Right GluteusMed Nml Nml Nml Nml 1- Rapid Some 1+ Some 1+ Nml Nml N/A      Waveforms:

## 2018-06-03 NOTE — Progress Notes (Signed)
Follow-up Visit   Date: 06/03/18   Jordan Weaver MRN: [id number] DOB: 1955/08/11   Interim History: Jordan Weaver is a 63 y.o. right-handed Caucasian female with celiac disease, hypertension, right hemidiaphragm paralysis, bilateral vocal cord paralysis, lumbar fusion returning to the clinic for follow-up of diaphragmatic paralysis and vocal cord paralysis.  The patient was accompanied to the clinic by sister who also provides collateral information.    History of present illness: In July 2018, she underwent left shoulder surgery regional nerve block, after which she began experiencing exertional shortness of breath and raspy voice.   To evaluate her dyspnea, she had CT chest contrast in October 2018 which showed marked elevation of the right diaphragm associated with right middle lobe and right lower lobe atelectasis.  She was referred to Dr. 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  She was also evaluated by ENT in May 2019 who saw her vocal cords were not moving.  She went to voice therapy at [facility], which helps.  She was also evaluated at [facility] pulmonology in April 2019 who recommended a neck and cervical spine as well as diaphragmatic EMG.  Imaging did not disclose reason for her weakness.  She has mild cervical spinal stenosis without nerve impingement and no mass in the neck region.   When symptoms started, she does not recall severe pain, weakness or numbness/tingling of the right arm.  Functionally, she is limited by her shortness of breath and stopped working in July 2018.  She has to take breaks to do her chores, and stopped working.  She sleeps on one pillow.  She does not have difficulty with swallowing, double vision, or focal arm or leg weakness.    She takes gabapentin 165m three times daily since 2017 for neuropathy,  diagnosed by her podiatry. She describes that her toes have numbness.  She does not have diabetes and denies alcohol use.   She is also followed by Dr. HMaryjean Kafor lumbar ESI.    UPDATE 06/03/2018:  She is here for electrodiagnostic testing and review labs.  Labs indicated vitamin B12 deficiency and she has started injections which she is self-administering.  No new complaints.   Medications:  Current Outpatient Medications on File Prior to Visit  Medication Sig Dispense Refill  . benazepril (LOTENSIN) 10 MG tablet 10 mg daily.   3  . betamethasone dipropionate (DIPROLENE) 0.05 % cream APPLY  CREAM TOPICALLY TWICE DAILY 30 g 4  . cyanocobalamin (,VITAMIN B-12,) 1000 MCG/ML injection Inject 1 ml IM daily x 7 days then 1 ml IM weekly x 4 weeks then 1 ml IM monthly x 1 year. 30 mL 0  . DULoxetine (CYMBALTA) 30 MG capsule Take 30 mg by mouth 2 (two) times daily.     . furosemide (LASIX) 40 MG tablet Take 1 tablet (40 mg total) daily by mouth. 60 tablet 3  . gabapentin (NEURONTIN) 100 MG capsule Take 100 mg by mouth 3 (three) times daily.     .Marland Kitchenomeprazole (PRILOSEC) 20 MG capsule Take 20 mg by mouth daily.    . Potassium Chloride ER 20 MEQ TBCR Take 20 mEq daily by mouth. 60 tablet 3  . potassium chloride SA (K-DUR,KLOR-CON) 20 MEQ tablet Take 20 mEq by mouth daily.    .Marland Kitchenspironolactone (ALDACTONE) 25 MG tablet Take 25 mg by mouth daily.     .[name]  TESTOSTERONE NA Apply 1 application topically 3 (three) times a week. 2% cream Monday, Wednesday & Friday    . triamcinolone (KENALOG) 0.1 % paste Place onto teeth.     No current facility-administered medications on file prior to visit.     Allergies:  Allergies  Allergen Reactions  . Codeine Nausea And Vomiting  . Hydrocodone Nausea And Vomiting and Rash  . Tramadol Nausea And Vomiting    Review of Systems:  CONSTITUTIONAL: No fevers, chills, night sweats, or weight loss.  EYES: No visual changes or eye pain ENT: No hearing changes.  No history of  nose bleeds.   RESPIRATORY: No cough, wheezing +shortness of breath.   CARDIOVASCULAR: Negative for chest pain, and palpitations.   GI: Negative for abdominal discomfort, blood in stools or black stools.  No recent change in bowel habits.   GU:  No history of incontinence.   MUSCLOSKELETAL: No history of joint pain or swelling.  No myalgias.   SKIN: Negative for lesions, rash, and itching.   ENDOCRINE: Negative for cold or heat intolerance, polydipsia or goiter.   PSYCH:  No depression or anxiety symptoms.   NEURO: As Above.   Vital Signs:  BP 110/80   Pulse 75   Ht 5' 6"  (1.676 m)   Wt 158 lb (71.7 kg)   SpO2 97%   BMI 25.50 kg/m    Neurological Exam: MENTAL STATUS including orientation to time, place, person, recent and remote memory, attention span and concentration, language, and fund of knowledge is normal.  Speech is not dysarthric, mild raspy quality.  CRANIAL NERVES: Pupils equal round and reactive to light.  Normal conjugate, extra-ocular eye movements in all directions of gaze.  No ptosis.  Face is symmetric.   MOTOR:  Motor strength is 5/5 in all extremities.  No atrophy, fasciculations or abnormal movements.   COORDINATION/GAIT:   Gait narrow based and stable.   Data: MRI cervical spine without contrast 10/22/2017: 1. 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 2. Moderate right C3-4 facet arthrosis causes mild narrowing of the right neural foramen.  MRI neck 10/22/2017:  Normal MRI appearance of the neck. No imaging finding of vocal cord paralysis. No abnormality along the courses of the recurrent laryngeal nerves.  Labs 05/19/2018:  CK 100, vitamin B12 149*, TSH 0.96, HbA1c 6.2, SPEP with IFE no M protein   IMPRESSION/PLAN: 1.  Right hemidiaphragm paralysis and bilateral vocal cord paralysis following left shoulder surgery in July 2018.  Etiology remains unclear.  There is no evidence of polyradiculoneuropathy or brachial plexopathy ([place]) on testing today.  Although very unlikely, I offered patient the option for genetic testing for hereditary neuropathies which can have variable presentation. She is interested in proceeding with genetic testing, this will be done through [employer] program which would cover the cost of testing.    2.  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  No evidence of a large fiber neuropathy on NCS.  Skin biopsy can be performed, if symptoms get worse.  Greater than 50% of this 25 minute visit was spent in counseling, explanation of diagnosis, planning of further management, and coordination of care.   Thank you for allowing me to participate in patient's care.  If I can answer any additional questions, I would be pleased to do so.    Sincerely,    Donika K. Posey Pronto, DO

## 2018-06-11 DIAGNOSIS — M47816 Spondylosis without myelopathy or radiculopathy, lumbar region: Secondary | ICD-10-CM | POA: Insufficient documentation

## 2018-07-14 ENCOUNTER — Telehealth: Payer: Self-pay | Admitting: Neurology

## 2018-07-14 ENCOUNTER — Encounter: Payer: Self-pay | Admitting: *Deleted

## 2018-07-14 NOTE — Telephone Encounter (Signed)
Results sent via My Chart.  

## 2018-07-14 NOTE — Telephone Encounter (Signed)
[facility] Comprehensive Neuropathy Panel 4/0/2020:  Negative.

## 2018-08-10 ENCOUNTER — Other Ambulatory Visit: Payer: Self-pay | Admitting: Obstetrics and Gynecology

## 2018-09-01 ENCOUNTER — Telehealth: Payer: Self-pay | Admitting: Internal Medicine

## 2018-09-01 NOTE — Telephone Encounter (Signed)
Called patient back and she wanted to know if she should still keep the follow up appointment with Dr. Ronalee Red at [facility]? Was referred to him back in March by Dr. Chase Caller.  Patient wanted to see Dr. Chase Caller first before keeping the appointment for follow up on 09/17/18. I told the patient that Dr. Chase Caller was still working in the hospital with Covid patients, but her request would be forwarded. Patient agreed to wait for his response.  Message forwarded to Dr. Chase Caller for review and advise.  Dr. Chase Caller, can you please take a look at the request above from the patient and advise if she should keep that appointment on 09/17/18 with [facility]? OR, does she need to be seen here in our clinic instead? Please advise. Thank you.

## 2018-09-02 NOTE — Telephone Encounter (Signed)
Pt has been made aware to f/u with Dr. Ronalee Red next. Nothing further.

## 2018-09-02 NOTE — Telephone Encounter (Signed)
Best to see Dr Ronalee Red next instead of me. Let her know that my phased reentry to office starts mid June 2020 and through July 2020 will be doing 10-20% and all only ILD cases

## 2018-09-02 NOTE — Telephone Encounter (Signed)
Pt is calling back [phone]

## 2018-09-02 NOTE — Telephone Encounter (Signed)
LMTCB x 1 

## 2018-11-13 ENCOUNTER — Telehealth: Payer: Self-pay

## 2018-11-13 MED ORDER — CYANOCOBALAMIN 1000 MCG/ML IJ SOLN: INTRAMUSCULAR | 6 refills | Status: DC

## 2018-11-13 NOTE — Telephone Encounter (Signed)
Received refill request for B12 injection.  Pt started on injection in Feb 2020.  6 month refill sent.

## 2018-12-22 ENCOUNTER — Other Ambulatory Visit (HOSPITAL_COMMUNITY): Payer: Self-pay | Admitting: Obstetrics and Gynecology

## 2018-12-22 DIAGNOSIS — Z1231 Encounter for screening mammogram for malignant neoplasm of breast: Secondary | ICD-10-CM

## 2019-01-23 ENCOUNTER — Ambulatory Visit (HOSPITAL_COMMUNITY)
Admission: RE | Admit: 2019-01-23 | Discharge: 2019-01-23 | Disposition: A | Discharge status: Home / Self Care | Payer: BC Managed Care – PPO | Source: Ambulatory Visit | Attending: Obstetrics and Gynecology | Admitting: Obstetrics and Gynecology

## 2019-01-23 ENCOUNTER — Ambulatory Visit (HOSPITAL_COMMUNITY): Payer: BLUE CROSS/BLUE SHIELD

## 2019-01-23 ENCOUNTER — Other Ambulatory Visit: Payer: Self-pay

## 2019-01-23 DIAGNOSIS — Z1231 Encounter for screening mammogram for malignant neoplasm of breast: Secondary | ICD-10-CM | POA: Diagnosis present

## 2019-01-30 ENCOUNTER — Other Ambulatory Visit: Payer: BLUE CROSS/BLUE SHIELD | Admitting: Adult Health

## 2019-02-21 ENCOUNTER — Other Ambulatory Visit: Payer: Self-pay | Admitting: Obstetrics and Gynecology

## 2019-03-18 ENCOUNTER — Other Ambulatory Visit: Payer: Self-pay

## 2019-03-18 ENCOUNTER — Encounter: Payer: Self-pay | Admitting: Internal Medicine

## 2019-03-18 ENCOUNTER — Ambulatory Visit: Payer: BC Managed Care – PPO | Admitting: Internal Medicine

## 2019-03-18 VITALS — BP 112/70 | HR 85 | Ht 66.0 in | Wt 163.0 lb

## 2019-03-18 DIAGNOSIS — R0600 Dyspnea, unspecified: Secondary | ICD-10-CM

## 2019-03-18 DIAGNOSIS — J986 Disorders of diaphragm: Secondary | ICD-10-CM | POA: Diagnosis not present

## 2019-03-18 DIAGNOSIS — I5189 Other ill-defined heart diseases: Secondary | ICD-10-CM | POA: Diagnosis not present

## 2019-03-18 DIAGNOSIS — J3802 Paralysis of vocal cords and larynx, bilateral: Secondary | ICD-10-CM

## 2019-03-18 DIAGNOSIS — R0609 Other forms of dyspnea: Secondary | ICD-10-CM

## 2019-03-18 NOTE — Progress Notes (Signed)
IOV 01/22/2017  Chief Complaint  Patient presents with  . Advice Only    abnormal CXR right lung, CT scan collapsed lung. shoulder surgery july '18, mod dyspnea on exertoin   63 year old female new consult. She is here with her husband. 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 She tells me that [place] in [place] of [place] she had left shoulder surgery and after that she started noticing new onset of shortness of breathfor activities much lighter than before. She tells me that she gets dyspneic now cleaning bathrooms all walking upstairs relieved by rest. This no associated cough or wheezing. She has a family history of asthma and she's been on ACE inhibitors for 20 years but she does not have any cough or wheezing. The onset of dyspnea is definitely related to the left shoulder surgery. This no orthopnea paroxysmal nocturnal dyspnea or cough or hemoptysis. This no fever or weight loss. She underwent evaluation by her primary care physician. She had a CT chest with contrast. Images are not available for my visualization. Date of test is 01/11/2017. The report shows that she has markedly elevation of right diaphragm associated with right middle lobe and right lower lobe atelectasis. In addition she has fatty livershe also has an aberrant right subclavian artery. She denies any chest pain or previous cardiac workup. There is no weight loss  Walking desaturation test done today on room air with a full head probe. Resting pulse ox 95%. Resting heart rate 92%. Final heart rate 104/m. Resting pulse ox 94%. Did not desaturate.   OV 02/14/2017  Chief Complaint  Patient presents with  . Follow-up    Pt states that she is the same as she was at last visit. States that she is still becoming SOB when doing minor activities but has learned to slow down, coughing usually when laying down at night, and  occ. CP.   Fu test results. Acute dyspnea following sholder surgery on left side.   1. 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 ECho  - stiff heart muscle that does not relax well: pressure (grade 2 diastolic dysfunction). - Pulmonary arteries: Systolic pressure was mildly increased. PA   peak pressure: 35 mm Hg (S)  3. D-dimer high but borderline: Duplex LE Ultrasound - negative for DVT.    4. FeNO 02/14/2017 -= 22pb and normal   OV 03/18/2017  Chief Complaint  Patient presents with  . Follow-up    Pt has begun rehab at [facility] and stated that since she has neuropathy she does not know how long she can do it.  Pt has had some tightness in chest x2 days and does have SOB when doing activities.  Denies any cough.     Follow-up dyspnea due to paralyzed right hemidiaphragm and also diastolic dysfunction  Since her last visit 5 weeks ago she's lost weight. She is also taking Lasix. These measures seemed to have helped her dyspnea little bit only. Nevertheless it is definitely a little bit better. She just started pulmonary rehabilitation 1 class and did well. 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 She status post back surgeries 20 years ago but has been dealing with bilateral feet neuropathy only in the last 1 year. She's not interested in second opinion for dia   05/07/2017 Acute OV : Chest pain  Patient presents for an  acute office visit.  Patient complains of 3 days of mid chest pain and tightness.  She says that she started having some tightness in the middle part of her chest that is been pretty constant over the last 3 days.  It is sore to touch.  She has no associated increased dyspnea, cough, hemoptysis or increased edema.  She is not taking any medications for relief.  She denies any radiating pains into her shoulder or back or neck.  Patient says she does have a family history of heart disease in a sister and father. EKG today shows NSR ,  nonspecific changes .   Patient is followed for chronic dyspnea felt secondary to a paralyzed right   hemidiaphragm and diastolic heart failure.  She is currently involved in pulmonary rehab.  She says overall breathing shortness of breath are back at baseline.  She is not seeing significant improvement in her dyspnea since starting pulmonary rehab.  Does feel like that she is more conditioned.   phragmatic paralysis  OV 06/17/2017  Chief Complaint  Patient presents with  . Follow-up    Pt states she has been having a rough time. States she is more problems with SOB even doing little things such as changing sheets on bed, has occ. cough.   Follow-up paralyzed right diaphragm after surgery?  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  At this point in time she tells me that she is no better.  She has tried pulmonary rehabilitation [currently on hold because of needing clearance from Dr. Harl Bowie cardiology] she is on Lasix and she has lost some weight and done exercise but despite all this dyspnea persist.  Vacuuming doing household work or any kind of exertion like walking uphill with the dog makes her dyspneic.  She is applied for disability and it is that bad.  She is also reporting hoarseness of voice that she tells me has been present ever since the surgery associated with the frozen diaphragm.  She tells me that she spoke to the anesthesiologist who did not report any adverse events at the time of surgery or after the surgery.  The hoarseness of voice persists.  She has not seen ENT for this.  She is open to getting a second opinion.  I gave the CD-ROM of a CT scan of the chest back to her.  In February 2019 she had a chest x-ray that still shows elevated right hemidiaphragm.   OV 09/25/2017  Chief Complaint  Patient presents with  . Follow-up    Pt states she has been doing well since last visit. States she has been able to adapt to SOB and  has been doing exercises to help.    Follow-up dyspnea due to paralyzed right hemidiaphragm after shoulder surgery and also associated diastolic dysfunction  Last visit was in March 2019. At that point in time because of persistent symptoms I referred her to [facility] and also because of her new onset of hoarseness of voice that she revealed that started after the surgery and referred her to ENT. 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 She's did see DR Ronalee Red in pulmonary at [facility] in April 2019. I reviewed the notes and agree with his findings. His recommendation is to do neck MRI and EMG study. He does not think this is Parsonage Turner syndrome. And I agree with him on this. Of note, she is upcoming appointment with Dr. Vertell Limber in neurosurgery. Dr. Vertell Limber has  seen her for low back issues and operated on her many years ago. Most recently in January 2019 she did visit with him because of light up in the low back. She has upcoming appointment with him in 10/09/2017.   . OV 03/06/2018  Subjective:  Patient ID: [name], female , DOB: Nov 07, 1955 , age 8 y.o. , MRN: [id number] , ADDRESS: [address] [id number]   03/06/2018 -   Chief Complaint  Patient presents with  . Follow-up    Dyspnea on exertion, still getting winded with exertion but after stopping she recovers well    Follow-up dyspnea due to paralyzed right hemidiaphragm (dyspnea soon after)  and vocal cord paralysis (a month or so)  after shoulder surgery in July 20198 and also associated diastolic dysfunction   HPI ERLEEN EGNER 63 y.o. -follow-up for the above.  In the interim no new issues.  She decided to come for follow-up a little bit earlier than scheduled because her husband is having foot surgery.  She says she continues to have dyspnea issues with climbing stairs and also hoarseness of voice because of vocal cord paralysis.  There are no new weakness issues.  She still puzzled by the onset of her  symptoms.  She says she is spoken to orthopedic and the anesthesiologist and so far no etiology found.  She says she takes things slowly.  She is up-to-date with the flu shot.  There is no orthopnea proximal nocturnal dyspnea wheezing or edema.  We discussed today and she has never seen a neurologist.  She is open to seeing one.     OV 03/18/2019  Subjective:  Patient ID: [name], female , DOB: [birth year] , age 33 y.o. , MRN: [id number] , ADDRESS: [id number] [id number]  Follow-up dyspnea due to paralyzed right hemidiaphragm (dyspnea soon after)  and vocal cord paralysis (a month or so)  after shoulder surgery in July 2018 and also associated diastolic dysfunction. area. She has normal neck MRI. EMG with NCV was also normal.  MRI cervical spine without contrast 10/22/2017: 1. 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 2. Moderate right C3-4 facet arthrosis causes mild narrowing of the right neural foramen.  MRI neck 10/22/2017:Normal MRI appearance of the neck. No imaging finding of vocal cord paralysis. No abnormality along the courses of the recurrent laryngeal nerves.   03/18/2019 -   Chief Complaint  Patient presents with  . Follow-up    Pt states she has been doing okay but states with the cold weather she has been having SOB.     HPI DEIJA BUHRMAN 63 y.o. -presents for follow-up.  Have not seen her just over a year.  She presents with her husband.  She tells me that overall she is stable.  She is known to adapt to her bilateral vocal cord paralysis and diaphragmatic paralysis and her dyspnea.  Her husband states by middle of the day she gets exhausted and her voice changes.  She does have dyspnea walking up stairs but overall she is stable.  No new issues.  After my visit with her in December 2019 she followed up with Dr. Posey Pronto in neurology in March 2020.  I did visualize and read her notes.  It appears that the etiology of paralysis is  idiopathic.  She is on observation therapy.  She is followed up with [facility] in June 2020.  She is on a as needed  follow-up.  At this point in time there are no new issues.  Is up-to-date with the flu shot.  She is social distancing and the COVID-19 pandemic.  She plans to have a COVID-19 vaccine.  ROS - per HPI     has a past medical history of Celiac disease, GERD (gastroesophageal reflux disease), Hemidiaphragm paralysis, Hypertension, and Vocal cord paralysis.   reports that she has never smoked. She has never used smokeless tobacco.  Past Surgical History:  Procedure Laterality Date  . BREAST SURGERY     knot removed  . COLONOSCOPY N/A 08/21/2017   Procedure: COLONOSCOPY;  Surgeon: Rogene Houston, MD;  [facility];  Service: Endoscopy;  Laterality: N/A;  100  . DILATION AND CURETTAGE OF UTERUS     multpile  . POLYPECTOMY  08/21/2017   Procedure: POLYPECTOMY;  Surgeon: Rogene Houston, MD;  Location: [facility];  Service: Endoscopy;;  colon   . ROTATOR CUFF REPAIR    . SALPINGOOPHORECTOMY Left 1975  . SPINE SURGERY      Allergies  Allergen Reactions  . Codeine Nausea And Vomiting  . Hydrocodone Nausea And Vomiting and Rash  . Tramadol Nausea And Vomiting    Immunization History  Administered Date(s) Administered  . Influenza,inj,Quad PF,6+ Mos 12/23/2018  . Influenza-Unspecified 01/31/2017    Family History  Problem Relation Age of Onset  . Asthma Mother   . Cancer Mother        breast  . Hypertension Mother   . Arthritis Mother   . Cancer Father        colon  . Kidney disease Father   . Cancer Sister        breast  . Hypertension Sister   . Parkinson's disease Paternal Grandmother   . Stroke Paternal Grandmother      Current Outpatient Medications:  .  benazepril (LOTENSIN) 10 MG tablet, 10 mg daily. , Disp: , Rfl: 3 .  betamethasone dipropionate 0.05 % cream, APPLY CREAM TOPICALLY TWICE DAILY, Disp: 30 g, Rfl: 0 .   cyanocobalamin (,VITAMIN B-12,) 1000 MCG/ML injection, Inject 1 ml IM monthly, Disp: 30 mL, Rfl: 6 .  DULoxetine (CYMBALTA) 30 MG capsule, Take 30 mg by mouth 2 (two) times daily. , Disp: , Rfl:  .  furosemide (LASIX) 40 MG tablet, Take 1 tablet (40 mg total) daily by mouth., Disp: 60 tablet, Rfl: 3 .  gabapentin (NEURONTIN) 100 MG capsule, Take 100 mg by mouth 3 (three) times daily. , Disp: , Rfl:  .  omeprazole (PRILOSEC) 20 MG capsule, Take 20 mg by mouth daily., Disp: , Rfl:  .  Potassium Chloride ER 20 MEQ TBCR, Take 20 mEq daily by mouth., Disp: 60 tablet, Rfl: 3 .  spironolactone (ALDACTONE) 25 MG tablet, Take 25 mg by mouth daily. , Disp: , Rfl:  .  TESTOSTERONE NA, Apply 1 application topically 3 (three) times a week. 2% cream Monday, Wednesday & Friday, Disp: , Rfl:       Objective:   Vitals:   03/18/19 0936  BP: 112/70  Pulse: 85  SpO2: 99%  Weight: 163 lb (73.9 kg)  Height: 5' 6"  (1.676 m)    Estimated body mass index is 26.31 kg/m as calculated from the following:   Height as of this encounter: 5' 6"  (1.676 m).   Weight as of this encounter: 163 lb (73.9 kg).  @WEIGHTCHANGE @  Filed Weights   03/18/19 0936  Weight: 163 lb (73.9 kg)  Physical Exam  General Appearance:    Alert, cooperative, no distress, appears stated age - yes , Deconditioned looking - no , OBESE  - no, Sitting on Wheelchair -  no  Head:    Normocephalic, without obvious abnormality, atraumatic  Eyes:    PERRL, conjunctiva/corneas clear,  Ears:    Normal TM's and external ear canals, both ears  Nose:   Nares normal, septum midline, mucosa normal, no drainage    or sinus tenderness. OXYGEN ON  - no . Patient is @ ra   Throat:   Lips, mucosa, and tongue normal; teeth and gums normal. Cyanosis on lips - no  Neck:   Supple, symmetrical, trachea midline, no adenopathy;    thyroid:  no enlargement/tenderness/nodules; no carotid   bruit or JVD  Back:     Symmetric, no curvature, ROM normal, no  CVA tenderness  Lungs:     Distress - no , Wheeze no, Barrell Chest - no, Purse lip breathing - no, Crackles - no   Chest Wall:    No tenderness or deformity.    Heart:    Regular rate and rhythm, S1 and S2 normal, no rub   or gallop, Murmur - no  Breast Exam:    NOT DONE  Abdomen:     Soft, non-tender, bowel sounds active all four quadrants,    no masses, no organomegaly. Visceral obesity - no  Genitalia:   NOT DONE  Rectal:   NOT DONE  Extremities:   Extremities - normal, Has Cane - no, Clubbing - no, Edema - no  Pulses:   2+ and symmetric all extremities  Skin:   Stigmata of Connective Tissue Disease - no  Lymph nodes:   Cervical, supraclavicular, and axillary nodes normal  Psychiatric:  Neurologic:   Pleasant - yes, Anxious - no, Flat affect - no  CAm-ICU - neg, Alert and Oriented x 3 - yes, Moves all 4s - yes, Speech - normal, Cognition - intact           Assessment:       ICD-10-CM   1. Dyspnea on exertion  R06.00   2. Bilateral vocal cord paralysis  J38.02   3. 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 Diastolic dysfunction  B15.17        Plan:     Patient Instructions     ICD-10-CM   1. Dyspnea on exertion  R06.00   2. Bilateral vocal cord paralysis  J38.02   3. 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 Diastolic dysfunction  O16.07    STable  Continue supportive care Continue exercise and conditioning Recommend re-visit with Dr Elgie Congo  Followup 1 year or sooner   (> 50% of this 15 min visit spent in  face to face counseling or/and coordination of care by this undersigned MD - Dr Brand Males. This includes one or more of the following documented above: discussion of test results, diagnostic or treatment recommendations, prognosis, risks and benefits of management options, instructions, education, compliance or risk-factor reduction)   SIGNATURE    Dr. Brand Males, M.D., F.C.C.P,  Pulmonary and Critical Care Medicine Staff Physician, [place]  Director - Interstitial Lung Disease  Program  [facility] [place], [place], [address]  Pager: [phone], If no answer or between  15:00h - 7:00h: call [phone] Telephone: [phone]  9:56 AM 03/18/2019

## 2019-03-18 NOTE — Patient Instructions (Signed)
ICD-10-CM   1. Dyspnea on exertion  R06.00   2. Bilateral vocal cord paralysis  J38.02   3. 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 Diastolic dysfunction  Z83.46    STable  Continue supportive care Continue exercise and conditioning Recommend re-visit with Dr Elgie Congo  Followup 1 year or sooner

## 2019-03-23 ENCOUNTER — Ambulatory Visit: Payer: BC Managed Care – PPO | Admitting: Obstetrics and Gynecology

## 2019-03-23 ENCOUNTER — Encounter: Payer: Self-pay | Admitting: Obstetrics and Gynecology

## 2019-03-23 ENCOUNTER — Other Ambulatory Visit: Payer: Self-pay

## 2019-03-23 VITALS — BP 127/76 | HR 90 | Ht 66.0 in | Wt 162.2 lb

## 2019-03-23 DIAGNOSIS — N904 Leukoplakia of vulva: Secondary | ICD-10-CM | POA: Diagnosis not present

## 2019-03-23 NOTE — Progress Notes (Addendum)
Patient ID: Jordan Weaver, female   DOB: 03-07-1956, 63 y.o.   MRN: [id number]    [facility] Visit  @DATE @            Patient name: Jordan Weaver MRN [id number]  Date of birth: Jan 03, 1956  CC & HPI:  Jordan Weaver is a 63 y.o. 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 She was having small pain around clitoral area and feels a small bump in vaginal area.  On the left labia, and is unable to be sure it is a skin lesion to be concerned about.  She presents for examination.  She is tolerating the severe lichen sclerosis and atrophicus, (atrophic vulvar dystrophy) on her current regimen alternating clobetasol 0.05% and testosterone cream 2%  After her shoulder surgery it resulted in paralyzed vocal chords, diaphragm and has SOB upon exertion.  She can't say for sure if surgery caused paralysis but the issues arose after surgery.  The vocal cord and the diaphragm hemiparalysis is on the right, and the shoulder surgery under a nerve block was on the left.  She has reduced energy levels due to the difficulty with breathing with absent diaphragm activity on the right  ROS:  ROS +lichen schelorosis -infection -fever -chills Vocal cord and right diaphragm dysfunction/paralysis All systems are negative except as noted in the HPI and PMH.    Pertinent History Reviewed:   Reviewed: Significant for Medical         Past Medical History:  Diagnosis Date  . Celiac disease   . GERD (gastroesophageal reflux disease)   . Hemidiaphragm paralysis   . Hypertension   . Vocal cord paralysis                               Surgical Hx:    Past Surgical History:  Procedure Laterality Date  . BREAST SURGERY     knot removed  . COLONOSCOPY N/A 08/21/2017   Procedure: COLONOSCOPY;  Surgeon: Rogene Houston, MD;  [facility];  Service: Endoscopy;  Laterality: N/A;  100  . DILATION AND CURETTAGE OF UTERUS     multpile  . POLYPECTOMY  08/21/2017   Procedure: POLYPECTOMY;  Surgeon:  Rogene Houston, MD;  [facility];  Service: Endoscopy;;  colon   . ROTATOR CUFF REPAIR    . SALPINGOOPHORECTOMY Left 1975  . SPINE SURGERY     Medications: Reviewed & Updated - see associated section                       Current Outpatient Medications:  .  benazepril (LOTENSIN) 10 MG tablet, 10 mg daily. , Disp: , Rfl: 3 .  betamethasone dipropionate 0.05 % cream, APPLY CREAM TOPICALLY TWICE DAILY, Disp: 30 g, Rfl: 0 .  cyanocobalamin (,VITAMIN B-12,) 1000 MCG/ML injection, Inject 1 ml IM monthly, Disp: 30 mL, Rfl: 6 .  DULoxetine (CYMBALTA) 30 MG capsule, Take 30 mg by mouth 2 (two) times daily. , Disp: , Rfl:  .  furosemide (LASIX) 40 MG tablet, Take 1 tablet (40 mg total) daily by mouth., Disp: 60 tablet, Rfl: 3 .  omeprazole (PRILOSEC) 20 MG capsule, Take 20 mg by mouth daily., Disp: , Rfl:  .  Potassium Chloride ER 20 MEQ TBCR, Take 20 mEq daily by mouth., Disp: 60 tablet, Rfl: 3 .  pregabalin (LYRICA) 50 MG capsule, Take 50-100 mg by mouth  2 (two) times daily., Disp: , Rfl:  .  spironolactone (ALDACTONE) 25 MG tablet, Take 25 mg by mouth daily. , Disp: , Rfl:  .  TESTOSTERONE NA, Apply 1 application topically 3 (three) times a week. 2% cream Monday, Wednesday & Friday, Disp: , Rfl:    Social History: Reviewed -  reports that she has never smoked. She has never used smokeless tobacco.  Objective Findings:  Vitals: Blood pressure 127/76, pulse 90, height 5' 6"  (1.676 m), weight 162 lb 3.2 oz (73.6 kg).  PHYSICAL EXAMINATION General appearance - alert, well appearing, and in no distress Mental status - alert, oriented to person, place, and time, normal mood, behavior, speech, dress, motor activity, and thought processes, affect appropriate to mood  PELVIC External genitalia - clitoral tissue disconnects from clitoris, sebaceous cysts Vulva - lichen schelorosis, doing better no erythema, thin parchment tissues.  At present the posterior fourchette and the clitoral  area are both intact, though cracking does occasionally occur Vagina - not examined Cervix - not examined Uterus - not examined   Assessment & Plan:   A:  1.  lichen sclerosis (atrophic vulvar dystrophy) 2. Sebaceous cysts, stable  P:  1.  Continue bethamethasone & testosterone cream alternating for #1    By signing my name below, I, Samul Dada, attest that this documentation has been prepared under the direction and in the presence of Jonnie Kind, MD. Electronically Signed: [place]. 03/23/19. 9:04 AM.  I personally performed the services described in this documentation, which was SCRIBED in my presence. The recorded information has been reviewed and considered accurate. It has been edited as necessary during review. Jonnie Kind, MD

## 2019-04-17 ENCOUNTER — Other Ambulatory Visit: Payer: BC Managed Care – PPO | Admitting: Adult Health

## 2019-05-04 ENCOUNTER — Other Ambulatory Visit: Payer: Self-pay

## 2019-05-04 ENCOUNTER — Encounter: Payer: Self-pay | Admitting: Cardiology

## 2019-05-04 ENCOUNTER — Ambulatory Visit: Payer: 59 | Admitting: Cardiology

## 2019-05-04 VITALS — BP 117/76 | HR 89 | Temp 97.6°F | Ht 66.0 in | Wt 166.0 lb

## 2019-05-04 DIAGNOSIS — R0789 Other chest pain: Secondary | ICD-10-CM | POA: Diagnosis not present

## 2019-05-04 DIAGNOSIS — I5032 Chronic diastolic (congestive) heart failure: Secondary | ICD-10-CM | POA: Diagnosis not present

## 2019-05-04 NOTE — Progress Notes (Signed)
Clinical Summary Jordan Weaver is a 64 y.o.female seen today for follow up of the following medical problems.  1. Chest pain - at prior visit reported midchest pressure/tightness. Up to 6-7/10 in severity. No other associated pain. Not positional - constant pain x 2 dayswithout relief. Can be tender to palpation. - treated with topical lidocainepatch by pcp with some improvement.  - at prior visit still with pain at times. Often with burping, often occurs after taking her pills in the morning. Overall symptoms better from prior visit.    - chronic atypical symptoms. No exertional symptoms.    2.Chronic diastolic HF - 86/7544 echo: LVEF 60-65%, no WMAs, grade II diastoilc dysfunction.  -chronic stable SOB. Can have LE edema at times.  - limiting sodium intake   3. 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 Celiac disease   . GERD (gastroesophageal reflux disease)   . Hemidiaphragm paralysis   . Hypertension   . Vocal cord paralysis      Allergies  Allergen Reactions  . Codeine Nausea And Vomiting  . Hydrocodone Nausea And Vomiting and Rash  . Tramadol Nausea And Vomiting     Current Outpatient Medications  Medication Sig Dispense Refill  . benazepril (LOTENSIN) 10 MG tablet 10 mg daily.   3  . betamethasone dipropionate 0.05 % cream APPLY CREAM TOPICALLY TWICE DAILY 30 g 0  . cyanocobalamin (,VITAMIN B-12,) 1000 MCG/ML injection Inject 1 ml IM monthly 30 mL 6  . DULoxetine (CYMBALTA) 30 MG capsule Take 30 mg by mouth 2 (two) times daily.     . furosemide (LASIX) 40 MG tablet Take 1 tablet (40 mg total) daily by mouth. 60 tablet 3  . omeprazole (PRILOSEC) 20 MG capsule Take 20 mg by mouth daily.    . Potassium Chloride ER 20 MEQ TBCR Take  20 mEq daily by mouth. 60 tablet 3  . pregabalin (LYRICA) 50 MG capsule Take 50-100 mg by mouth 2 (two) times daily.    [name] spironolactone (ALDACTONE) 25 MG tablet Take 25 mg by mouth daily.     . TESTOSTERONE NA Apply 1 application topically 3 (three) times a week. 2% cream Monday, Wednesday & Friday     No current facility-administered medications for this visit.     Past Surgical History:  Procedure Laterality Date  . BREAST SURGERY     knot removed  . COLONOSCOPY N/A 08/21/2017   Procedure: COLONOSCOPY;  Surgeon: Rogene Houston, MD;  [facility]: [facility];  Service: Endoscopy;  Laterality: N/A;  100  . DILATION AND CURETTAGE OF UTERUS     multpile  . POLYPECTOMY  08/21/2017   Procedure: POLYPECTOMY;  Surgeon: Rogene Houston, MD;  Location: [facility];  Service: Endoscopy;;  colon   . ROTATOR CUFF REPAIR    . SALPINGOOPHORECTOMY Left 1975  . SPINE SURGERY       Allergies  Allergen Reactions  . Codeine Nausea And Vomiting  . Hydrocodone Nausea And Vomiting and Rash  . Tramadol Nausea And Vomiting      Family History  Problem Relation Age of Onset  . Asthma Mother   . Cancer Mother        breast  . Hypertension Mother   . Arthritis Mother   .  Cancer Father        colon  . Kidney disease Father   . Cancer Sister        breast  . Hypertension Sister   . Parkinson's disease Paternal Grandmother   . Stroke Paternal Grandmother      Social History Ms. Cain reports that she has never smoked. She has never used smokeless tobacco. Ms. Mckercher reports no history of alcohol use.   Review of Systems CONSTITUTIONAL: No weight loss, fever, chills, weakness or fatigue.  HEENT: Eyes: No visual loss, blurred vision, double vision or yellow sclerae.No hearing loss, sneezing, congestion, runny nose or sore throat.  SKIN: No rash or itching.  CARDIOVASCULAR: per hpi RESPIRATORY: No shortness of breath, cough or sputum.  GASTROINTESTINAL: No anorexia, nausea, vomiting  or diarrhea. No abdominal pain or blood.  GENITOURINARY: No burning on urination, no polyuria NEUROLOGICAL: No headache, dizziness, syncope, paralysis, ataxia, numbness or tingling in the extremities. No change in bowel or bladder control.  MUSCULOSKELETAL: No muscle, back pain, joint pain or stiffness.  LYMPHATICS: No enlarged nodes. No history of splenectomy.  PSYCHIATRIC: No history of depression or anxiety.  ENDOCRINOLOGIC: No reports of sweating, cold or heat intolerance. No polyuria or polydipsia.  [name]   Physical Examination Today's Vitals   05/04/19 0907  BP: 117/76  Pulse: 89  Temp: 97.6 F (36.4 C)  SpO2: 96%  Weight: 166 lb (75.3 kg)  Height: 5' 6"  (1.676 m)   Body mass index is 26.79 kg/m.  Gen: resting comfortably, no acute distress HEENT: no scleral icterus, pupils equal round and reactive, no palptable cervical adenopathy,  CV: RRR, no m/r/g, no jvd Resp: Clear to auscultation bilaterally GI: abdomen is soft, non-tender, non-distended, normal bowel sounds, no hepatosplenomegaly MSK: extremities are warm, no edema.  Skin: warm, no rash Neuro:  no focal deficits Psych: appropriate affect   Diagnostic Studies 12/2016 echo Study Conclusions  - Left ventricle: The cavity size was normal. Wall thickness was increased in a pattern of mild LVH. Systolic function was normal. The estimated ejection fraction was in the range of 60% to 65%. Wall motion was normal; there were no regional wall motion abnormalities. Features are consistent with a pseudonormal left ventricular filling pattern, with concomitant abnormal relaxation and increased filling pressure (grade 2 diastolic dysfunction). - Pulmonary arteries: Systolic pressure was mildly increased. PA peak pressure: 35 mm Hg (S).    Assessment and Plan   1. Atypical chest pain -chronic symptoms, has not been consistent with cardiac chest pain - continue to monitor   2. Chronic diastolic  HF - echo with grade II diastolic dysfunction. - overall doing well on current lasix dosing, continue current therapy    F/u 1 year.        Arnoldo Lenis, M.D.

## 2019-05-04 NOTE — Patient Instructions (Signed)

## 2019-06-02 ENCOUNTER — Other Ambulatory Visit: Payer: Self-pay

## 2019-06-02 ENCOUNTER — Other Ambulatory Visit (HOSPITAL_COMMUNITY)
Admission: RE | Admit: 2019-06-02 | Discharge: 2019-06-02 | Disposition: A | Discharge status: Home / Self Care | Payer: 59 | Source: Ambulatory Visit | Attending: Adult Health | Admitting: Adult Health

## 2019-06-02 ENCOUNTER — Encounter: Payer: Self-pay | Admitting: Adult Health

## 2019-06-02 ENCOUNTER — Ambulatory Visit (INDEPENDENT_AMBULATORY_CARE_PROVIDER_SITE_OTHER): Payer: 59 | Admitting: Adult Health

## 2019-06-02 VITALS — BP 117/73 | HR 81 | Ht 65.5 in | Wt 167.0 lb

## 2019-06-02 DIAGNOSIS — Z1212 Encounter for screening for malignant neoplasm of rectum: Secondary | ICD-10-CM

## 2019-06-02 DIAGNOSIS — Z1211 Encounter for screening for malignant neoplasm of colon: Secondary | ICD-10-CM | POA: Diagnosis not present

## 2019-06-02 DIAGNOSIS — Z01419 Encounter for gynecological examination (general) (routine) without abnormal findings: Secondary | ICD-10-CM | POA: Insufficient documentation

## 2019-06-02 DIAGNOSIS — N952 Postmenopausal atrophic vaginitis: Secondary | ICD-10-CM

## 2019-06-02 DIAGNOSIS — L900 Lichen sclerosus et atrophicus: Secondary | ICD-10-CM | POA: Insufficient documentation

## 2019-06-02 LAB — HEMOCCULT GUIAC POC 1CARD (OFFICE): Fecal Occult Blood, POC: NEGATIVE

## 2019-06-02 NOTE — Progress Notes (Signed)
Patient ID: Jordan Weaver, female   DOB: 10-Mar-1956, 64 y.o.   MRN: [id number] History of Present Illness: Jordan Weaver is a 64 year old white female, married, G1P0010,PM,in for a well woman gyn exam and pap. PCP is Dr Lorra Hals   Current Medications, Allergies, Past Medical History, Past Surgical History, Family History and Social History were reviewed in [facility] record.     Review of Systems: Patient denies any headaches, hearing loss, fatigue, blurred vision, shortness of breath,  abdominal pain, problems with bowel movements, urination.  No joint pain or mood swings. Has pain with sex, only has about 3 x a year.Has LSA.  Has had chest pain and has seen cardiologist and has soreness left underarm , none now.   Physical Exam:BP 117/73 (BP Location: Left Arm, Patient Position: Sitting, Cuff Size: Normal)   Pulse 81   Ht 5' 5.5" (1.664 m)   Wt 167 lb (75.8 kg)   BMI 27.37 kg/m  General:  Well developed, well nourished, no acute distress Skin:  Warm and dry Neck:  Midline trachea, normal thyroid, good ROM, no lymphadenopathy,no carotid bruits heard Lungs; Clear to auscultation bilaterally Breast:  No dominant palpable mass, retraction, or nipple discharge, has old scar under left areola, has some slight tenderness left under arm no masses flet Cardiovascular: Regular rate and rhythm Abdomen:  Soft, non tender, no hepatosplenomegaly Pelvic:  External genitalia is normal in appearance,  But is slightly red.The vagina is atrophic, has thin white area near clitoris and at introitus. Urethra has no lesions or masses. The cervix is smooth, pap with high risk HPV 16/18 performed.  Uterus is felt to be normal size, shape, and contour.  No adnexal masses or tenderness noted.Bladder is non tender, no masses felt. Rectal: Good sphincter tone, no polyps, or hemorrhoids felt.  Hemoccult negative. Extremities/musculoskeletal:  No swelling or varicosities noted, no clubbing or  cyanosis Psych:  No mood changes, alert and cooperative,seems happy Fall risk is low PHQ 2 score is 0.  Co exam with Terri Skains NP student.  Impression and Plan: 1. Encounter for gynecological examination with Papanicolaou smear of cervix Pap sent Physical in 1 year Pap in 3 if normal Mammogram yearly Labs with PCP and cardiologist  2. Screening for colorectal cancer Colonoscopy per Dr Laural Golden  3. Lichen sclerosus et atrophicus Continue testosterone cream and betamethasone cream, but use betamethasone once a day on days not using testosterone cream, as skin looks thin,she has refills  4. Vaginal atrophy

## 2019-06-04 LAB — CYTOLOGY - PAP
Comment: NEGATIVE
Diagnosis: NEGATIVE
High risk HPV: NEGATIVE

## 2019-07-08 DIAGNOSIS — M51369 Other intervertebral disc degeneration, lumbar region without mention of lumbar back pain or lower extremity pain: Secondary | ICD-10-CM | POA: Insufficient documentation

## 2019-10-06 DIAGNOSIS — M2012 Hallux valgus (acquired), left foot: Secondary | ICD-10-CM | POA: Diagnosis not present

## 2019-10-06 DIAGNOSIS — M2042 Other hammer toe(s) (acquired), left foot: Secondary | ICD-10-CM | POA: Diagnosis not present

## 2019-10-06 DIAGNOSIS — L851 Acquired keratosis [keratoderma] palmaris et plantaris: Secondary | ICD-10-CM | POA: Diagnosis not present

## 2019-10-12 DIAGNOSIS — M545 Low back pain: Secondary | ICD-10-CM | POA: Diagnosis not present

## 2019-10-12 DIAGNOSIS — M5416 Radiculopathy, lumbar region: Secondary | ICD-10-CM | POA: Diagnosis not present

## 2019-10-12 DIAGNOSIS — M47816 Spondylosis without myelopathy or radiculopathy, lumbar region: Secondary | ICD-10-CM | POA: Diagnosis not present

## 2019-10-12 DIAGNOSIS — M5136 Other intervertebral disc degeneration, lumbar region: Secondary | ICD-10-CM | POA: Diagnosis not present

## 2019-10-16 DIAGNOSIS — E781 Pure hyperglyceridemia: Secondary | ICD-10-CM | POA: Diagnosis not present

## 2019-10-16 DIAGNOSIS — E538 Deficiency of other specified B group vitamins: Secondary | ICD-10-CM | POA: Insufficient documentation

## 2019-10-16 DIAGNOSIS — K760 Fatty (change of) liver, not elsewhere classified: Secondary | ICD-10-CM | POA: Diagnosis not present

## 2019-10-16 DIAGNOSIS — E161 Other hypoglycemia: Secondary | ICD-10-CM | POA: Diagnosis not present

## 2019-10-16 DIAGNOSIS — Z6826 Body mass index (BMI) 26.0-26.9, adult: Secondary | ICD-10-CM | POA: Diagnosis not present

## 2019-10-16 DIAGNOSIS — Z1231 Encounter for screening mammogram for malignant neoplasm of breast: Secondary | ICD-10-CM | POA: Diagnosis not present

## 2019-10-16 DIAGNOSIS — Z0000 Encounter for general adult medical examination without abnormal findings: Secondary | ICD-10-CM | POA: Diagnosis not present

## 2019-10-16 DIAGNOSIS — I10 Essential (primary) hypertension: Secondary | ICD-10-CM | POA: Diagnosis not present

## 2019-11-09 DIAGNOSIS — M5416 Radiculopathy, lumbar region: Secondary | ICD-10-CM | POA: Diagnosis not present

## 2019-11-09 DIAGNOSIS — M545 Low back pain: Secondary | ICD-10-CM | POA: Diagnosis not present

## 2019-11-09 DIAGNOSIS — M47816 Spondylosis without myelopathy or radiculopathy, lumbar region: Secondary | ICD-10-CM | POA: Diagnosis not present

## 2019-11-09 DIAGNOSIS — M5136 Other intervertebral disc degeneration, lumbar region: Secondary | ICD-10-CM | POA: Diagnosis not present

## 2019-11-12 DIAGNOSIS — Z6827 Body mass index (BMI) 27.0-27.9, adult: Secondary | ICD-10-CM | POA: Diagnosis not present

## 2019-11-12 DIAGNOSIS — R5383 Other fatigue: Secondary | ICD-10-CM | POA: Diagnosis not present

## 2019-11-12 DIAGNOSIS — E538 Deficiency of other specified B group vitamins: Secondary | ICD-10-CM | POA: Diagnosis not present

## 2019-11-12 DIAGNOSIS — R5382 Chronic fatigue, unspecified: Secondary | ICD-10-CM | POA: Diagnosis not present

## 2019-11-12 DIAGNOSIS — K219 Gastro-esophageal reflux disease without esophagitis: Secondary | ICD-10-CM | POA: Diagnosis not present

## 2019-11-18 DIAGNOSIS — M47816 Spondylosis without myelopathy or radiculopathy, lumbar region: Secondary | ICD-10-CM | POA: Diagnosis not present

## 2019-11-18 DIAGNOSIS — M48061 Spinal stenosis, lumbar region without neurogenic claudication: Secondary | ICD-10-CM | POA: Diagnosis not present

## 2019-11-18 DIAGNOSIS — M5126 Other intervertebral disc displacement, lumbar region: Secondary | ICD-10-CM | POA: Diagnosis not present

## 2019-11-18 DIAGNOSIS — M5136 Other intervertebral disc degeneration, lumbar region: Secondary | ICD-10-CM | POA: Diagnosis not present

## 2019-11-18 DIAGNOSIS — M545 Low back pain: Secondary | ICD-10-CM | POA: Diagnosis not present

## 2019-11-18 DIAGNOSIS — M5416 Radiculopathy, lumbar region: Secondary | ICD-10-CM | POA: Diagnosis not present

## 2019-12-10 ENCOUNTER — Other Ambulatory Visit: Payer: PPO

## 2019-12-10 ENCOUNTER — Other Ambulatory Visit: Payer: Self-pay

## 2019-12-10 DIAGNOSIS — Z20822 Contact with and (suspected) exposure to covid-19: Secondary | ICD-10-CM | POA: Diagnosis not present

## 2019-12-12 LAB — SARS-COV-2, NAA 2 DAY TAT

## 2019-12-12 LAB — NOVEL CORONAVIRUS, NAA: SARS-CoV-2, NAA: NOT DETECTED

## 2019-12-14 ENCOUNTER — Other Ambulatory Visit (HOSPITAL_COMMUNITY): Payer: Self-pay | Admitting: Obstetrics and Gynecology

## 2019-12-14 DIAGNOSIS — Z1231 Encounter for screening mammogram for malignant neoplasm of breast: Secondary | ICD-10-CM

## 2019-12-16 DIAGNOSIS — Z23 Encounter for immunization: Secondary | ICD-10-CM | POA: Diagnosis not present

## 2019-12-16 DIAGNOSIS — K121 Other forms of stomatitis: Secondary | ICD-10-CM | POA: Diagnosis not present

## 2019-12-16 DIAGNOSIS — I10 Essential (primary) hypertension: Secondary | ICD-10-CM | POA: Diagnosis not present

## 2019-12-16 DIAGNOSIS — H9313 Tinnitus, bilateral: Secondary | ICD-10-CM | POA: Diagnosis not present

## 2019-12-16 DIAGNOSIS — R635 Abnormal weight gain: Secondary | ICD-10-CM | POA: Diagnosis not present

## 2019-12-16 DIAGNOSIS — Z6827 Body mass index (BMI) 27.0-27.9, adult: Secondary | ICD-10-CM | POA: Diagnosis not present

## 2019-12-31 DIAGNOSIS — M47816 Spondylosis without myelopathy or radiculopathy, lumbar region: Secondary | ICD-10-CM | POA: Diagnosis not present

## 2020-01-05 DIAGNOSIS — M2042 Other hammer toe(s) (acquired), left foot: Secondary | ICD-10-CM | POA: Diagnosis not present

## 2020-01-05 DIAGNOSIS — M2012 Hallux valgus (acquired), left foot: Secondary | ICD-10-CM | POA: Diagnosis not present

## 2020-01-05 DIAGNOSIS — L851 Acquired keratosis [keratoderma] palmaris et plantaris: Secondary | ICD-10-CM | POA: Diagnosis not present

## 2020-01-19 DIAGNOSIS — J9811 Atelectasis: Secondary | ICD-10-CM | POA: Diagnosis not present

## 2020-01-19 DIAGNOSIS — Z6827 Body mass index (BMI) 27.0-27.9, adult: Secondary | ICD-10-CM | POA: Diagnosis not present

## 2020-01-19 DIAGNOSIS — R079 Chest pain, unspecified: Secondary | ICD-10-CM | POA: Diagnosis not present

## 2020-01-19 DIAGNOSIS — J90 Pleural effusion, not elsewhere classified: Secondary | ICD-10-CM | POA: Diagnosis not present

## 2020-01-21 DIAGNOSIS — G8929 Other chronic pain: Secondary | ICD-10-CM | POA: Diagnosis not present

## 2020-01-21 DIAGNOSIS — M545 Low back pain, unspecified: Secondary | ICD-10-CM | POA: Diagnosis not present

## 2020-01-21 DIAGNOSIS — M5136 Other intervertebral disc degeneration, lumbar region: Secondary | ICD-10-CM | POA: Diagnosis not present

## 2020-01-21 DIAGNOSIS — M47816 Spondylosis without myelopathy or radiculopathy, lumbar region: Secondary | ICD-10-CM | POA: Diagnosis not present

## 2020-01-25 ENCOUNTER — Ambulatory Visit (HOSPITAL_COMMUNITY)
Admission: RE | Admit: 2020-01-25 | Discharge: 2020-01-25 | Disposition: A | Discharge status: Home / Self Care | Payer: PPO | Source: Ambulatory Visit | Attending: Obstetrics and Gynecology | Admitting: Obstetrics and Gynecology

## 2020-01-25 ENCOUNTER — Other Ambulatory Visit: Payer: Self-pay

## 2020-01-25 DIAGNOSIS — Z1231 Encounter for screening mammogram for malignant neoplasm of breast: Secondary | ICD-10-CM | POA: Diagnosis not present

## 2020-02-24 DIAGNOSIS — Z6827 Body mass index (BMI) 27.0-27.9, adult: Secondary | ICD-10-CM | POA: Diagnosis not present

## 2020-02-24 DIAGNOSIS — G43009 Migraine without aura, not intractable, without status migrainosus: Secondary | ICD-10-CM | POA: Diagnosis not present

## 2020-02-24 DIAGNOSIS — R7302 Impaired glucose tolerance (oral): Secondary | ICD-10-CM | POA: Diagnosis not present

## 2020-03-08 DIAGNOSIS — H903 Sensorineural hearing loss, bilateral: Secondary | ICD-10-CM | POA: Insufficient documentation

## 2020-03-08 DIAGNOSIS — H9313 Tinnitus, bilateral: Secondary | ICD-10-CM | POA: Diagnosis not present

## 2020-03-08 DIAGNOSIS — H9041 Sensorineural hearing loss, unilateral, right ear, with unrestricted hearing on the contralateral side: Secondary | ICD-10-CM | POA: Diagnosis not present

## 2020-03-16 DIAGNOSIS — M545 Low back pain, unspecified: Secondary | ICD-10-CM | POA: Diagnosis not present

## 2020-03-16 DIAGNOSIS — G8929 Other chronic pain: Secondary | ICD-10-CM | POA: Diagnosis not present

## 2020-03-16 DIAGNOSIS — M533 Sacrococcygeal disorders, not elsewhere classified: Secondary | ICD-10-CM | POA: Insufficient documentation

## 2020-03-16 DIAGNOSIS — M5416 Radiculopathy, lumbar region: Secondary | ICD-10-CM | POA: Diagnosis not present

## 2020-03-16 DIAGNOSIS — M47816 Spondylosis without myelopathy or radiculopathy, lumbar region: Secondary | ICD-10-CM | POA: Diagnosis not present

## 2020-04-25 ENCOUNTER — Other Ambulatory Visit: Payer: Self-pay | Admitting: *Deleted

## 2020-04-26 DIAGNOSIS — L851 Acquired keratosis [keratoderma] palmaris et plantaris: Secondary | ICD-10-CM | POA: Diagnosis not present

## 2020-04-26 DIAGNOSIS — M2042 Other hammer toe(s) (acquired), left foot: Secondary | ICD-10-CM | POA: Diagnosis not present

## 2020-04-26 DIAGNOSIS — M2012 Hallux valgus (acquired), left foot: Secondary | ICD-10-CM | POA: Diagnosis not present

## 2020-04-28 ENCOUNTER — Other Ambulatory Visit: Payer: PPO

## 2020-04-28 DIAGNOSIS — Z20822 Contact with and (suspected) exposure to covid-19: Secondary | ICD-10-CM

## 2020-04-28 DIAGNOSIS — M47816 Spondylosis without myelopathy or radiculopathy, lumbar region: Secondary | ICD-10-CM | POA: Diagnosis not present

## 2020-04-29 LAB — SARS-COV-2, NAA 2 DAY TAT

## 2020-04-29 LAB — NOVEL CORONAVIRUS, NAA: SARS-CoV-2, NAA: NOT DETECTED

## 2020-05-02 ENCOUNTER — Other Ambulatory Visit: Payer: Self-pay

## 2020-05-02 MED ORDER — BETAMETHASONE DIPROPIONATE 0.05 % EX CREA: TOPICAL_CREAM | CUTANEOUS | 0 refills | Status: DC

## 2020-05-16 DIAGNOSIS — Z6826 Body mass index (BMI) 26.0-26.9, adult: Secondary | ICD-10-CM | POA: Diagnosis not present

## 2020-05-16 DIAGNOSIS — N958 Other specified menopausal and perimenopausal disorders: Secondary | ICD-10-CM | POA: Diagnosis not present

## 2020-05-16 DIAGNOSIS — I10 Essential (primary) hypertension: Secondary | ICD-10-CM | POA: Diagnosis not present

## 2020-06-14 ENCOUNTER — Encounter: Payer: Self-pay | Admitting: Adult Health

## 2020-06-14 ENCOUNTER — Ambulatory Visit (INDEPENDENT_AMBULATORY_CARE_PROVIDER_SITE_OTHER): Payer: PPO | Admitting: Adult Health

## 2020-06-14 ENCOUNTER — Other Ambulatory Visit: Payer: Self-pay

## 2020-06-14 VITALS — BP 114/70 | HR 85 | Ht 65.25 in | Wt 161.0 lb

## 2020-06-14 DIAGNOSIS — N952 Postmenopausal atrophic vaginitis: Secondary | ICD-10-CM

## 2020-06-14 DIAGNOSIS — Z01419 Encounter for gynecological examination (general) (routine) without abnormal findings: Secondary | ICD-10-CM | POA: Diagnosis not present

## 2020-06-14 DIAGNOSIS — N644 Mastodynia: Secondary | ICD-10-CM | POA: Diagnosis not present

## 2020-06-14 DIAGNOSIS — Z1211 Encounter for screening for malignant neoplasm of colon: Secondary | ICD-10-CM | POA: Diagnosis not present

## 2020-06-14 DIAGNOSIS — L900 Lichen sclerosus et atrophicus: Secondary | ICD-10-CM

## 2020-06-14 LAB — HEMOCCULT GUIAC POC 1CARD (OFFICE): Fecal Occult Blood, POC: NEGATIVE

## 2020-06-14 NOTE — Progress Notes (Signed)
Patient ID: Jordan Weaver, female   DOB: 1955/08/06, 65 y.o.   MRN: [id number] History of Present Illness:  Jordan Weaver is a 65 year old white female,married, PM in for a well woman gyn exam, she had a normal pap with negative HPV 06/02/19. She says lichen sclerosus flaring. Has sore breasts, L>R. Sister in law is living with them now, had pace maker recently.  PCP is Dr Huel Cote.  Current Medications, Allergies, Past Medical History, Past Surgical History, Family History and Social History were reviewed in [employer] record.     Review of Systems:  Patient denies any headaches, hearing loss, fatigue, blurred vision, shortness of breath, chest pain, abdominal pain, problems with bowel movements, urination, or intercourse(not active). No joint pain or mood swings.   Physical Exam:BP 114/70 (BP Location: Left Arm, Patient Position: Sitting, Cuff Size: Normal)   Pulse 85   Ht 5' 5.25" (1.657 m)   Wt 161 lb (73 kg)   BMI 26.59 kg/m  General:  Well developed, well nourished, no acute distress Skin:  Warm and dry Neck:  Midline trachea, normal thyroid, good ROM, no lymphadenopathy,no carotid bruits heard  Lungs; Clear to auscultation bilaterally Breast:  No dominant palpable mass, retraction, or nipple discharge,has tenderness outer quadrant both breasts  Cardiovascular: Regular rate and rhythm Abdomen:  Soft, non tender, no hepatosplenomegaly Pelvic:  External genitalia is normal in appearance, no lesions.  The vagina is atrophic and has thin skin at clitoris  and at introitus,used small speculum. Urethra has no lesions or masses. The cervix is smooth.  Uterus is felt to be normal size, shape, and contour.  No adnexal masses or tenderness noted.Bladder is non tender, no masses felt. Rectal: Good sphincter tone, no polyps, or hemorrhoids felt.  Hemoccult negative. Extremities/musculoskeletal:  No swelling or varicosities noted, no clubbing or cyanosis Psych:  No mood changes,  alert and cooperative,seems happy AA is 0 Fall risk is low PHQ 9 score is 3 GAD 7 score is 1  [facility] - 06/14/20 1411      Pregnancy Intention Screening   Does the patient want to become pregnant in the next year? N/A    Does the patient's partner want to become pregnant in the next year? N/A    Would the patient like to discuss contraceptive options today? N/A      Contraception Wrap Up   Current Method No Method - Other Reason   postmenopausal   End Method No Method - Other Reason   postmenopausal   Contraception Counseling Provided No         Co exam with Tinnie Gens NP student.   Impression and Plan: 1. Encounter for well woman exam with routine gynecological exam Physical in 1 year Mammogram yearly Colonoscopy per GI Labs with PCP Pap in 2024  2. Encounter for screening fecal occult blood testing   3. Lichen sclerosus et atrophicus Use betamethasone and testosterone cream has refills   4. Vaginal atrophy  5. Breast tenderness Decrease caffeine Don't wear under wire daily

## 2020-06-17 DIAGNOSIS — M5416 Radiculopathy, lumbar region: Secondary | ICD-10-CM | POA: Diagnosis not present

## 2020-06-17 DIAGNOSIS — M545 Low back pain, unspecified: Secondary | ICD-10-CM | POA: Diagnosis not present

## 2020-07-07 DIAGNOSIS — M5412 Radiculopathy, cervical region: Secondary | ICD-10-CM | POA: Insufficient documentation

## 2020-07-07 DIAGNOSIS — Z6827 Body mass index (BMI) 27.0-27.9, adult: Secondary | ICD-10-CM | POA: Diagnosis not present

## 2020-07-07 DIAGNOSIS — M797 Fibromyalgia: Secondary | ICD-10-CM | POA: Diagnosis not present

## 2020-07-26 DIAGNOSIS — M2042 Other hammer toe(s) (acquired), left foot: Secondary | ICD-10-CM | POA: Diagnosis not present

## 2020-07-26 DIAGNOSIS — M2012 Hallux valgus (acquired), left foot: Secondary | ICD-10-CM | POA: Diagnosis not present

## 2020-07-26 DIAGNOSIS — L851 Acquired keratosis [keratoderma] palmaris et plantaris: Secondary | ICD-10-CM | POA: Diagnosis not present

## 2020-08-05 DIAGNOSIS — M84375A Stress fracture, left foot, initial encounter for fracture: Secondary | ICD-10-CM | POA: Diagnosis not present

## 2020-08-05 DIAGNOSIS — M79672 Pain in left foot: Secondary | ICD-10-CM | POA: Diagnosis not present

## 2020-08-19 DIAGNOSIS — M79672 Pain in left foot: Secondary | ICD-10-CM | POA: Diagnosis not present

## 2020-08-19 DIAGNOSIS — M84375D Stress fracture, left foot, subsequent encounter for fracture with routine healing: Secondary | ICD-10-CM | POA: Diagnosis not present

## 2020-09-16 DIAGNOSIS — M79672 Pain in left foot: Secondary | ICD-10-CM | POA: Diagnosis not present

## 2020-09-30 ENCOUNTER — Other Ambulatory Visit: Payer: Self-pay | Admitting: Podiatry

## 2020-09-30 DIAGNOSIS — M8430XA Stress fracture, unspecified site, initial encounter for fracture: Secondary | ICD-10-CM

## 2020-10-14 ENCOUNTER — Ambulatory Visit (HOSPITAL_COMMUNITY): Payer: PPO

## 2020-10-17 ENCOUNTER — Ambulatory Visit (HOSPITAL_COMMUNITY): Payer: PPO

## 2020-10-17 ENCOUNTER — Encounter (HOSPITAL_COMMUNITY): Payer: Self-pay

## 2020-10-21 ENCOUNTER — Other Ambulatory Visit: Payer: Self-pay

## 2020-10-21 ENCOUNTER — Ambulatory Visit (HOSPITAL_COMMUNITY)
Admission: RE | Admit: 2020-10-21 | Discharge: 2020-10-21 | Disposition: A | Discharge status: Home / Self Care | Payer: PPO | Source: Ambulatory Visit | Attending: Podiatry | Admitting: Podiatry

## 2020-10-21 DIAGNOSIS — M8430XA Stress fracture, unspecified site, initial encounter for fracture: Secondary | ICD-10-CM

## 2020-10-21 DIAGNOSIS — M7989 Other specified soft tissue disorders: Secondary | ICD-10-CM | POA: Diagnosis not present

## 2020-10-21 DIAGNOSIS — M19072 Primary osteoarthritis, left ankle and foot: Secondary | ICD-10-CM | POA: Diagnosis not present

## 2020-10-24 DIAGNOSIS — L820 Inflamed seborrheic keratosis: Secondary | ICD-10-CM | POA: Diagnosis not present

## 2020-10-24 DIAGNOSIS — L570 Actinic keratosis: Secondary | ICD-10-CM | POA: Diagnosis not present

## 2020-10-24 DIAGNOSIS — L821 Other seborrheic keratosis: Secondary | ICD-10-CM | POA: Diagnosis not present

## 2020-10-24 DIAGNOSIS — X32XXXD Exposure to sunlight, subsequent encounter: Secondary | ICD-10-CM | POA: Diagnosis not present

## 2020-11-03 DIAGNOSIS — Z136 Encounter for screening for cardiovascular disorders: Secondary | ICD-10-CM | POA: Diagnosis not present

## 2020-11-03 DIAGNOSIS — Z6827 Body mass index (BMI) 27.0-27.9, adult: Secondary | ICD-10-CM | POA: Diagnosis not present

## 2020-11-03 DIAGNOSIS — Z0000 Encounter for general adult medical examination without abnormal findings: Secondary | ICD-10-CM | POA: Diagnosis not present

## 2020-11-03 DIAGNOSIS — R5382 Chronic fatigue, unspecified: Secondary | ICD-10-CM | POA: Diagnosis not present

## 2020-11-03 DIAGNOSIS — Z13220 Encounter for screening for lipoid disorders: Secondary | ICD-10-CM | POA: Diagnosis not present

## 2020-11-03 DIAGNOSIS — R7302 Impaired glucose tolerance (oral): Secondary | ICD-10-CM | POA: Diagnosis not present

## 2020-11-07 DIAGNOSIS — M47816 Spondylosis without myelopathy or radiculopathy, lumbar region: Secondary | ICD-10-CM | POA: Diagnosis not present

## 2020-11-08 ENCOUNTER — Encounter: Payer: Self-pay | Admitting: Cardiology

## 2020-11-08 ENCOUNTER — Other Ambulatory Visit: Payer: Self-pay

## 2020-11-08 ENCOUNTER — Ambulatory Visit: Payer: PPO | Admitting: Cardiology

## 2020-11-08 VITALS — BP 120/70 | HR 80 | Ht 66.0 in | Wt 171.4 lb

## 2020-11-08 DIAGNOSIS — R0789 Other chest pain: Secondary | ICD-10-CM

## 2020-11-08 DIAGNOSIS — I5032 Chronic diastolic (congestive) heart failure: Secondary | ICD-10-CM

## 2020-11-08 DIAGNOSIS — M79672 Pain in left foot: Secondary | ICD-10-CM | POA: Diagnosis not present

## 2020-11-08 DIAGNOSIS — R0602 Shortness of breath: Secondary | ICD-10-CM

## 2020-11-08 MED ORDER — FUROSEMIDE 40 MG PO TABS
40.0000 mg | ORAL_TABLET | Freq: Every day | ORAL | 3 refills | Status: DC
Start: 2020-11-08 — End: 2021-12-25

## 2020-11-08 NOTE — Patient Instructions (Addendum)
Medication Instructions:  Change your Lasix to 45m daily, may add 21mas needed for swelling.  Continue all other medications.     Labwork: none  Testing/Procedures: Your physician has requested that you have an echocardiogram. Echocardiography is a painless test that uses sound waves to create images of your heart. It provides your doctor with information about the size and shape of your heart and how well your heart's chambers and valves are working. This procedure takes approximately one hour. There are no restrictions for this procedure. Office will contact with results via phone or letter.     Follow-Up: 6 months   Any Other Special Instructions Will Be Listed Below (If Applicable).  If you need a refill on your cardiac medications before your next appointment, please call your pharmacy.

## 2020-11-08 NOTE — Progress Notes (Signed)
Clinical Summary Jordan Weaver is a 65 y.o.female seen today for follow up of the following medical problems.    1. Chest pain - at prior visit reported midchest pressure/tightness. Up to 6-7/10 in severity. No other associated pain. Not positional - constant pain x 2 days without relief. Can be tender to palpation.  - treated with topical lidocaine patch by pcp with some improvement.    - at prior visit still with pain at times. Often with burping, often occurs after taking her pills in the morning.      - infrequent chest pains since last visit     2. Chronic diastolic HF - 28/7681 echo: LVEF 60-65%, no WMAs, grade II diastoilc dysfunction.    -chronic stable SOB. Can have LE edema at times. - limiting sodium intake  - some DOE walking up stairs with clothes.  - about 15 lbs weight gain since 10/2017 - some LE edema at times      3. 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     benazepril (LOTENSIN) 10 MG tablet 10 mg daily.   3   betamethasone dipropionate 0.05 % cream APPLY CREAM TOPICALLY TWICE DAILY (Patient taking differently: APPLY CREAM TOPICALLY TWICE DAILY PRN) 30 g 0   Cholecalciferol (VITAMIN D3) 125 MCG (5000 UT) CAPS      DULoxetine (CYMBALTA) 30 MG capsule Take 30 mg by mouth 2 (two) times daily.      furosemide (LASIX) 40 MG tablet Take 1  tablet (40 mg total) daily by mouth. 60 tablet 3   meloxicam (MOBIC) 7.5 MG tablet Take by mouth.     omeprazole (PRILOSEC) 20 MG capsule Take 20 mg by mouth daily.     pregabalin (LYRICA) 50 MG capsule Take 50-100 mg by mouth 2 (two) times daily.     spironolactone (ALDACTONE) 25 MG tablet Take 25 mg by mouth daily.      TESTOSTERONE NA Apply 1 application topically 3 (three) times a week. 2% cream Monday, Wednesday & Friday prn     traMADol (ULTRAM) 50 MG tablet Take 50 mg by mouth every 8 (eight) hours as needed.     No current facility-administered medications for this visit.     Past Surgical History:  Procedure Laterality Date   BREAST SURGERY     knot removed   COLONOSCOPY N/A 08/21/2017   Procedure: COLONOSCOPY;  Surgeon: Rogene Houston, MD;  [facility]: [facility];  Service: Endoscopy;  Laterality: N/A;  100   DILATION AND CURETTAGE OF UTERUS     multpile   POLYPECTOMY  08/21/2017   Procedure: POLYPECTOMY;  Surgeon: Rogene Houston, MD;  [facility];  Service: Endoscopy;;  colon    ROTATOR CUFF REPAIR     SALPINGOOPHORECTOMY Left 1975  SPINE SURGERY       Allergies  Allergen Reactions   Codeine Nausea And Vomiting   Hydrocodone Nausea And Vomiting and Rash   Tramadol Nausea And Vomiting      Family History  Problem Relation Age of Onset   Asthma Mother    Cancer Mother        breast   Hypertension Mother    Arthritis Mother    Cancer Father        colon   Kidney disease Father    Cancer Sister        breast   Hypertension Sister    Parkinson's disease Paternal Grandmother    Stroke Paternal Grandmother      Social History [name] reports that she has never smoked. She has never used smokeless tobacco. [name] reports no history of alcohol use.   Review of Systems CONSTITUTIONAL: No weight loss, fever, chills, weakness or fatigue.  HEENT: Eyes: No visual loss, blurred vision, double vision or yellow sclerae.No hearing loss,  sneezing, congestion, runny nose or sore throat.  SKIN: No rash or itching.  CARDIOVASCULAR: per hpi RESPIRATORY: No shortness of breath, cough or sputum.  GASTROINTESTINAL: No anorexia, nausea, vomiting or diarrhea. No abdominal pain or blood.  GENITOURINARY: No burning on urination, no polyuria NEUROLOGICAL: No headache, dizziness, syncope, paralysis, ataxia, numbness or tingling in the extremities. No change in bowel or bladder control.  MUSCULOSKELETAL: No muscle, back pain, joint pain or stiffness.  LYMPHATICS: No enlarged nodes. No history of splenectomy.  PSYCHIATRIC: No history of depression or anxiety.  ENDOCRINOLOGIC: No reports of sweating, cold or heat intolerance. No polyuria or polydipsia.  [name]   Physical Examination Today's Vitals   11/08/20 1024  BP: 120/70  Pulse: 80  SpO2: 98%  Weight: 171 lb 6.4 oz (77.7 kg)  Height: 5' 6"  (1.676 m)   Body mass index is 27.66 kg/m.  Gen: resting comfortably, no acute distress HEENT: no scleral icterus, pupils equal round and reactive, no palptable cervical adenopathy,  CV: RRR, no m/r/g no jvd Resp: Clear to auscultation bilaterally GI: abdomen is soft, non-tender, non-distended, normal bowel sounds, no hepatosplenomegaly MSK: extremities are warm, trace bilateral edema Skin: warm, no rash Neuro:  no focal deficits Psych: appropriate affect   Diagnostic Studies  12/2016 echo Study Conclusions   - Left ventricle: The cavity size was normal. Wall thickness was   increased in a pattern of mild LVH. Systolic function was normal.   The estimated ejection fraction was in the range of 60% to 65%.   Wall motion was normal; there were no regional wall motion   abnormalities. Features are consistent with a pseudonormal left   ventricular filling pattern, with concomitant abnormal relaxation   and increased filling pressure (grade 2 diastolic dysfunction). - Pulmonary arteries: Systolic pressure was mildly increased. PA   peak  pressure: 35 mm Hg (S).   Assessment and Plan  1. Atypical chest pain -infrequent symptoms, at this time no indication for ischemic testing.      2. Chronic diastolic HF - echo with grade II diastolic dysfunction.  - some recent DOE. Repeat echo to see if any change in cardiac function - change lasix to 61m daily, may take additional 280mprn swelling.  - if benign echo, symptoms could be related to decreased physical activity and some weight gain.       JoArnoldo LenisM.D.

## 2020-11-19 ENCOUNTER — Other Ambulatory Visit: Payer: Self-pay | Admitting: Adult Health

## 2020-11-28 DIAGNOSIS — Z13820 Encounter for screening for osteoporosis: Secondary | ICD-10-CM | POA: Diagnosis not present

## 2020-11-28 DIAGNOSIS — N958 Other specified menopausal and perimenopausal disorders: Secondary | ICD-10-CM | POA: Diagnosis not present

## 2020-11-28 DIAGNOSIS — M8588 Other specified disorders of bone density and structure, other site: Secondary | ICD-10-CM | POA: Diagnosis not present

## 2020-11-28 DIAGNOSIS — M85852 Other specified disorders of bone density and structure, left thigh: Secondary | ICD-10-CM | POA: Diagnosis not present

## 2020-11-28 DIAGNOSIS — M85851 Other specified disorders of bone density and structure, right thigh: Secondary | ICD-10-CM | POA: Diagnosis not present

## 2020-11-28 DIAGNOSIS — Z780 Asymptomatic menopausal state: Secondary | ICD-10-CM | POA: Diagnosis not present

## 2020-11-29 ENCOUNTER — Other Ambulatory Visit: Payer: Self-pay

## 2020-11-29 ENCOUNTER — Ambulatory Visit (HOSPITAL_COMMUNITY)
Admission: RE | Admit: 2020-11-29 | Discharge: 2020-11-29 | Disposition: A | Discharge status: Home / Self Care | Payer: PPO | Source: Ambulatory Visit | Attending: Cardiology | Admitting: Cardiology

## 2020-11-29 DIAGNOSIS — M533 Sacrococcygeal disorders, not elsewhere classified: Secondary | ICD-10-CM | POA: Diagnosis not present

## 2020-11-29 DIAGNOSIS — R0602 Shortness of breath: Secondary | ICD-10-CM | POA: Diagnosis not present

## 2020-11-29 DIAGNOSIS — M5136 Other intervertebral disc degeneration, lumbar region: Secondary | ICD-10-CM | POA: Diagnosis not present

## 2020-11-29 DIAGNOSIS — M47816 Spondylosis without myelopathy or radiculopathy, lumbar region: Secondary | ICD-10-CM | POA: Diagnosis not present

## 2020-11-29 LAB — ECHOCARDIOGRAM COMPLETE
Area-P 1/2: 3.03 cm2
S' Lateral: 2.4 cm

## 2020-11-29 NOTE — Progress Notes (Signed)
*  PRELIMINARY RESULTS* Echocardiogram 2D Echocardiogram has been performed.  Jordan Weaver 11/29/2020, 2:48 PM

## 2020-12-14 ENCOUNTER — Encounter: Payer: Self-pay | Admitting: *Deleted

## 2020-12-19 DIAGNOSIS — M533 Sacrococcygeal disorders, not elsewhere classified: Secondary | ICD-10-CM | POA: Diagnosis not present

## 2020-12-20 DIAGNOSIS — M79672 Pain in left foot: Secondary | ICD-10-CM | POA: Diagnosis not present

## 2020-12-20 DIAGNOSIS — R609 Edema, unspecified: Secondary | ICD-10-CM | POA: Diagnosis not present

## 2020-12-20 DIAGNOSIS — L859 Epidermal thickening, unspecified: Secondary | ICD-10-CM | POA: Diagnosis not present

## 2020-12-21 NOTE — Telephone Encounter (Signed)
Its rare by statins can have some effect on blood sugars, typically pretty mild. Looking back at her weights she is up about 16 lbs over the last 3 years, I think that would have  a bigger role on blood sugars than the pravastatin. Statins can lower the risk of heart attacks and strokes by around 30%, my recommendation would be to continue it   Zandra Abts MD

## 2020-12-26 ENCOUNTER — Other Ambulatory Visit (HOSPITAL_COMMUNITY): Payer: Self-pay | Admitting: Adult Health

## 2020-12-26 DIAGNOSIS — Z1231 Encounter for screening mammogram for malignant neoplasm of breast: Secondary | ICD-10-CM

## 2021-01-05 DIAGNOSIS — M533 Sacrococcygeal disorders, not elsewhere classified: Secondary | ICD-10-CM | POA: Diagnosis not present

## 2021-01-05 DIAGNOSIS — Z6826 Body mass index (BMI) 26.0-26.9, adult: Secondary | ICD-10-CM | POA: Diagnosis not present

## 2021-01-05 DIAGNOSIS — Z23 Encounter for immunization: Secondary | ICD-10-CM | POA: Diagnosis not present

## 2021-01-05 DIAGNOSIS — M722 Plantar fascial fibromatosis: Secondary | ICD-10-CM | POA: Diagnosis not present

## 2021-01-05 DIAGNOSIS — Z6827 Body mass index (BMI) 27.0-27.9, adult: Secondary | ICD-10-CM | POA: Diagnosis not present

## 2021-01-05 DIAGNOSIS — R030 Elevated blood-pressure reading, without diagnosis of hypertension: Secondary | ICD-10-CM | POA: Diagnosis not present

## 2021-01-05 DIAGNOSIS — R7302 Impaired glucose tolerance (oral): Secondary | ICD-10-CM | POA: Diagnosis not present

## 2021-01-26 ENCOUNTER — Ambulatory Visit (HOSPITAL_COMMUNITY)
Admission: RE | Admit: 2021-01-26 | Discharge: 2021-01-26 | Disposition: A | Discharge status: Home / Self Care | Payer: PPO | Source: Ambulatory Visit | Attending: Adult Health | Admitting: Adult Health

## 2021-01-26 ENCOUNTER — Other Ambulatory Visit: Payer: Self-pay

## 2021-01-26 DIAGNOSIS — Z1231 Encounter for screening mammogram for malignant neoplasm of breast: Secondary | ICD-10-CM | POA: Insufficient documentation

## 2021-02-09 DIAGNOSIS — M25551 Pain in right hip: Secondary | ICD-10-CM | POA: Diagnosis not present

## 2021-02-09 DIAGNOSIS — Z6827 Body mass index (BMI) 27.0-27.9, adult: Secondary | ICD-10-CM | POA: Diagnosis not present

## 2021-02-09 DIAGNOSIS — M25552 Pain in left hip: Secondary | ICD-10-CM | POA: Diagnosis not present

## 2021-02-09 DIAGNOSIS — M25559 Pain in unspecified hip: Secondary | ICD-10-CM | POA: Diagnosis not present

## 2021-02-09 DIAGNOSIS — M79651 Pain in right thigh: Secondary | ICD-10-CM | POA: Diagnosis not present

## 2021-02-10 DIAGNOSIS — M79672 Pain in left foot: Secondary | ICD-10-CM | POA: Diagnosis not present

## 2021-02-10 DIAGNOSIS — M722 Plantar fascial fibromatosis: Secondary | ICD-10-CM | POA: Diagnosis not present

## 2021-05-16 ENCOUNTER — Ambulatory Visit: Payer: PPO | Admitting: Cardiology

## 2021-06-05 ENCOUNTER — Encounter: Payer: Self-pay | Admitting: Adult Health

## 2021-06-05 ENCOUNTER — Other Ambulatory Visit: Payer: Self-pay

## 2021-06-05 ENCOUNTER — Ambulatory Visit (INDEPENDENT_AMBULATORY_CARE_PROVIDER_SITE_OTHER): Payer: PPO | Admitting: Adult Health

## 2021-06-05 VITALS — BP 116/78 | HR 78 | Ht 65.25 in | Wt 172.0 lb

## 2021-06-05 DIAGNOSIS — L900 Lichen sclerosus et atrophicus: Secondary | ICD-10-CM

## 2021-06-05 DIAGNOSIS — Z01419 Encounter for gynecological examination (general) (routine) without abnormal findings: Secondary | ICD-10-CM | POA: Diagnosis not present

## 2021-06-05 DIAGNOSIS — N644 Mastodynia: Secondary | ICD-10-CM

## 2021-06-05 DIAGNOSIS — N952 Postmenopausal atrophic vaginitis: Secondary | ICD-10-CM | POA: Diagnosis not present

## 2021-06-05 DIAGNOSIS — Z1211 Encounter for screening for malignant neoplasm of colon: Secondary | ICD-10-CM | POA: Diagnosis not present

## 2021-06-05 LAB — HEMOCCULT GUIAC POC 1CARD (OFFICE): Fecal Occult Blood, POC: NEGATIVE

## 2021-06-05 NOTE — Progress Notes (Signed)
Patient ID: Jordan Weaver, female   DOB: December 17, 1955, 66 y.o.   MRN: [id number] ?History of Present Illness: ?Jordan Weaver is a 66 year old white female,married, PM in for a well woman gyn exam. ?Lab Results  ?Component Value Date  ? DIAGPAP  06/02/2019  ?  - Negative for intraepithelial lesion or malignancy (NILM)  ? HPV NOT DETECTED 01/27/2018  ? [place] Negative 06/02/2019  ?  ?PCP is Dr Huel Cote ? ?Current Medications, Allergies, Past Medical History, Past Surgical History, Family History and Social History were reviewed in [employer] record.   ? ? ?Review of Systems: ?Patient denies any headaches, hearing loss, fatigue, blurred vision, shortness of breath, chest pain, abdominal pain, problems with bowel movements, urination, or intercourse(not active ). No joint pain or mood swings.  ?Denies any vaginal bleeding ?Has breast tenderness at times. ? ?Physical Exam:BP 116/78 (BP Location: Right Arm, Patient Position: Sitting, Cuff Size: Normal)   Pulse 78   Ht 5' 5.25" (1.657 m)   Wt 172 lb (78 kg)   BMI 28.40 kg/m?   ?General:  Well developed, well nourished, no acute distress ?Skin:  Warm and dry ?Neck:  Midline trachea, normal thyroid, good ROM, no lymphadenopathy,no carotid bruits heard ?Lungs; Clear to auscultation bilaterally ?Breast:  No dominant palpable mass, retraction, or nipple discharge ?Cardiovascular: Regular rate and rhythm ?Abdomen:  Soft, non tender, no hepatosplenomegaly ?Pelvic:  External genitalia is normal in appearance,has white thickened skin, above clitoris area. The vagina is pale and dry. Urethra has no lesions or masses. The cervix is smooth.  Uterus is felt to be normal size, shape, and contour.  No adnexal masses or tenderness noted.Bladder is non tender, no masses felt. ?Rectal: Good sphincter tone, no polyps, or hemorrhoids felt.  Hemoccult negative. ?Extremities/musculoskeletal:  No swelling or varicosities noted, no clubbing or cyanosis ?Psych:  No mood changes, alert  and cooperative,seems happy ?AA is 1 ?Fall risk is low ?Depression screen [facility] 2/9 06/05/2021 06/14/2020 06/02/2019  ?Decreased Interest 0 0 0  ?Down, Depressed, Hopeless 0 0 0  ?PHQ - 2 Score 0 0 0  ?Altered sleeping 0 0 -  ?Tired, decreased energy 2 2 -  ?Change in appetite 1 1 -  ?Feeling bad or failure about yourself  0 0 -  ?Trouble concentrating 0 0 -  ?Moving slowly or fidgety/restless 0 0 -  ?Suicidal thoughts 0 0 -  ?PHQ-9 Score 3 3 -  ?Difficult doing work/chores - - -  ?  ?GAD 7 : Generalized Anxiety Score 06/05/2021 06/14/2020  ?Nervous, Anxious, on Edge 0 0  ?Control/stop worrying 0 0  ?Worry too much - different things 0 0  ?Trouble relaxing 0 0  ?Restless 0 0  ?Easily annoyed or irritable 0 1  ?Afraid - awful might happen 0 0  ?Total GAD 7 Score 0 1  ?Examination chaperoned by Celene Squibb LPN ? ?  ? ?Impression and Plan: ?1. Encounter for well woman exam with routine gynecological exam ?Pap and physical in 1 year ?Labs with PCP ?Mammogram yearly ?Coloscopy per GI ? ?2. Encounter for screening fecal occult blood testing ? ?3. Lichen sclerosus et atrophicus ?She has temovate, with refills  ? ?4. Vaginal atrophy ? ?5. Breast tenderness ? ? ? ? ?  ?  ?

## 2021-07-14 ENCOUNTER — Ambulatory Visit: Payer: PPO | Admitting: Cardiology

## 2021-07-14 ENCOUNTER — Encounter: Payer: Self-pay | Admitting: Cardiology

## 2021-07-14 VITALS — BP 124/80 | HR 92 | Ht 66.0 in | Wt 170.2 lb

## 2021-07-14 DIAGNOSIS — I5032 Chronic diastolic (congestive) heart failure: Secondary | ICD-10-CM

## 2021-07-14 DIAGNOSIS — R0789 Other chest pain: Secondary | ICD-10-CM | POA: Diagnosis not present

## 2021-07-14 DIAGNOSIS — E782 Mixed hyperlipidemia: Secondary | ICD-10-CM | POA: Diagnosis not present

## 2021-07-14 NOTE — Patient Instructions (Signed)
Medication Instructions:  ?Continue all current medications. ? ?Labwork: ?none ? ?Testing/Procedures: ?none ? ?Follow-Up: ?Your physician wants you to follow up in:  1 year.  You should receive a call from the office when due.  If you don't receive this call, please call our office to schedule the follow up appointment   ? ?Any Other Special Instructions Will Be Listed Below (If Applicable). ? ? ?If you need a refill on your cardiac medications before your next appointment, please call your pharmacy. ? ?

## 2021-07-14 NOTE — Progress Notes (Signed)
? ? ? ?Clinical Summary ?Jordan Weaver is a 65 y.o.female seen today for follow up of the following medical problems.  ?  ?1. Chest pain ?- at prior visit reported midchest pressure/tightness. Up to 6-7/10 in severity. No other associated pain. Not positional ?- constant pain x 2 days without relief. Can be tender to palpation.  ?- treated with topical lidocaine patch by pcp with some improvement.  ?  ?- at prior visit still with pain at times. Often with burping, often occurs after taking her pills in the morning.  ? ?  ?- rare infrequent chest pains.  ?  ?2. Chronic diastolic HF ?- 11/6759 echo: LVEF 60-65%, no WMAs, grade II diastoilc dysfunction.  ?  ?  ? 10/2020 echo LVEF 60-65%, no WMAs, normal diastolic fxn ?- swelling is controlled. Lasix 3m daily, will take 237mprn ?- 05/2021 Cr 0.98 K 3.9 ?  ?3. Elevation right hemidiaphragm/Diaphragmatic paralysis ?- chronic SOB. Followed by pulmonary ?  ?4. Bilateral vocal cord parlaysis ?- followed by ENT ?  ?5. Hyperlipidemia ?- 10/2020 TC 138 TG 180 HDL 36 LDL 71 ?- she is on pravastatin ? ? ?  ?  ?SH: her sister is Jordan Weaver a patient of mine ? ? ?Past Medical History:  ?Diagnosis Date  ? Celiac disease   ? GERD (gastroesophageal reflux disease)   ? Hemidiaphragm paralysis   ? Hypertension   ? Vocal cord paralysis   ? ? ? ?Allergies  ?Allergen Reactions  ? Codeine Nausea And Vomiting  ? Hydrocodone Nausea And Vomiting and Rash  ? Tramadol Nausea And Vomiting  ? ? ? ?Current Outpatient Medications  ?Medication Sig Dispense Refill  ? Acetaminophen (TYLENOL PO) Take 500 mg by mouth as needed.    ? benazepril (LOTENSIN) 10 MG tablet 10 mg daily.   3  ? betamethasone dipropionate 0.05 % cream APPLY CREAM TOPICALLY TWICE DAILY PRN 30 g 0  ? Cholecalciferol (VITAMIN D3) 125 MCG (5000 UT) CAPS     ? DULoxetine (CYMBALTA) 30 MG capsule Take 30 mg by mouth 2 (two) times daily.     ? furosemide (LASIX) 40 MG tablet Take 1 tablet (40 mg total) by mouth daily. (MAY TAKE  AN ADDITIONAL 1/2 TAB - 20MG - AS NEEDED FOR SWELLING) 135 tablet 3  ? meloxicam (MOBIC) 7.5 MG tablet Take by mouth.    ? metFORMIN (GLUCOPHAGE-XR) 500 MG 24 hr tablet Take 500 mg by mouth daily with breakfast.    ? omeprazole (PRILOSEC) 20 MG capsule Take 20 mg by mouth daily.    ? pravastatin (PRAVACHOL) 20 MG tablet Take 20 mg by mouth daily.    ? pregabalin (LYRICA) 50 MG capsule Take 50-100 mg by mouth 2 (two) times daily.    ? spironolactone (ALDACTONE) 25 MG tablet Take 25 mg by mouth daily.     ? SUMAtriptan (IMITREX) 50 MG tablet Take 50 mg by mouth every 2 (two) hours as needed for migraine. May repeat in 2 hours if headache persists or recurs.    ? TESTOSTERONE NA Apply 1 application topically 3 (three) times a week. 2% cream Monday, Wednesday & Friday prn    ? traMADol (ULTRAM) 50 MG tablet Take 50 mg by mouth every 8 (eight) hours as needed.    ? ?No current facility-administered medications for this visit.  ? ? ? ?Past Surgical History:  ?Procedure Laterality Date  ? BREAST SURGERY    ? knot removed  ? COLONOSCOPY N/A 08/21/2017  ?  Procedure: COLONOSCOPY;  Surgeon: Rogene Houston, MD;  [facility];  Service: Endoscopy;  Laterality: N/A;  100  ? DILATION AND CURETTAGE OF UTERUS    ? multpile  ? POLYPECTOMY  08/21/2017  ? Procedure: POLYPECTOMY;  Surgeon: Rogene Houston, MD;  Location: [facility];  Service: Endoscopy;;  colon ?  ? ROTATOR CUFF REPAIR    ? SALPINGOOPHORECTOMY Left 1975  ? SPINE SURGERY    ? ? ? ?Allergies  ?Allergen Reactions  ? Codeine Nausea And Vomiting  ? Hydrocodone Nausea And Vomiting and Rash  ? Tramadol Nausea And Vomiting  ? ? ? ? ?Family History  ?Problem Relation Age of Onset  ? Asthma Mother   ? Cancer Mother   ?     breast  ? Hypertension Mother   ? Arthritis Mother   ? Cancer Father   ?     colon  ? Kidney disease Father   ? Cancer Sister   ?     breast  ? Hypertension Sister   ? Parkinson's disease Paternal Grandmother   ? Stroke Paternal Grandmother    ? ? ? ?Social History ?Jordan Weaver reports that she has never smoked. She has never used smokeless tobacco. ?Jordan Weaver reports no history of alcohol use. ? ? ?Review of Systems ?CONSTITUTIONAL: No weight loss, fever, chills, weakness or fatigue.  ?HEENT: Eyes: No visual loss, blurred vision, double vision or yellow sclerae.No hearing loss, sneezing, congestion, runny nose or sore throat.  ?SKIN: No rash or itching.  ?CARDIOVASCULAR: per hpi ?RESPIRATORY: No shortness of breath, cough or sputum.  ?GASTROINTESTINAL: No anorexia, nausea, vomiting or diarrhea. No abdominal pain or blood.  ?GENITOURINARY: No burning on urination, no polyuria ?NEUROLOGICAL: No headache, dizziness, syncope, paralysis, ataxia, numbness or tingling in the extremities. No change in bowel or bladder control.  ?MUSCULOSKELETAL: No muscle, back pain, joint pain or stiffness.  ?LYMPHATICS: No enlarged nodes. No history of splenectomy.  ?PSYCHIATRIC: No history of depression or anxiety.  ?ENDOCRINOLOGIC: No reports of sweating, cold or heat intolerance. No polyuria or polydipsia.  ?. ? ? ?Physical Examination ?Today's Vitals  ? 07/14/21 1307  ?BP: 124/80  ?Pulse: 92  ?SpO2: 98%  ?Weight: 170 lb 3.2 oz (77.2 kg)  ?Height: 5' 6"  (1.676 m)  ? ?Body mass index is 27.47 kg/m?. ? ?Gen: resting comfortably, no acute distress ?HEENT: no scleral icterus, pupils equal round and reactive, no palptable cervical adenopathy,  ?CV: RRR, no m/r/g no jvd ?Resp: Clear to auscultation bilaterally ?GI: abdomen is soft, non-tender, non-distended, normal bowel sounds, no hepatosplenomegaly ?MSK: extremities are warm, no edema.  ?Skin: warm, no rash ?Neuro:  no focal deficits ?Psych: appropriate affect ? ? ?Diagnostic Studies ? ?12/2016 echo ?Study Conclusions ?  ?- Left ventricle: The cavity size was normal. Wall thickness was ?  increased in a pattern of mild LVH. Systolic function was normal. ?  The estimated ejection fraction was in the range of 60% to 65%. ?  Wall  motion was normal; there were no regional wall motion ?  abnormalities. Features are consistent with a pseudonormal left ?  ventricular filling pattern, with concomitant abnormal relaxation ?  and increased filling pressure (grade 2 diastolic dysfunction). ?- Pulmonary arteries: Systolic pressure was mildly increased. PA ?  peak pressure: 35 mm Hg (S). ?  ?10/2020 echo ?1. Left ventricular ejection fraction, by estimation, is 60 to 65%. The  ?left ventricle has normal function. The left ventricle has no regional  ?  wall motion abnormalities. Left ventricular diastolic parameters were  ?normal.  ? 2. Right ventricular systolic function is normal. The right ventricular  ?size is normal. There is normal pulmonary artery systolic pressure. The  ?estimated right ventricular systolic pressure is 00.7 mmHg.  ? 3. Left atrial size was upper normal.  ? 4. There is a trivial pericardial effusion anterior to the right  ?ventricle.  ? 5. The mitral valve is grossly normal. Trivial mitral valve  ?regurgitation.  ? 6. The aortic valve is tricuspid. Aortic valve regurgitation is not  ?visualized.  ? 7. The inferior vena cava is normal in size with greater than 50%  ?respiratory variability, suggesting right atrial pressure of 3 mmHg.  ? ?Assessment and Plan  ?1. Atypical chest pain ?-ifairly long history of atypical chest pain overall unchanged ?- no indication for ischemic testing, continue to monitor ?  ?  ?2. Chronic diastolic HF ?- euvoelmic today without siginficaitn symptoms ?- continue current lasix dosign ? ?3. Hyperlipidemia ? LDL at goal, continue current meds ? ? ?F/u 1 year.  ?  ? ? ? ? ? ?Arnoldo Lenis, M.D. ?

## 2021-10-16 ENCOUNTER — Ambulatory Visit: Payer: PPO | Admitting: Adult Health

## 2021-10-16 ENCOUNTER — Encounter: Payer: Self-pay | Admitting: Adult Health

## 2021-10-16 VITALS — BP 117/73 | HR 79 | Ht 65.75 in | Wt 171.2 lb

## 2021-10-16 DIAGNOSIS — L900 Lichen sclerosus et atrophicus: Secondary | ICD-10-CM | POA: Diagnosis not present

## 2021-10-16 NOTE — Progress Notes (Signed)
  Subjective:     Patient ID: Jordan Weaver, female   DOB: Jul 08, 1955, 66 y.o.   MRN: [id number]  HPI [name] is a 66 year old white female,married, PM, in complaining of tear in perineal area, it is better has sbeen using clobetasol and testosterone cream. Lab Results  Component Value Date   DIAGPAP  06/02/2019    - Negative for intraepithelial lesion or malignancy (NILM)   HPV NOT DETECTED 01/27/2018   [place] Negative 06/02/2019   PCP is Dr Huel Cote.  Review of Systems Tear in peri area Had burning when had BM or urination   Reviewed past medical,surgical, social and family history. Reviewed medications and allergies.     Objective:   Physical Exam BP 117/73 (BP Location: Left Arm, Patient Position: Sitting, Cuff Size: Normal)   Pulse 79   Ht 5' 5.75" (1.67 m)   Wt 171 lb 3.2 oz (77.7 kg)   BMI 27.84 kg/m     Skin is warm and dry, has white area on perineum and resolving tear. Fall risk is low  Upstream - 10/16/21 0856       Pregnancy Intention Screening   Does the patient want to become pregnant in the next year? No    Does the patient's partner want to become pregnant in the next year? No    Would the patient like to discuss contraceptive options today? No      Contraception Wrap Up   Current Method No Method - Other Reason   postmenopausal   End Method No Method - Other Reason   postmenopausal   Contraception Counseling Provided No            Examination chaperoned by Levy Pupa LPN Assessment:     1. Lichen sclerosus et atrophicus Has resolving tear and white area Continue to use clobetasol cream about 2 x weekly and testosterone about once weekly at the area   Apply Vaseline or Aquaphor if burns with voiding or BM   Plan:     Follow up prn

## 2021-12-25 ENCOUNTER — Other Ambulatory Visit: Payer: Self-pay | Admitting: Cardiology

## 2021-12-26 ENCOUNTER — Other Ambulatory Visit (HOSPITAL_COMMUNITY): Payer: Self-pay | Admitting: Adult Health

## 2021-12-26 DIAGNOSIS — Z1231 Encounter for screening mammogram for malignant neoplasm of breast: Secondary | ICD-10-CM

## 2022-01-31 ENCOUNTER — Ambulatory Visit (HOSPITAL_COMMUNITY)
Admission: RE | Admit: 2022-01-31 | Discharge: 2022-01-31 | Disposition: A | Discharge status: Home / Self Care | Payer: PPO | Source: Ambulatory Visit | Attending: Adult Health | Admitting: Adult Health

## 2022-01-31 DIAGNOSIS — R92323 Mammographic fibroglandular density, bilateral breasts: Secondary | ICD-10-CM | POA: Insufficient documentation

## 2022-01-31 DIAGNOSIS — N6489 Other specified disorders of breast: Secondary | ICD-10-CM | POA: Insufficient documentation

## 2022-01-31 DIAGNOSIS — Z1231 Encounter for screening mammogram for malignant neoplasm of breast: Secondary | ICD-10-CM | POA: Insufficient documentation

## 2022-02-02 ENCOUNTER — Other Ambulatory Visit (HOSPITAL_COMMUNITY): Payer: Self-pay | Admitting: Adult Health

## 2022-02-02 DIAGNOSIS — R928 Other abnormal and inconclusive findings on diagnostic imaging of breast: Secondary | ICD-10-CM

## 2022-02-13 ENCOUNTER — Ambulatory Visit (HOSPITAL_COMMUNITY)
Admission: RE | Admit: 2022-02-13 | Discharge: 2022-02-13 | Disposition: A | Discharge status: Home / Self Care | Payer: PPO | Source: Ambulatory Visit | Attending: Adult Health | Admitting: Adult Health

## 2022-02-13 DIAGNOSIS — R928 Other abnormal and inconclusive findings on diagnostic imaging of breast: Secondary | ICD-10-CM | POA: Diagnosis not present

## 2022-06-23 ENCOUNTER — Other Ambulatory Visit: Payer: Self-pay | Admitting: Cardiology

## 2022-07-06 DIAGNOSIS — R7303 Prediabetes: Secondary | ICD-10-CM | POA: Insufficient documentation

## 2022-07-12 ENCOUNTER — Other Ambulatory Visit (HOSPITAL_COMMUNITY)
Admission: RE | Admit: 2022-07-12 | Discharge: 2022-07-12 | Disposition: A | Discharge status: Home / Self Care | Payer: PPO | Source: Ambulatory Visit | Attending: Adult Health | Admitting: Adult Health

## 2022-07-12 ENCOUNTER — Encounter: Payer: Self-pay | Admitting: Adult Health

## 2022-07-12 ENCOUNTER — Ambulatory Visit (INDEPENDENT_AMBULATORY_CARE_PROVIDER_SITE_OTHER): Payer: PPO | Admitting: Adult Health

## 2022-07-12 VITALS — BP 121/71 | HR 89 | Ht 65.5 in | Wt 160.0 lb

## 2022-07-12 DIAGNOSIS — Z1211 Encounter for screening for malignant neoplasm of colon: Secondary | ICD-10-CM

## 2022-07-12 DIAGNOSIS — Z1151 Encounter for screening for human papillomavirus (HPV): Secondary | ICD-10-CM | POA: Diagnosis not present

## 2022-07-12 DIAGNOSIS — Z01419 Encounter for gynecological examination (general) (routine) without abnormal findings: Secondary | ICD-10-CM | POA: Diagnosis not present

## 2022-07-12 DIAGNOSIS — Q525 Fusion of labia: Secondary | ICD-10-CM

## 2022-07-12 DIAGNOSIS — L900 Lichen sclerosus et atrophicus: Secondary | ICD-10-CM | POA: Diagnosis not present

## 2022-07-12 DIAGNOSIS — N952 Postmenopausal atrophic vaginitis: Secondary | ICD-10-CM

## 2022-07-12 LAB — HEMOCCULT GUIAC POC 1CARD (OFFICE): Fecal Occult Blood, POC: NEGATIVE

## 2022-07-12 NOTE — Progress Notes (Signed)
Patient ID: Jordan Weaver, female   DOB: [birth year], 67 y.o.   MRN: [id number] History of Present Illness: Jordan Weaver is a 67 year old white female, PM in for a well woman gyn exam and pap. She has some occasional soreness in left breast.   PCP is Dr Molinda Bailiff.    Current Medications, Allergies, Past Medical History, Past Surgical History, Family History and Social History were reviewed in [employer] record.     Review of Systems: Patient denies any headaches, hearing loss, fatigue, blurred vision, shortness of breath, chest pain, abdominal pain, problems with bowel movements, urination, or intercourse(does not have often). No joint pain or mood swings.  Has breast soreness occasionally. May spot of toilet paper sticks to her skin.    Physical Exam:BP 121/71 (BP Location: Left Arm, Patient Position: Sitting, Cuff Size: Normal)   Pulse 89   Ht 5' 5.5" (1.664 m)   Wt 160 lb (72.6 kg)   BMI 26.22 kg/m   General:  Well developed, well nourished, no acute distress Skin:  Warm and dry Neck:  Midline trachea, normal thyroid, good ROM, no lymphadenopathy,no carotid bruits heard  Lungs; Clear to auscultation bilaterally Breast:  No dominant palpable mass, retraction, or nipple discharge Cardiovascular: Regular rate and rhythm Abdomen:  Soft, non tender, no hepatosplenomegaly Pelvic:  External genitalia has thin white skin over clitoral area. The vagina is pale with agglutination  of inner labia. Urethra has no lesions or masses. The cervix is smooth, stenotic os, pap with HR HPV genotyping performed.  Uterus is felt to be normal size, shape, and contour.  No adnexal masses or tenderness noted.Bladder is non tender, no masses felt. Rectal: Good sphincter tone, no polyps, or hemorrhoids felt.  Hemoccult negative. Extremities/musculoskeletal:  No swelling or varicosities noted, no clubbing, feet are reddish purple Psych:  No mood changes, alert and cooperative,seems happy AA is  1 Fall risk is low    07/12/2022    1:36 PM 06/05/2021    8:37 AM 06/14/2020    2:11 PM  Depression screen PHQ 2/9  Decreased Interest 0 0 0  Down, Depressed, Hopeless 0 0 0  PHQ - 2 Score 0 0 0  Altered sleeping 0 0 0  Tired, decreased energy 1 2 2   Change in appetite 0 1 1  Feeling bad or failure about yourself  0 0 0  Trouble concentrating 0 0 0  Moving slowly or fidgety/restless 0 0 0  Suicidal thoughts 0 0 0  PHQ-9 Score 1 3 3        07/12/2022    1:36 PM 06/05/2021    8:38 AM 06/14/2020    2:12 PM  GAD 7 : Generalized Anxiety Score  Nervous, Anxious, on Edge 0 0 0  Control/stop worrying 0 0 0  Worry too much - different things 0 0 0  Trouble relaxing 0 0 0  Restless 0 0 0  Easily annoyed or irritable 0 0 1  Afraid - awful might happen 0 0 0  Total GAD 7 Score 0 0 1      Upstream - 07/12/22 1343       Pregnancy Intention Screening   Does the patient want to become pregnant in the next year? N/A    Does the patient's partner want to become pregnant in the next year? N/A    Would the patient like to discuss contraceptive options today? N/A      Contraception Wrap Up   Current Method  No Method - Other Reason   postmenopausal   Reason for No Current Contraceptive Method at Intake ([facility] Only) Other    End Method No Method - Other Reason   postmenopausal   Contraception Counseling Provided No            Examination chaperoned by Malachy Mood LPN  Impression and Plan: 1. Encounter for gynecological examination with Papanicolaou smear of cervix Pap sent If normal can be last pap GYN physical in 2 years Mammogram due in November Colonoscopy per GI Labs with PCP - Cytology - PAP( [place])  2. Encounter for screening fecal occult blood testing Hemoccult was negative   3. Lichen sclerosus et atrophicus Use betamethasone cream 2 x week, has not been using  4. Vaginal atrophy  5. Labia minora agglutination Has testosterone cream that she uses occasioanlly

## 2022-07-18 LAB — CYTOLOGY - PAP
Comment: NEGATIVE
Diagnosis: NEGATIVE
High risk HPV: NEGATIVE

## 2022-08-24 ENCOUNTER — Ambulatory Visit: Payer: PPO | Attending: Cardiology | Admitting: Cardiology

## 2022-08-24 ENCOUNTER — Encounter: Payer: Self-pay | Admitting: Cardiology

## 2022-08-24 VITALS — BP 114/70 | HR 86 | Ht 65.5 in | Wt 161.4 lb

## 2022-08-24 DIAGNOSIS — R0789 Other chest pain: Secondary | ICD-10-CM | POA: Diagnosis not present

## 2022-08-24 DIAGNOSIS — E782 Mixed hyperlipidemia: Secondary | ICD-10-CM

## 2022-08-24 DIAGNOSIS — I5032 Chronic diastolic (congestive) heart failure: Secondary | ICD-10-CM | POA: Diagnosis not present

## 2022-08-24 NOTE — Progress Notes (Signed)
Clinical Summary Jordan Weaver is a 67 y.o.female seen today for follow up of the following medical problems.    1. Chest pain - at prior visit reported midchest pressure/tightness. Up to 6-7/10 in severity. No other associated pain. Not positional - constant pain x 2 days without relief. Can be tender to palpation.  - treated with topical lidocaine patch by pcp with some improvement.       - infrequent chest pains, short in duration.    2. Chronic diastolic HF - 12/2016 echo: LVEF 60-65%, no WMAs, grade II diastoilc dysfunction.   10/2020 echo LVEF 60-65%, no WMAs, normal diastolic fxn  - occasional LE edema. Chronic stable SOB/DOE - takes lasix 40mg  daily, will take additional 20mg  prn   3. 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 Followed by pulmonary   4. 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 Hyperlipidemia - she is on pravastatin  10/2021 TC 145 TG 170 HDL 41 LDL 70     SH: her sister is Jordan Weaver also a patient of mine Past Medical History:  Diagnosis Date   Celiac disease    GERD (gastroesophageal reflux disease)    Hemidiaphragm paralysis    Hypertension    Vocal cord paralysis      Allergies  Allergen Reactions   Codeine Nausea And Vomiting   Hydrocodone Nausea And Vomiting and Rash   Tramadol Nausea And Vomiting     Current Outpatient Medications  Medication Sig Dispense Refill   Acetaminophen (TYLENOL PO) Take 500 mg by mouth as needed.     benazepril (LOTENSIN) 10 MG tablet Take 10 mg by mouth daily.  3   betamethasone dipropionate 0.05 % cream APPLY CREAM TOPICALLY TWICE DAILY PRN 30 g 0   Cholecalciferol (VITAMIN D3) 125 MCG (5000 UT) CAPS Take 1 capsule by mouth daily.     DULoxetine (CYMBALTA) 30 MG capsule Take 30 mg by mouth 2 (two) times daily.      furosemide (LASIX) 40 MG tablet TAKE 1 TABLET BY MOUTH ONCE DAILY THEN 1/2 (ONE-HALF) TABLET AS NEEDED FOR SWELLING. 135 tablet 1   metFORMIN  (GLUCOPHAGE-XR) 500 MG 24 hr tablet Take 500 mg by mouth daily with breakfast.     omeprazole (PRILOSEC) 20 MG capsule Take 20 mg by mouth daily.     pravastatin (PRAVACHOL) 20 MG tablet Take 20 mg by mouth daily.     pregabalin (LYRICA) 50 MG capsule Take 50-100 mg by mouth 2 (two) times daily.     spironolactone (ALDACTONE) 25 MG tablet Take 25 mg by mouth daily.      TESTOSTERONE NA Apply 1 application topically 3 (three) times a week. 2% cream Monday, Wednesday & Friday prn (Patient not taking: Reported on 07/12/2022)     traMADol (ULTRAM) 50 MG tablet Take 50 mg by mouth every 8 (eight) hours as needed.     No current facility-administered medications for this visit.     Past Surgical History:  Procedure Laterality Date   BREAST SURGERY     knot removed   COLONOSCOPY N/A 08/21/2017   Procedure: COLONOSCOPY;  Surgeon: Malissa Hippo, MD;  [facility];  Service: Endoscopy;  Laterality: N/A;  100   DILATION AND CURETTAGE OF UTERUS     multpile   POLYPECTOMY  08/21/2017   Procedure: POLYPECTOMY;  Surgeon: Malissa Hippo, MD;  [facility];  Service: Endoscopy;;  colon    ROTATOR CUFF  REPAIR     SALPINGOOPHORECTOMY Left 1975   SPINE SURGERY       Allergies  Allergen Reactions   Codeine Nausea And Vomiting   Hydrocodone Nausea And Vomiting and Rash   Tramadol Nausea And Vomiting      Family History  Problem Relation Age of Onset   Parkinson's disease Paternal Grandmother    Stroke Paternal Grandmother    Cancer Father        colon   Kidney disease Father    Asthma Mother    Cancer Mother        breast   Hypertension Mother    Arthritis Mother    Kidney disease Sister    Cancer Sister        breast   Hypertension Sister      Social History [name] reports that she has never smoked. She has never used smokeless tobacco. [name] reports no history of alcohol use.   Review of Systems CONSTITUTIONAL: No weight loss, fever, chills,  weakness or fatigue.  HEENT: Eyes: No visual loss, blurred vision, double vision or yellow sclerae.No hearing loss, sneezing, congestion, runny nose or sore throat.  SKIN: No rash or itching.  CARDIOVASCULAR: per hpi RESPIRATORY: No shortness of breath, cough or sputum.  GASTROINTESTINAL: No anorexia, nausea, vomiting or diarrhea. No abdominal pain or blood.  GENITOURINARY: No burning on urination, no polyuria NEUROLOGICAL: No headache, dizziness, syncope, paralysis, ataxia, numbness or tingling in the extremities. No change in bowel or bladder control.  MUSCULOSKELETAL: No muscle, back pain, joint pain or stiffness.  LYMPHATICS: No enlarged nodes. No history of splenectomy.  PSYCHIATRIC: No history of depression or anxiety.  ENDOCRINOLOGIC: No reports of sweating, cold or heat intolerance. No polyuria or polydipsia.  [name]   Physical Examination Today's Vitals   08/24/22 1103  BP: 114/70  Pulse: 86  SpO2: 97%  Weight: 161 lb 6.4 oz (73.2 kg)  Height: 5' 5.5" (1.664 m)   Body mass index is 26.45 kg/m.  Gen: resting comfortably, no acute distress HEENT: no scleral icterus, pupils equal round and reactive, no palptable cervical adenopathy,  CV: RRR, no mrg, no jvd Resp: Clear to auscultation bilaterally GI: abdomen is soft, non-tender, non-distended, normal bowel sounds, no hepatosplenomegaly MSK: extremities are warm, no edema.  Skin: warm, no rash Neuro:  no focal deficits Psych: appropriate affect   Diagnostic Studies  12/2016 echo Study Conclusions   - Left ventricle: The cavity size was normal. Wall thickness was   increased in a pattern of mild LVH. Systolic function was normal.   The estimated ejection fraction was in the range of 60% to 65%.   Wall motion was normal; there were no regional wall motion   abnormalities. Features are consistent with a pseudonormal left   ventricular filling pattern, with concomitant abnormal relaxation   and increased filling pressure  (grade 2 diastolic dysfunction). - Pulmonary arteries: Systolic pressure was mildly increased. PA   peak pressure: 35 mm Hg (S).   10/2020 echo 1. Left ventricular ejection fraction, by estimation, is 60 to 65%. The  left ventricle has normal function. The left ventricle has no regional  wall motion abnormalities. Left ventricular diastolic parameters were  normal.   2. Right ventricular systolic function is normal. The right ventricular  size is normal. There is normal pulmonary artery systolic pressure. The  estimated right ventricular systolic pressure is 27.2 mmHg.   3. Left atrial size was upper normal.   4. There is  a trivial pericardial effusion anterior to the right  ventricle.   5. The mitral valve is grossly normal. Trivial mitral valve  regurgitation.   6. The aortic valve is tricuspid. Aortic valve regurgitation is not  visualized.   7. The inferior vena cava is normal in size with greater than 50%  respiratory variability, suggesting right atrial pressure of 3 mmHg.      Assessment and Plan  1. Atypical chest pain -long history of atypical chest pain overall unchanged - no plans for ischemic testing at this time  - EKG today SR, no ischemic changes   2. Chronic diastolic HF - euvolemic, symnptoms well controlled - continue current meds   3. Hyperlipidemia LDL remains at goal, discussed dietary changes to help TGs   F/u 1 year   Antoine Poche, M.D.

## 2022-08-24 NOTE — Patient Instructions (Signed)

## 2022-12-17 ENCOUNTER — Other Ambulatory Visit: Payer: Self-pay | Admitting: Cardiology

## 2023-01-22 ENCOUNTER — Other Ambulatory Visit (HOSPITAL_COMMUNITY): Payer: Self-pay | Admitting: Adult Health

## 2023-01-22 DIAGNOSIS — Z1231 Encounter for screening mammogram for malignant neoplasm of breast: Secondary | ICD-10-CM

## 2023-02-18 ENCOUNTER — Ambulatory Visit (HOSPITAL_COMMUNITY)
Admission: RE | Admit: 2023-02-18 | Discharge: 2023-02-18 | Disposition: A | Discharge status: Home / Self Care | Payer: PPO | Source: Ambulatory Visit | Attending: Adult Health | Admitting: Adult Health

## 2023-02-18 ENCOUNTER — Encounter (HOSPITAL_COMMUNITY): Payer: Self-pay

## 2023-02-18 DIAGNOSIS — Z1231 Encounter for screening mammogram for malignant neoplasm of breast: Secondary | ICD-10-CM | POA: Insufficient documentation

## 2023-02-20 ENCOUNTER — Other Ambulatory Visit: Payer: Self-pay | Admitting: Adult Health

## 2023-03-25 ENCOUNTER — Other Ambulatory Visit: Payer: Self-pay | Admitting: Cardiology

## 2023-04-22 IMAGING — MR MR FOOT*L* W/O CM
7 series · 40 of 40 positions shown · non-contrast
Comparison: None.

CLINICAL DATA: Pain and swelling about the dorsal aspect of the
left midfoot since July 2020. No known injury.

EXAM:
MRI OF THE LEFT FOOT WITHOUT CONTRAST
TECHNIQUE: Multiplanar, multisequence MR imaging of the ankle was performed. No
intravenous contrast was administered.

[Series 4: T2 fat-sat · axial · left · 3.0mm · 0.44mm/px · z∈[-102,+41]mm · 7 of 42 slices shown (1 of 2)]
[im 1/42]
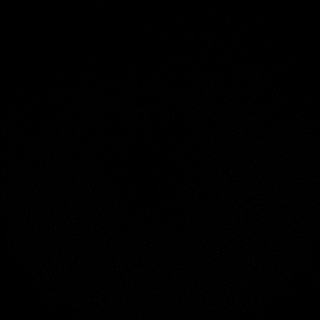
[im 7/42]
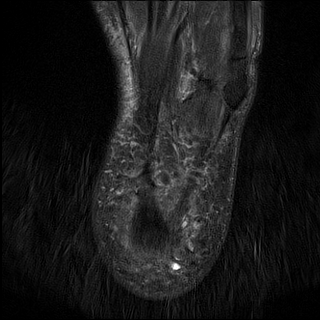
[im 14/42]
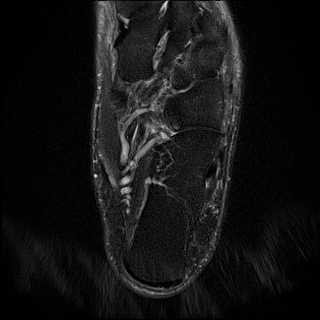
[im 21/42]
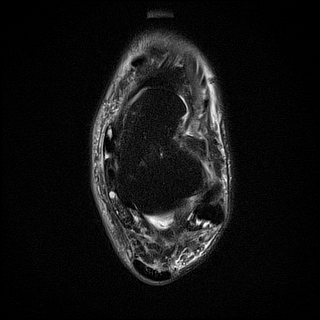
[im 28/42]
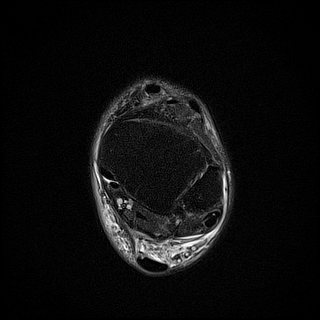
[im 35/42]
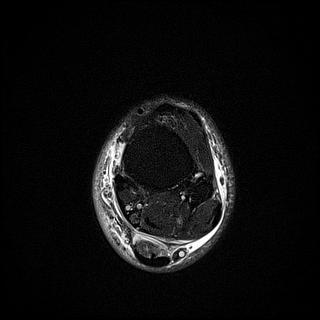
[im 42/42]
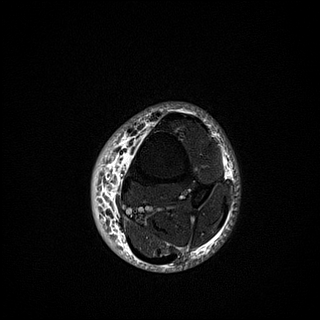

[Series 5: T1 · axial · left · 3.0mm · 0.44mm/px · z∈[-102,+41]mm · 7 of 42 slices shown (1 of 3)]
[im 1/42]
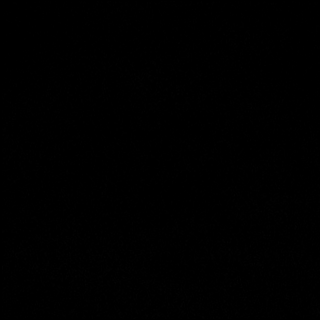
[im 7/42]
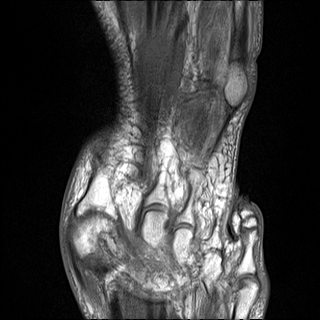
[im 14/42]
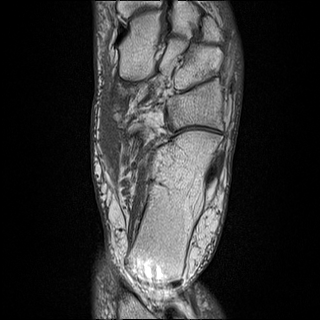
[im 21/42]
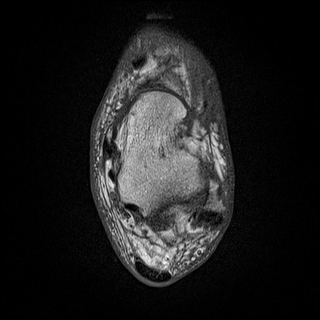
[im 28/42]
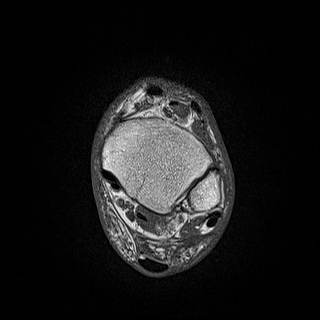
[im 35/42]
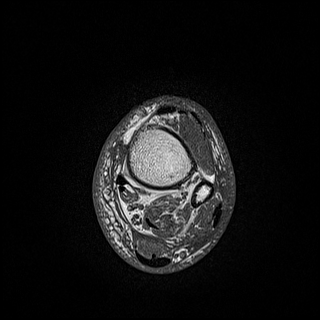
[im 42/42]
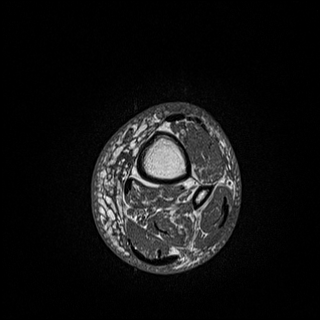

[Series 6: T1 · coronal · left · 3.0mm · 0.62mm/px · 7 of 48 slices shown (2 of 3)]
[im 1/48]
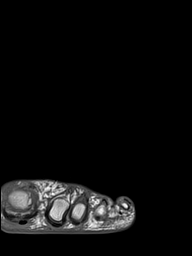
[im 8/48]
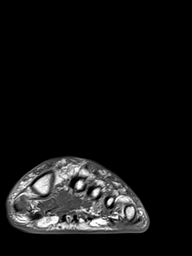
[im 16/48]
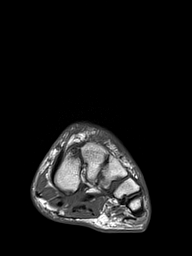
[im 24/48]
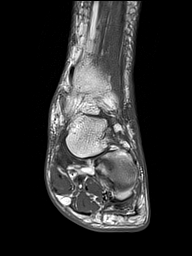
[im 32/48]
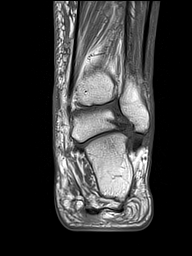
[im 40/48]
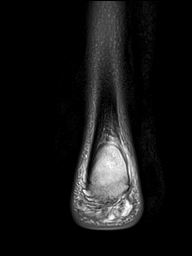
[im 48/48]
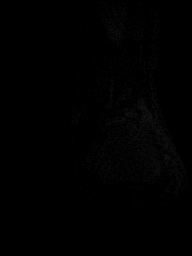

[Series 7: T2 fat-sat · coronal · left · 3.0mm · 0.62mm/px · 7 of 48 slices shown (2 of 2)]
[im 1/48]
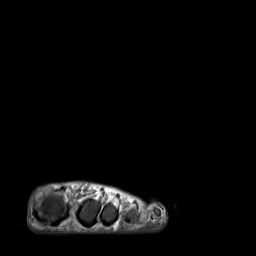
[im 8/48]
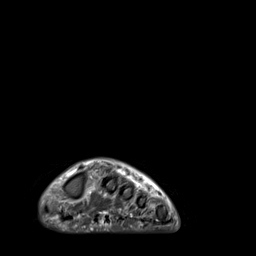
[im 16/48]
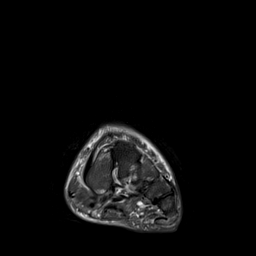
[im 24/48]
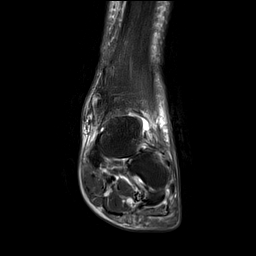
[im 32/48]
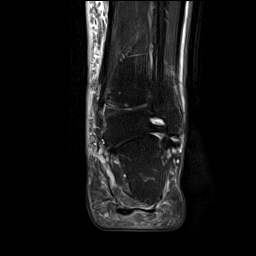
[im 40/48]
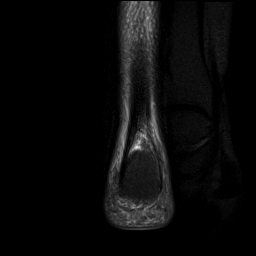
[im 48/48]
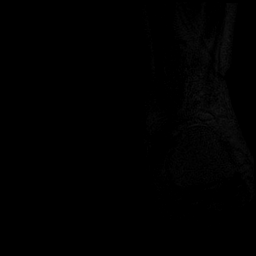

[Series 9: STIR · sagittal · left · 3.0mm · 0.35mm/px · 3 of 21 slices shown]
[im 1/21]
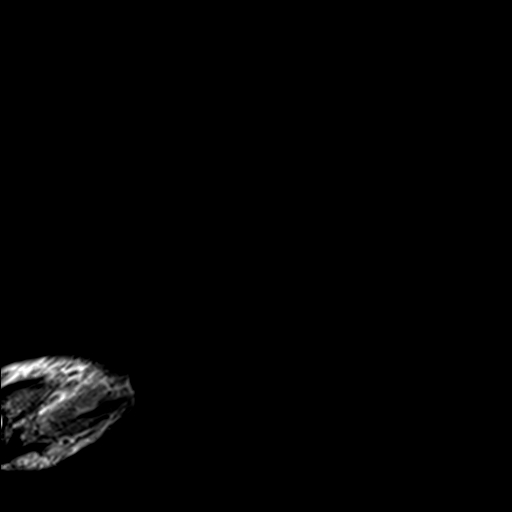
[im 11/21]
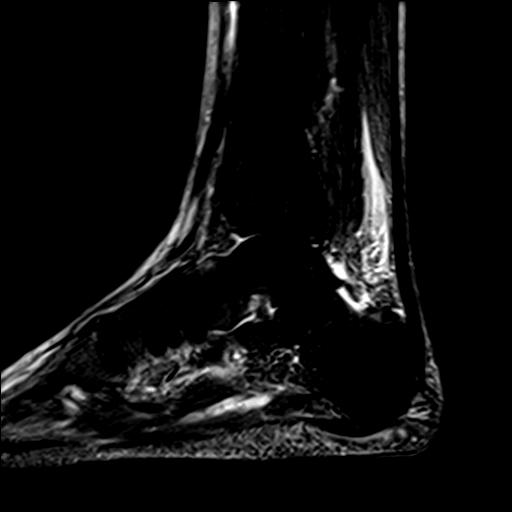
[im 21/21]
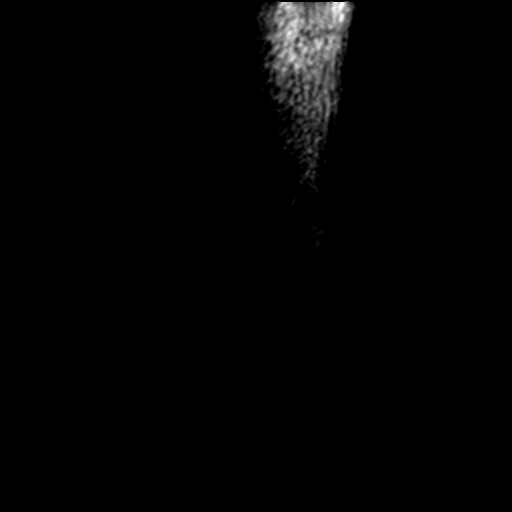

[Series 10: PD fat-sat · axial · left · 3.0mm · 0.47mm/px · z∈[-107,+14]mm · 5 of 36 slices shown]
[im 1/36]
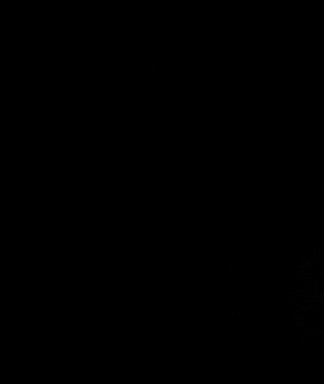
[im 9/36]
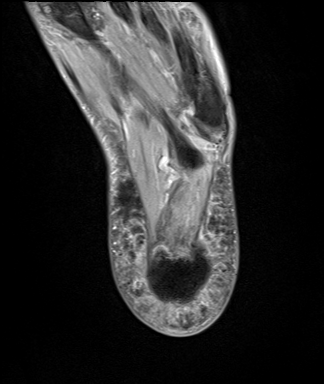
[im 18/36]
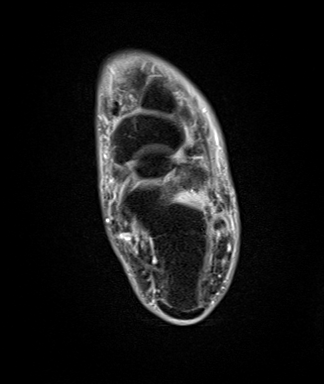
[im 27/36]
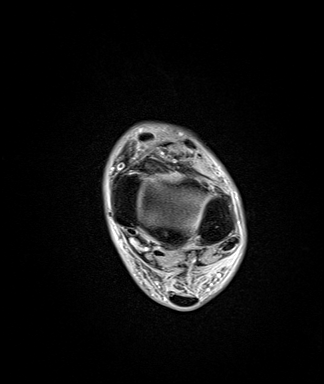
[im 36/36]
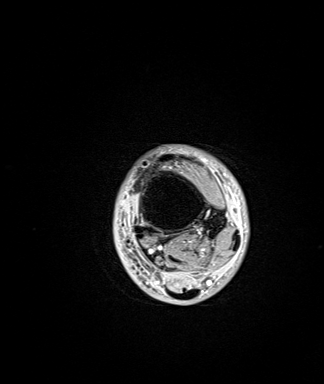

[Series 11: T1 · sagittal · left · 4.0mm · 0.48mm/px · 4 of 30 slices shown (3 of 3)]
[im 1/30]
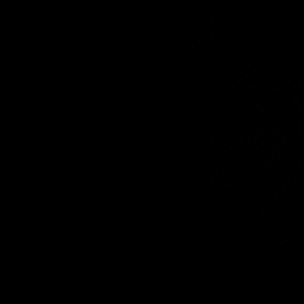
[im 10/30]
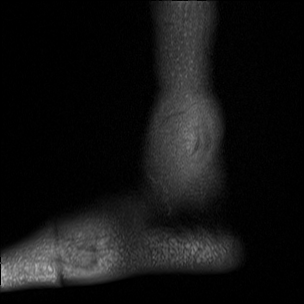
[im 20/30]
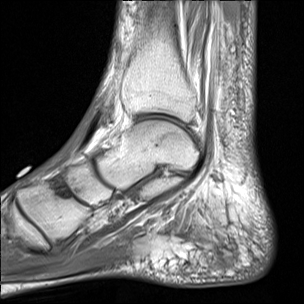
[im 30/30]
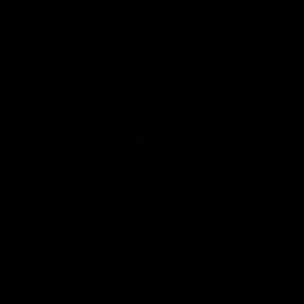

[40 of 40 positions shown; findings below may reference images not displayed]

FINDINGS: TENDONS

Peroneal: Short segment longitudinal split tear of the peroneus
brevis is seen just inferior to the lateral malleolus. The peroneus
longus appears normal.

Posteromedial: Intact.

Anterior: Intact.

Achilles: Intact.

Plantar Fascia: Intact.  No evidence of plantar fasciitis.

LIGAMENTS

Lateral: Intact. The ATFL is mildly attenuated compatible with
remote sprain.

Medial: Intact

CARTILAGE

Ankle Joint: Normal. No joint effusion or osteochondral lesion of
the talar dome.

Subtalar Joints/Sinus Tarsi: Normal.

Bones: The patient has degenerative disease at the articulation of
the navicular and medial cuneiform. Subchondral cysts are seen in
the dorsal aspect of the medial cuneiform. No fracture, stress
change or worrisome lesion.

Other: No underlying fluid collection, mass or other abnormality is
seen.
IMPRESSION: Degenerative disease about the articulation of the navicular and
medial cuneiform with subchondral cyst formation in the medial
cuneiform may be responsible for the patient's symptoms. The
articulation is just proximal to a marker placed on the region of
concern.

Findings compatible with remote ATFL sprain. The ligament is intact.

Short segment longitudinal split tear of the peroneus brevis.

## 2023-06-27 ENCOUNTER — Telehealth: Payer: Self-pay | Admitting: Cardiology

## 2023-06-27 MED ORDER — FUROSEMIDE 40 MG PO TABS: ORAL_TABLET | ORAL | 1 refills | Status: DC

## 2023-06-27 NOTE — Telephone Encounter (Signed)
*  STAT* If patient is at the pharmacy, call can be transferred to refill team.   1. Which medications need to be refilled? (please list name of each medication and dose if known) furosemide (LASIX) 40 MG tablet   2. Which pharmacy/location (including street and city if local pharmacy) is medication to be sent to? [id number] - [place], [place] - [address]   3. Do they need a 30 day or 90 day supply? 90

## 2023-06-27 NOTE — Telephone Encounter (Signed)
 Filled

## 2023-08-21 ENCOUNTER — Ambulatory Visit (INDEPENDENT_AMBULATORY_CARE_PROVIDER_SITE_OTHER): Payer: Self-pay

## 2023-08-21 VITALS — BP 128/78 | HR 82 | Resp 16 | Ht 66.0 in | Wt 156.1 lb

## 2023-08-21 DIAGNOSIS — E559 Vitamin D deficiency, unspecified: Secondary | ICD-10-CM | POA: Diagnosis not present

## 2023-08-21 DIAGNOSIS — R7303 Prediabetes: Secondary | ICD-10-CM

## 2023-08-21 DIAGNOSIS — E538 Deficiency of other specified B group vitamins: Secondary | ICD-10-CM

## 2023-08-21 DIAGNOSIS — I10 Essential (primary) hypertension: Secondary | ICD-10-CM | POA: Diagnosis not present

## 2023-08-21 DIAGNOSIS — I5032 Chronic diastolic (congestive) heart failure: Secondary | ICD-10-CM | POA: Insufficient documentation

## 2023-08-21 NOTE — Assessment & Plan Note (Signed)
 Recheck levels.  Not on supplementation other than daily MV.

## 2023-08-21 NOTE — Progress Notes (Unsigned)
 New Patient Office Visit  Subjective    Patient ID: Jordan Weaver, female    DOB: 12-Jan-1956  Age: 68 y.o. MRN: [id number]  CC:  Chief Complaint  Patient presents with   Establish Care   HPI Jordan Weaver presents to establish care Here to establish care  Outpatient Encounter Medications as of 08/21/2023  Medication Sig   Acetaminophen (TYLENOL PO) Take 500 mg by mouth as needed.   benazepril (LOTENSIN) 10 MG tablet Take 10 mg by mouth daily.   betamethasone  dipropionate 0.05 % cream APPLY CREAM TOPICALLY TWICE DAILY AS NEEDED   Cholecalciferol (VITAMIN D3) 125 MCG (5000 UT) CAPS Take 1 capsule by mouth daily.   DULoxetine (CYMBALTA) 30 MG capsule Take 30 mg by mouth 2 (two) times daily.    furosemide  (LASIX ) 40 MG tablet Take (1) 40 Mg tablet daily with an extra 1/2 tablet daily as needed for swelling.   omeprazole (PRILOSEC) 20 MG capsule Take 20 mg by mouth daily.   pravastatin (PRAVACHOL) 20 MG tablet Take 20 mg by mouth daily.   pregabalin (LYRICA) 50 MG capsule Take 50-100 mg by mouth 2 (two) times daily.   spironolactone (ALDACTONE) 25 MG tablet Take 25 mg by mouth daily.    TESTOSTERONE NA Apply 1 application  topically 3 (three) times a week. 2% cream Monday, Wednesday & Friday prn   traMADol (ULTRAM) 50 MG tablet Take 50 mg by mouth every 8 (eight) hours as needed.   [DISCONTINUED] metFORMIN (GLUCOPHAGE-XR) 500 MG 24 hr tablet Take 500 mg by mouth daily with breakfast.   No facility-administered encounter medications on file as of 08/21/2023.    Past Medical History:  Diagnosis Date   Celiac disease    GERD (gastroesophageal reflux disease)    Hemidiaphragm paralysis    Hypertension    Vocal cord paralysis     Past Surgical History:  Procedure Laterality Date   BREAST EXCISIONAL BIOPSY Left    age 68 benign   COLONOSCOPY N/A 08/21/2017   Procedure: COLONOSCOPY;  Surgeon: Ruby Corporal, MD;  [facility]: [facility];  Service: Endoscopy;  Laterality: N/A;  100    DILATION AND CURETTAGE OF UTERUS     multpile   POLYPECTOMY  08/21/2017   Procedure: POLYPECTOMY;  Surgeon: Ruby Corporal, MD;  Location: [facility];  Service: Endoscopy;;  colon    ROTATOR CUFF REPAIR     SALPINGOOPHORECTOMY Left 1975   SPINE SURGERY      Family History  Problem Relation Age of Onset   Breast cancer Mother    Asthma Mother    Cancer Mother        breast   Hypertension Mother    Arthritis Mother    Cancer Father        colon   Kidney disease Father    Breast cancer Sister    Kidney disease Sister    Cancer Sister        breast   Hypertension Sister    Parkinson's disease Paternal Grandmother    Stroke Paternal Grandmother     Social History   Socioeconomic History   Marital status: Married    Spouse name: Not on file   Number of children: 0   Years of education: 16   Highest education level: Bachelor's degree (e.g., BA, AB, BS)  Occupational History   Occupation: not working, applying for disability  Tobacco Use   Smoking status: Never   Smokeless tobacco: Never  Vaping Use   Vaping status: Never Used  Substance and Sexual Activity   Alcohol use: No   Drug use: No   Sexual activity: Yes    Birth control/protection: Post-menopausal  Other Topics Concern   Not on file  Social History Narrative   She stopped working in July 2018, previously worked as a Copy at [employer].    Highest level of education:  BS   Lives with husband in a 2 story home.   No children.    Social Drivers of Corporate investment banker Strain: Low Risk  (07/12/2022)   Overall Financial Resource Strain (CARDIA)    Difficulty of Paying Living Expenses: Not hard at all  Food Insecurity: No Food Insecurity (07/12/2022)   Hunger Vital Sign    Worried About Running Out of Food in the Last Year: Never true    Ran Out of Food in the Last Year: Never true  Transportation Needs: No Transportation Needs (07/12/2022)   PRAPARE - Scientist, research (physical sciences) (Medical): No    Lack of Transportation (Non-Medical): No  Physical Activity: Sufficiently Active (07/12/2022)   Exercise Vital Sign    Days of Exercise per Week: 3 days    Minutes of Exercise per Session: 50 min  Stress: No Stress Concern Present (07/12/2022)   [employer] of Occupational Health - Occupational Stress Questionnaire    Feeling of Stress : Not at all  Social Connections: Socially Integrated (07/12/2022)   Social Connection and Isolation Panel [NHANES]    Frequency of Communication with Friends and Family: More than three times a week    Frequency of Social Gatherings with Friends and Family: More than three times a week    Attends Religious Services: More than 4 times per year    Active Member of [employer] or Organizations: Yes    Attends Banker Meetings: 1 to 4 times per year    Marital Status: Married  Catering manager Violence: Not At Risk (07/12/2022)   Humiliation, Afraid, Rape, and Kick questionnaire    Fear of Current or Ex-Partner: No    Emotionally Abused: No    Physically Abused: No    Sexually Abused: No    ROS     Objective    BP 128/78   Pulse 82   Resp 16   Ht 5\' 6"  (1.676 m)   Wt 156 lb 1.9 oz (70.8 kg)   SpO2 94%   BMI 25.20 kg/m   Physical Exam Vitals and nursing note reviewed.  Constitutional:      Appearance: Normal appearance.  HENT:     Head: Normocephalic.     Right Ear: Tympanic membrane, ear canal and external ear normal.     Left Ear: Tympanic membrane, ear canal and external ear normal.     Nose: Nose normal.     Mouth/Throat:     Mouth: Mucous membranes are moist.     Pharynx: Oropharynx is clear.  Cardiovascular:     Rate and Rhythm: Normal rate and regular rhythm.  Pulmonary:     Effort: Pulmonary effort is normal.     Breath sounds: Normal breath sounds.  Musculoskeletal:     Cervical back: Normal range of motion and neck supple.  Skin:    General: Skin is warm and dry.  Neurological:      Mental Status: She is alert and oriented to person, place, and time.  Psychiatric:  Mood and Affect: Mood normal.        Thought Content: Thought content normal.     {Labs (Optional):23779}    Assessment & Plan:   Problem List Items Addressed This Visit       Cardiovascular and Mediastinum   Essential hypertension   Stable at this time.  Continue with current medications.   No refills needed at this time.   F/U in 6 months.        Other   B12 deficiency - Primary   Recheck levels.  Not on supplementation other than daily MV.      Relevant Orders   B12   Prediabetes   Check A1C.  Last A1C was in 12/2022 and was 6.2      Relevant Orders   BMP8+eGFR   HgB A1c   Vitamin D deficiency   Recheck levels.  She is currently taking OTC vitamin D3 in addition to a daily MV.  Follow-up according to lab results.       Relevant Orders   Vitamin D (25 hydroxy)    No follow-ups on file.   Alison Irvine, FNP

## 2023-08-21 NOTE — Assessment & Plan Note (Signed)
 Recheck levels.  She is currently taking OTC vitamin D3 in addition to a daily MV.  Follow-up according to lab results.

## 2023-08-21 NOTE — Assessment & Plan Note (Signed)
 Check A1C.  Last A1C was in 12/2022 and was 6.2

## 2023-08-21 NOTE — Assessment & Plan Note (Signed)
 Stable at this time.  Continue with current medications.   No refills needed at this time.   F/U in 6 months.

## 2023-08-22 LAB — BMP8+EGFR
BUN/Creatinine Ratio: 20 (ref 12–28)
BUN: 19 mg/dL (ref 8–27)
CO2: 26 mmol/L (ref 20–29)
Calcium: 10.2 mg/dL (ref 8.7–10.3)
Chloride: 97 mmol/L (ref 96–106)
Creatinine, Ser: 0.93 mg/dL (ref 0.57–1.00)
Glucose: 86 mg/dL (ref 70–99)
Potassium: 4.2 mmol/L (ref 3.5–5.2)
Sodium: 139 mmol/L (ref 134–144)
eGFR: 67 mL/min/{1.73_m2} (ref >59)

## 2023-08-22 LAB — HEMOGLOBIN A1C
Est. average glucose Bld gHb Est-mCnc: 134 mg/dL
Hgb A1c MFr Bld: 6.3 % — ABNORMAL HIGH (ref 4.8–5.6)

## 2023-08-22 LAB — VITAMIN B12: Vitamin B-12: 346 pg/mL (ref 232–1245)

## 2023-08-22 LAB — VITAMIN D 25 HYDROXY (VIT D DEFICIENCY, FRACTURES): Vit D, 25-Hydroxy: 100 ng/mL (ref 30.0–100.0)

## 2023-08-26 ENCOUNTER — Ambulatory Visit: Payer: Self-pay

## 2023-08-28 ENCOUNTER — Ambulatory Visit: Payer: Self-pay | Attending: Cardiology | Admitting: Cardiology

## 2023-08-28 ENCOUNTER — Encounter: Payer: Self-pay | Admitting: Cardiology

## 2023-08-28 VITALS — BP 112/60 | HR 86 | Ht 66.0 in | Wt 157.6 lb

## 2023-08-28 DIAGNOSIS — R0789 Other chest pain: Secondary | ICD-10-CM

## 2023-08-28 DIAGNOSIS — E782 Mixed hyperlipidemia: Secondary | ICD-10-CM | POA: Diagnosis not present

## 2023-08-28 DIAGNOSIS — I10 Essential (primary) hypertension: Secondary | ICD-10-CM

## 2023-08-28 DIAGNOSIS — I5032 Chronic diastolic (congestive) heart failure: Secondary | ICD-10-CM

## 2023-08-28 NOTE — Progress Notes (Signed)
 Clinical Summary Ms. Grimsley is a 68 y.o.female seen today for follow up of the following medical problems.    1. Chest pain - at prior visit reported midchest pressure/tightness. Up to 6-7/10 in severity. No other associated pain. Not positional - constant pain x 2 days without relief. Can be tender to palpation.  - treated with topical lidocaine patch by pcp with some improvement.     -some pains when gets upset. Dull pain upper midchest, 5/10 in severity. Lasts a few minutes. No other assocaited symptoms -no specific exertional.    2. Chronic diastolic HF - 12/2016 echo: LVEF 60-65%, no WMAs, grade II diastoilc dysfunction.   10/2020 echo LVEF 60-65%, no WMAs, normal diastolic fxn   - takes lasix  40mg  daiy, has 20mg  prn in addition - some swelling at times - chronic SOB, improving with increased activity.    3. 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 Followed by pulmonary   4. 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 Hyperlipidemia - she is on pravastatin  10/2021 TC 145 TG 170 HDL 41 LDL 70 - 12/2022 TC 528 TG 413 HDL 41 LDL 65     SH: her sister is Marinell Siad also a patient of mine Past Medical History:  Diagnosis Date   Celiac disease    GERD (gastroesophageal reflux disease)    Hemidiaphragm paralysis    Hypertension    Vocal cord paralysis      Allergies  Allergen Reactions   Codeine Nausea And Vomiting   Hydrocodone Nausea And Vomiting and Rash   Tramadol Nausea And Vomiting     Current Outpatient Medications  Medication Sig Dispense Refill   Acetaminophen (TYLENOL PO) Take 500 mg by mouth as needed.     benazepril (LOTENSIN) 10 MG tablet Take 10 mg by mouth daily.  3   betamethasone  dipropionate 0.05 % cream APPLY CREAM TOPICALLY TWICE DAILY AS NEEDED 30 g 2   Cholecalciferol (VITAMIN D3) 125 MCG (5000 UT) CAPS Take 1 capsule by mouth daily.     DULoxetine (CYMBALTA) 30 MG capsule Take 30 mg by mouth  2 (two) times daily.      furosemide  (LASIX ) 40 MG tablet Take (1) 40 Mg tablet daily with an extra 1/2 tablet daily as needed for swelling. 135 tablet 1   omeprazole (PRILOSEC) 20 MG capsule Take 20 mg by mouth daily.     pravastatin (PRAVACHOL) 20 MG tablet Take 20 mg by mouth daily.     pregabalin (LYRICA) 50 MG capsule Take 50-100 mg by mouth 2 (two) times daily.     spironolactone (ALDACTONE) 25 MG tablet Take 25 mg by mouth daily.      TESTOSTERONE NA Apply 1 application  topically 3 (three) times a week. 2% cream Monday, Wednesday & Friday prn     traMADol (ULTRAM) 50 MG tablet Take 50 mg by mouth every 8 (eight) hours as needed.     No current facility-administered medications for this visit.     Past Surgical History:  Procedure Laterality Date   BREAST EXCISIONAL BIOPSY Left    age 72 benign   COLONOSCOPY N/A 08/21/2017   Procedure: COLONOSCOPY;  Surgeon: Ruby Corporal, MD;  [facility];  Service: Endoscopy;  Laterality: N/A;  100   DILATION AND CURETTAGE OF UTERUS     multpile   POLYPECTOMY  08/21/2017   Procedure: POLYPECTOMY;  Surgeon: Ruby Corporal, MD;  [facility]: [facility];  Service: Endoscopy;;  colon    ROTATOR CUFF REPAIR     SALPINGOOPHORECTOMY Left 1975   SPINE SURGERY       Allergies  Allergen Reactions   Codeine Nausea And Vomiting   Hydrocodone Nausea And Vomiting and Rash   Tramadol Nausea And Vomiting      Family History  Problem Relation Age of Onset   Breast cancer Mother    Asthma Mother    Cancer Mother        breast   Hypertension Mother    Arthritis Mother    Cancer Father        colon   Kidney disease Father    Breast cancer Sister    Kidney disease Sister    Cancer Sister        breast   Hypertension Sister    Parkinson's disease Paternal Grandmother    Stroke Paternal Grandmother      Social History Ms. Tarbet reports that she has never smoked. She has never used smokeless tobacco. Ms. Ferrante reports  no history of alcohol use.     Physical Examination Today's Vitals   08/28/23 1107  BP: 112/60  Pulse: 86  SpO2: 96%  Weight: 157 lb 9.6 oz (71.5 kg)  Height: 5\' 6"  (1.676 m)   Body mass index is 25.44 kg/m.  Gen: resting comfortably, no acute distress HEENT: no scleral icterus, pupils equal round and reactive, no palptable cervical adenopathy,  CV: RRR, no mrg, no jvd Resp: Clear to auscultation bilaterally GI: abdomen is soft, non-tender, non-distended, normal bowel sounds, no hepatosplenomegaly MSK: extremities are warm, no edema.  Skin: warm, no rash Neuro:  no focal deficits Psych: appropriate affect   Diagnostic Studies 12/2016 echo Study Conclusions   - Left ventricle: The cavity size was normal. Wall thickness was   increased in a pattern of mild LVH. Systolic function was normal.   The estimated ejection fraction was in the range of 60% to 65%.   Wall motion was normal; there were no regional wall motion   abnormalities. Features are consistent with a pseudonormal left   ventricular filling pattern, with concomitant abnormal relaxation   and increased filling pressure (grade 2 diastolic dysfunction). - Pulmonary arteries: Systolic pressure was mildly increased. PA   peak pressure: 35 mm Hg (S).   10/2020 echo 1. Left ventricular ejection fraction, by estimation, is 60 to 65%. The  left ventricle has normal function. The left ventricle has no regional  wall motion abnormalities. Left ventricular diastolic parameters were  normal.   2. Right ventricular systolic function is normal. The right ventricular  size is normal. There is normal pulmonary artery systolic pressure. The  estimated right ventricular systolic pressure is 27.2 mmHg.   3. Left atrial size was upper normal.   4. There is a trivial pericardial effusion anterior to the right  ventricle.   5. The mitral valve is grossly normal. Trivial mitral valve  regurgitation.   6. The aortic valve is  tricuspid. Aortic valve regurgitation is not  visualized.   7. The inferior vena cava is normal in size with greater than 50%  respiratory variability, suggesting right atrial pressure of 3 mmHg.     Assessment and Plan  1. Atypical chest pain -long history of atypical chest pain  -recent symptoms not cardiac in description - EKG today SR, no acute ischemic changes - continue to monitor   2. Chronic diastolic HF - no symptoms, euvolemic today -  continue current meds  3. Hyperlipidemia LDL is at goal, discussed again dietary and lifestyle changes to help lower TGs   F/u 1 year.       Laurann Pollock, M.D

## 2023-08-28 NOTE — Patient Instructions (Addendum)

## 2023-09-17 ENCOUNTER — Other Ambulatory Visit: Payer: Self-pay

## 2023-09-17 DIAGNOSIS — I10 Essential (primary) hypertension: Secondary | ICD-10-CM

## 2023-09-17 MED ORDER — SPIRONOLACTONE 25 MG PO TABS: 25.0000 mg | ORAL_TABLET | Freq: Every day | ORAL | 1 refills | Status: DC

## 2023-10-06 ENCOUNTER — Other Ambulatory Visit: Payer: Self-pay

## 2023-10-06 DIAGNOSIS — M5412 Radiculopathy, cervical region: Secondary | ICD-10-CM

## 2023-10-06 DIAGNOSIS — M5416 Radiculopathy, lumbar region: Secondary | ICD-10-CM

## 2023-10-06 MED ORDER — PREGABALIN 50 MG PO CAPS: 50.0000 mg | ORAL_CAPSULE | Freq: Two times a day (BID) | ORAL | 0 refills | Status: DC

## 2023-10-07 ENCOUNTER — Ambulatory Visit: Admitting: Cardiology

## 2023-10-22 ENCOUNTER — Other Ambulatory Visit: Payer: Self-pay

## 2023-10-22 DIAGNOSIS — I10 Essential (primary) hypertension: Secondary | ICD-10-CM

## 2023-10-22 MED ORDER — BENAZEPRIL HCL 10 MG PO TABS: 10.0000 mg | ORAL_TABLET | Freq: Every day | ORAL | 1 refills | Status: DC

## 2023-11-26 ENCOUNTER — Other Ambulatory Visit: Payer: Self-pay

## 2023-11-26 MED ORDER — DULOXETINE HCL 30 MG PO CPEP: 30.0000 mg | ORAL_CAPSULE | Freq: Every day | ORAL | 1 refills | Status: DC

## 2023-11-27 ENCOUNTER — Telehealth: Payer: Self-pay

## 2023-11-27 NOTE — Telephone Encounter (Signed)
 Copied from CRM [id number]. Topic: Clinical - Prescription Issue >> Nov 27, 2023  4:42 PM Zebedee SAUNDERS wrote: Reason for CRM: Pt called stated DULoxetine  (CYMBALTA ) 30 MG capsule is taken 1 capsule twice a day. Please resend script to [facility] [phone] [place] [address] Phone: [phone] Fax: [phone]

## 2023-11-27 NOTE — Telephone Encounter (Unsigned)
 Copied from CRM [id number]. Topic: Clinical - Prescription Issue >> Nov 27, 2023  4:42 PM Zebedee SAUNDERS wrote: Reason for CRM: Pt called stated DULoxetine  (CYMBALTA ) 30 MG capsule is taken 1 capsule twice a day. Please resend script to [facility] [phone] [place] [address] Phone: [phone] Fax: [phone]

## 2023-11-29 ENCOUNTER — Other Ambulatory Visit: Payer: Self-pay

## 2023-11-29 DIAGNOSIS — M5416 Radiculopathy, lumbar region: Secondary | ICD-10-CM

## 2023-11-29 MED ORDER — DULOXETINE HCL 30 MG PO CPEP: 30.0000 mg | ORAL_CAPSULE | Freq: Two times a day (BID) | ORAL | 1 refills | Status: DC

## 2023-12-05 ENCOUNTER — Other Ambulatory Visit: Payer: Self-pay

## 2023-12-05 DIAGNOSIS — M5412 Radiculopathy, cervical region: Secondary | ICD-10-CM

## 2023-12-05 DIAGNOSIS — E781 Pure hyperglyceridemia: Secondary | ICD-10-CM

## 2023-12-05 DIAGNOSIS — M5416 Radiculopathy, lumbar region: Secondary | ICD-10-CM

## 2023-12-05 MED ORDER — PREGABALIN 50 MG PO CAPS
50.0000 mg | ORAL_CAPSULE | Freq: Two times a day (BID) | ORAL | 2 refills | Status: AC
Start: 2023-12-05 — End: ?

## 2023-12-05 MED ORDER — PRAVASTATIN SODIUM 20 MG PO TABS: 20.0000 mg | ORAL_TABLET | Freq: Every day | ORAL | 3 refills | Status: AC

## 2023-12-16 ENCOUNTER — Other Ambulatory Visit: Payer: Self-pay | Admitting: Cardiology

## 2024-01-15 ENCOUNTER — Other Ambulatory Visit: Payer: Self-pay

## 2024-01-15 DIAGNOSIS — M5416 Radiculopathy, lumbar region: Secondary | ICD-10-CM

## 2024-01-15 NOTE — Telephone Encounter (Unsigned)
 Copied from CRM #[id number]. Topic: Clinical - Medication Refill >> Jan 15, 2024 12:58 PM Emylou G wrote: Medication: DULoxetine  (CYMBALTA ) 30 MG capsule  Pls make sure 90 days / twice daily  Has the patient contacted their pharmacy? Yes (Agent: If no, request that the patient contact the pharmacy for the refill. If patient does not wish to contact the pharmacy document the reason why and proceed with request.) (Agent: If yes, when and what did the pharmacy advise?) said to call us   This is the patient's preferred pharmacy:  [facility] [address] [place] - [address] [place] [address] Phone: [phone] Fax: [phone]  Is this the correct pharmacy for this prescription? Yes If no, delete pharmacy and type the correct one.   Has the prescription been filled recently? No  Is the patient out of the medication? Yes  Has the patient been seen for an appointment in the last year OR does the patient have an upcoming appointment? Yes  Can we respond through MyChart? Yes  Agent: Please be advised that Rx refills may take up to 3 business days. We ask that you follow-up with your pharmacy.

## 2024-01-16 MED ORDER — DULOXETINE HCL 30 MG PO CPEP: 30.0000 mg | ORAL_CAPSULE | Freq: Two times a day (BID) | ORAL | 1 refills | Status: AC

## 2024-01-17 ENCOUNTER — Other Ambulatory Visit (HOSPITAL_COMMUNITY): Payer: Self-pay | Admitting: Adult Health

## 2024-01-17 DIAGNOSIS — Z1231 Encounter for screening mammogram for malignant neoplasm of breast: Secondary | ICD-10-CM

## 2024-01-20 ENCOUNTER — Telehealth: Payer: Self-pay

## 2024-01-20 NOTE — Telephone Encounter (Signed)
 Copied from CRM [id number]. Topic: Clinical - Medication Question >> Jan 20, 2024  3:37 PM Charlet HERO wrote: Reason for CRM: patient is calling about medication she wants to know why the 90 days supply did not called in to phrarmacy only 30 day. The patient would like a call back to discuss.

## 2024-01-21 NOTE — Telephone Encounter (Signed)
 Lmtrc , not sure what medication patient is referring to

## 2024-01-22 NOTE — Telephone Encounter (Signed)
 Patient is returning a call from the office. Patient says the medication she is referring to is DULoxetine  (CYMBALTA ) 30 MG capsule [[id number]]

## 2024-01-22 NOTE — Telephone Encounter (Signed)
 Spoke to husband, he will relay message to wife, explained 90 days with 1 refill was sent to pharmacy, they need to ask pharmacy why they only filled 30 days

## 2024-02-19 ENCOUNTER — Other Ambulatory Visit: Payer: Self-pay

## 2024-02-19 DIAGNOSIS — K219 Gastro-esophageal reflux disease without esophagitis: Secondary | ICD-10-CM

## 2024-02-19 MED ORDER — OMEPRAZOLE 20 MG PO CPDR: 20.0000 mg | DELAYED_RELEASE_CAPSULE | Freq: Every day | ORAL | 3 refills | Status: AC

## 2024-02-21 ENCOUNTER — Encounter (HOSPITAL_COMMUNITY): Payer: Self-pay

## 2024-02-21 ENCOUNTER — Ambulatory Visit

## 2024-02-21 ENCOUNTER — Ambulatory Visit (HOSPITAL_COMMUNITY)
Admission: RE | Admit: 2024-02-21 | Discharge: 2024-02-21 | Disposition: A | Discharge status: Home / Self Care | Source: Ambulatory Visit | Attending: Adult Health | Admitting: Adult Health

## 2024-02-21 VITALS — BP 131/79 | HR 75 | Ht 65.0 in | Wt 152.0 lb

## 2024-02-21 DIAGNOSIS — Z13820 Encounter for screening for osteoporosis: Secondary | ICD-10-CM | POA: Diagnosis not present

## 2024-02-21 DIAGNOSIS — Z1231 Encounter for screening mammogram for malignant neoplasm of breast: Secondary | ICD-10-CM | POA: Diagnosis present

## 2024-02-21 DIAGNOSIS — M5412 Radiculopathy, cervical region: Secondary | ICD-10-CM

## 2024-02-21 DIAGNOSIS — E538 Deficiency of other specified B group vitamins: Secondary | ICD-10-CM

## 2024-02-21 DIAGNOSIS — E781 Pure hyperglyceridemia: Secondary | ICD-10-CM | POA: Diagnosis not present

## 2024-02-21 DIAGNOSIS — N951 Menopausal and female climacteric states: Secondary | ICD-10-CM

## 2024-02-21 DIAGNOSIS — R7303 Prediabetes: Secondary | ICD-10-CM

## 2024-02-21 DIAGNOSIS — M5416 Radiculopathy, lumbar region: Secondary | ICD-10-CM

## 2024-02-21 NOTE — Progress Notes (Unsigned)
 Established Patient Office Visit  Subjective   Patient ID: Jordan Weaver, female    DOB: 11-27-1955  Age: 68 y.o. MRN: [id number]  Chief Complaint  Patient presents with   Medical Management of Chronic Issues     6 Mon follow up    Order Dexa Scan     HPI  Patient Active Problem List   Diagnosis Date Noted   Chronic diastolic (congestive) heart failure (HCC) 08/21/2023   Vitamin D  deficiency 08/21/2023   Prediabetes 07/06/2022   Cervical radiculopathy 07/07/2020   Breast tenderness 06/14/2020   Encounter for screening fecal occult blood testing 06/14/2020   Encounter for well woman exam with routine gynecological exam 06/14/2020   Sacroiliac joint dysfunction 03/16/2020   Bilateral high frequency sensorineural hearing loss 03/08/2020   Subjective tinnitus of both ears 03/08/2020   B12 deficiency 10/16/2019   DDD (degenerative disc disease), lumbar 07/08/2019   Vaginal atrophy 06/02/2019   Lichen sclerosus et atrophicus 06/02/2019   Screening for colorectal cancer 06/02/2019   Encounter for gynecological examination with Papanicolaou smear of cervix 06/02/2019   Spondylosis of lumbar spine 06/11/2018   Vocal cord paralysis 10/09/2017   Special screening for malignant neoplasms, colon 05/27/2017   Fatty liver 05/27/2017   Chest pain 05/07/2017   Dyspnea on exertion 01/22/2017   Elevated diaphragm 01/22/2017   Essential hypertension 01/01/2017   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   Lumbar radiculopathy 08/31/2016   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   GERD 11/05/2007   CELIAC DISEASE 11/05/2007   HYPERTRIGLYCERIDEMIA 11/04/2007   ALLERGIC RHINITIS, SEASONAL 11/04/2007   HEMATOCHEZIA 11/04/2007   ABDOMINAL PAIN, EPIGASTRIC 11/04/2007   EARLY SATIETY 11/04/2007   NONSPEC ELEVATION OF LEVELS OF TRANSAMINASE/LDH 11/04/2007   Hematochezia 11/04/2007      ROS    Objective:     BP 131/79   Pulse 75   Ht 5' 5 (1.651 m)   Wt 152 lb (68.9 kg)   SpO2 96%   BMI  25.29 kg/m  BP Readings from Last 3 Encounters:  02/21/24 131/79  08/28/23 112/60  08/21/23 128/78   Wt Readings from Last 3 Encounters:  02/21/24 152 lb (68.9 kg)  08/28/23 157 lb 9.6 oz (71.5 kg)  08/21/23 156 lb 1.9 oz (70.8 kg)      Physical Exam Vitals and nursing note reviewed.  Constitutional:      Appearance: Normal appearance.  HENT:     Head: Normocephalic.  Eyes:     Extraocular Movements: Extraocular movements intact.     Pupils: Pupils are equal, round, and reactive to light.  Cardiovascular:     Rate and Rhythm: Normal rate and regular rhythm.  Pulmonary:     Effort: Pulmonary effort is normal.     Breath sounds: Normal breath sounds.  Musculoskeletal:     Cervical back: Normal range of motion and neck supple.  Neurological:     Mental Status: She is alert and oriented to person, place, and time.  Psychiatric:        Mood and Affect: Mood normal.        Thought Content: Thought content normal.      No results found for any visits on 02/21/24.  Last CBC Lab Results  Component Value Date   WBC 7.5 05/07/2017   HGB 15.0 05/07/2017   HCT 44.4 05/07/2017   MCV 91.4 05/07/2017   MCH 30.9 05/07/2017   RDW 13.3 05/07/2017   PLT 303 05/07/2017   Last metabolic panel Lab Results  Component Value Date   GLUCOSE 86 08/21/2023   NA 139 08/21/2023   K 4.2 08/21/2023   CL 97 08/21/2023   CO2 26 08/21/2023   BUN 19 08/21/2023   CREATININE 0.93 08/21/2023   EGFR 67 08/21/2023   CALCIUM 10.2 08/21/2023   PROT 7.1 05/19/2018   ANIONGAP 13 05/07/2017   Last lipids No results found for: CHOL, HDL, LDLCALC, LDLDIRECT, TRIG, CHOLHDL Last hemoglobin A1c Lab Results  Component Value Date   HGBA1C 6.3 (H) 08/21/2023   Last thyroid  functions Lab Results  Component Value Date   TSH 0.96 05/19/2018   Last vitamin D  Lab Results  Component Value Date   VD25OH 100.0 08/21/2023   Last vitamin B12 and Folate Lab Results  Component Value  Date   VITAMINB12 346 08/21/2023      The 10-year ASCVD risk score (Arnett DK, et al., 2019) is: 11.4%    Assessment & Plan:   Problem List Items Addressed This Visit   None   No follow-ups on file.    Leita Longs, FNP

## 2024-02-24 NOTE — Assessment & Plan Note (Signed)
 Recheck fasting labs.  Currently managed with pravastatin  20 mg.  no medication changes made today.

## 2024-02-24 NOTE — Assessment & Plan Note (Signed)
 Check A1C today

## 2024-02-27 LAB — CMP14+EGFR
ALT: 27 IU/L (ref 0–32)
AST: 19 IU/L (ref 0–40)
Albumin: 4.8 g/dL (ref 3.9–4.9)
Alkaline Phosphatase: 60 IU/L (ref 49–135)
BUN/Creatinine Ratio: 22 (ref 12–28)
BUN: 23 mg/dL (ref 8–27)
Bilirubin Total: 0.9 mg/dL (ref 0.0–1.2)
CO2: 26 mmol/L (ref 20–29)
Calcium: 10 mg/dL (ref 8.7–10.3)
Chloride: 97 mmol/L (ref 96–106)
Creatinine, Ser: 1.04 mg/dL — ABNORMAL HIGH (ref 0.57–1.00)
Globulin, Total: 2.3 g/dL (ref 1.5–4.5)
Glucose: 101 mg/dL — ABNORMAL HIGH (ref 70–99)
Potassium: 4.2 mmol/L (ref 3.5–5.2)
Sodium: 141 mmol/L (ref 134–144)
Total Protein: 7.1 g/dL (ref 6.0–8.5)
eGFR: 58 mL/min/1.73 — ABNORMAL LOW (ref >59)

## 2024-02-27 LAB — TSH+FREE T4
Free T4: 1.24 ng/dL (ref 0.82–1.77)
TSH: 1.08 u[IU]/mL (ref 0.450–4.500)

## 2024-02-27 LAB — VITAMIN B12: Vitamin B-12: 340 pg/mL (ref 232–1245)

## 2024-02-27 LAB — LIPID PANEL
Chol/HDL Ratio: 3 ratio (ref 0.0–4.4)
Cholesterol, Total: 143 mg/dL (ref 100–199)
HDL: 47 mg/dL (ref >39)
LDL Chol Calc (NIH): 76 mg/dL (ref 0–99)
Triglycerides: 113 mg/dL (ref 0–149)
VLDL Cholesterol Cal: 20 mg/dL (ref 5–40)

## 2024-02-27 LAB — HEMOGLOBIN A1C
Est. average glucose Bld gHb Est-mCnc: 128 mg/dL
Hgb A1c MFr Bld: 6.1 % — ABNORMAL HIGH (ref 4.8–5.6)

## 2024-02-28 ENCOUNTER — Ambulatory Visit (HOSPITAL_COMMUNITY)
Admission: RE | Admit: 2024-02-28 | Discharge: 2024-02-28 | Disposition: A | Discharge status: Home / Self Care | Source: Ambulatory Visit

## 2024-02-28 DIAGNOSIS — N951 Menopausal and female climacteric states: Secondary | ICD-10-CM | POA: Insufficient documentation

## 2024-02-28 DIAGNOSIS — Z13820 Encounter for screening for osteoporosis: Secondary | ICD-10-CM | POA: Insufficient documentation

## 2024-03-02 ENCOUNTER — Ambulatory Visit: Payer: Self-pay | Admitting: Adult Health

## 2024-03-17 ENCOUNTER — Other Ambulatory Visit: Payer: Self-pay

## 2024-03-18 ENCOUNTER — Ambulatory Visit: Admitting: Nurse Practitioner

## 2024-03-18 ENCOUNTER — Ambulatory Visit: Payer: Self-pay

## 2024-03-18 ENCOUNTER — Encounter: Payer: Self-pay | Admitting: Nurse Practitioner

## 2024-03-18 VITALS — BP 120/74 | HR 79 | Ht 65.0 in | Wt 149.0 lb

## 2024-03-18 DIAGNOSIS — M545 Low back pain, unspecified: Secondary | ICD-10-CM | POA: Diagnosis not present

## 2024-03-18 MED ORDER — KETOROLAC TROMETHAMINE 60 MG/2ML IM SOLN
60.0000 mg | Freq: Once | INTRAMUSCULAR | Status: AC
  Administered 2024-03-18: 14:00:00 60 mg via INTRAMUSCULAR

## 2024-03-18 MED ORDER — PREDNISONE 20 MG PO TABS: ORAL_TABLET | ORAL | 0 refills | Status: DC

## 2024-03-18 MED ORDER — METHYLPREDNISOLONE ACETATE 80 MG/ML IJ SUSP
80.0000 mg | Freq: Once | INTRAMUSCULAR | Status: AC
  Administered 2024-03-18: 14:00:00 80 mg via INTRAMUSCULAR

## 2024-03-18 NOTE — Progress Notes (Signed)
 Subjective:    Patient ID: Cheryl FORBES Molt, female    DOB: 1955-10-12, 68 y.o.   MRN: [id number]   Chief Complaint: Back Pain (Back pain since recent fall on 12/4)   Back Pain This is a new problem. The current episode started in the past 7 days. The problem occurs intermittently. The problem has been gradually worsening since onset. The pain is present in the lumbar spine. The quality of the pain is described as aching. The pain does not radiate. The pain is at a severity of 6/10. The pain is moderate. The symptoms are aggravated by twisting and position. Pertinent negatives include no headaches, numbness, paresthesias, pelvic pain, perianal numbness or weakness. She has tried analgesics for the symptoms. The treatment provided mild relief.    Patient Active Problem List   Diagnosis Date Noted   Chronic diastolic (congestive) heart failure (HCC) 08/21/2023   Vitamin D  deficiency 08/21/2023   Prediabetes 07/06/2022   Cervical radiculopathy 07/07/2020   Breast tenderness 06/14/2020   Encounter for screening fecal occult blood testing 06/14/2020   Encounter for well woman exam with routine gynecological exam 06/14/2020   Sacroiliac joint dysfunction 03/16/2020   Bilateral high frequency sensorineural hearing loss 03/08/2020   Subjective tinnitus of both ears 03/08/2020   B12 deficiency 10/16/2019   DDD (degenerative disc disease), lumbar 07/08/2019   Vaginal atrophy 06/02/2019   Lichen sclerosus et atrophicus 06/02/2019   Screening for colorectal cancer 06/02/2019   Encounter for gynecological examination with Papanicolaou smear of cervix 06/02/2019   Spondylosis of lumbar spine 06/11/2018   Vocal cord paralysis 10/09/2017   Special screening for malignant neoplasms, colon 05/27/2017   Fatty liver 05/27/2017   Chest pain 05/07/2017   Dyspnea on exertion 01/22/2017   Elevated diaphragm 01/22/2017   Essential hypertension 01/01/2017   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  Musculoskeletal:  Positive for back pain.  Neurological:  Negative for weakness, numbness, headaches and paresthesias.       Objective:   Physical Exam Constitutional:      Appearance: Normal appearance.  Cardiovascular:     Rate and Rhythm: Normal rate and regular rhythm.     Heart sounds: Normal heart sounds.  Pulmonary:     Effort: Pulmonary effort is normal.     Breath sounds: Normal breath sounds.  Musculoskeletal:     Comments: FROM of lumbar spine with pain on flexion and rotation to right and left (-) SLR bil Motor , strength and sensation distally intact  Skin:    General: Skin is warm.  Neurological:     General: No focal deficit present.     Mental Status: She is alert and oriented to person, place, and time.  Psychiatric:        Mood and Affect: Mood normal.        Behavior: Behavior normal.    BP 120/74   Pulse 79   Ht 5' 5 (1.651 m)   Wt 149 lb (67.6 kg)   SpO2 98%   BMI 24.79 kg/m  Assessment & Plan:   IANNA SALMELA in today with chief complaint of Back Pain (Back pain since recent fall on 12/4)   1. Acute midline low back pain without sciatica (Primary) Moist heat Rest Back stretches - methylPREDNISolone  acetate (DEPO-MEDROL ) injection 80 mg - ketorolac  (TORADOL ) injection 60 mg    The above assessment and management plan was discussed with the patient. The patient verbalized understanding of and has agreed to the management plan. Patient is aware to call the clinic if symptoms persist or worsen. Patient  is aware when to return to the clinic for a follow-up visit. Patient educated on when it is appropriate to go to the emergency department.   Mary-Margaret Gladis, FNP

## 2024-03-18 NOTE — Telephone Encounter (Signed)
°  FYI Only or Action Required?: FYI only for provider: appointment scheduled on 03/18/24.  Patient was last seen in primary care on 02/21/2024 by Bevely Doffing, FNP.  Called Nurse Triage reporting Back Pain.  Symptoms began a week ago.  Interventions attempted: OTC medications: tylenol  and Prescription medications: tramadol .  Symptoms are: stable.  Triage Disposition: See PCP When Office is Open (Within 3 Days)  Patient/caregiver understands and will follow disposition?: Yes   Reason for Disposition  [1] MODERATE back pain (e.g., interferes with normal activities) AND [2] present > 3 days  Answer Assessment - Initial Assessment Questions 1. ONSET: When did the pain begin? (e.g., minutes, hours, days)     Started last week , getting worse since, had a fall 12/4 landed L buttocks , scrapped  R knee, no reported  head injury 2. LOCATION: Where does it hurt? (upper, mid or lower back)     Right sided lower back 3. SEVERITY: How bad is the pain?  (e.g., Scale 1-10; mild, moderate, or severe)     6/10, worse with movement   4. RADIATION: Does the pain shoot into your legs or somewhere else?     Localized to right lower back  5. CAUSE:  What do you think is causing the back pain?      Unsure did have a fall 12/4 , didn't hit back but concerned jarrded thought muscular  , had chronic back pain hx lumbar fusion , gets intermittent  coritxzone shots   6. MEDICINES: What have you taken so far for the pain? (e.g., nothing, acetaminophen, NSAIDS)     Tylenol extra strength and 1/2 tramdol today no improvement   7. NEUROLOGIC SYMPTOMS: Do you have any weakness, numbness, or problems with bowel/bladder control?     denies 8. OTHER SYMPTOMS: Do you have any other symptoms? (e.g., fever, abdomen pain, burning with urination, blood in urine)       Denies shortness of breath beyond baseline with exertion, blood in urine , chest pain, abdominal pain, leg pain  Protocols  used: Back Pain-A-AH  Copied from CRM #[id number]. Topic: Clinical - Red Word Triage >> Mar 18, 2024 11:39 AM [name] wrote: Red Word that prompted transfer to Nurse Triage: Patient is experiencing pain in her lower back, fell on 12/04 outside and the pain is getting progressively worse.

## 2024-03-18 NOTE — Patient Instructions (Signed)
 Acute Back Pain, Adult Acute back pain is sudden and usually short-lived. It is often caused by an injury to the muscles and tissues in the back. The injury may result from: A muscle, tendon, or ligament getting overstretched or torn. Ligaments are tissues that connect bones to each other. Lifting something improperly can cause a back strain. Wear and tear (degeneration) of the spinal disks. Spinal disks are circular tissue that provide cushioning between the bones of the spine (vertebrae). Twisting motions, such as while playing sports or doing yard work. A hit to the back. Arthritis. You may have a physical exam, lab tests, and imaging tests to find the cause of your pain. Acute back pain usually goes away with rest and home care. Follow these instructions at home: Managing pain, stiffness, and swelling Take over-the-counter and prescription medicines only as told by your health care provider. Treatment may include medicines for pain and inflammation that are taken by mouth or applied to the skin, or muscle relaxants. Your health care provider may recommend applying ice during the first 24-48 hours after your pain starts. To do this: Put ice in a plastic bag. Place a towel between your skin and the bag. Leave the ice on for 20 minutes, 2-3 times a day. Remove the ice if your skin turns bright red. This is very important. If you cannot feel pain, heat, or cold, you have a greater risk of damage to the area. If directed, apply heat to the affected area as often as told by your health care provider. Use the heat source that your health care provider recommends, such as a moist heat pack or a heating pad. Place a towel between your skin and the heat source. Leave the heat on for 20-30 minutes. Remove the heat if your skin turns bright red. This is especially important if you are unable to feel pain, heat, or cold. You have a greater risk of getting burned. Activity  Do not stay in bed. Staying in  bed for more than 1-2 days can delay your recovery. Sit up and stand up straight. Avoid leaning forward when you sit or hunching over when you stand. If you work at a desk, sit close to it so you do not need to lean over. Keep your chin tucked in. Keep your neck drawn back, and keep your elbows bent at a 90-degree angle (right angle). Sit high and close to the steering wheel when you drive. Add lower back (lumbar) support to your car seat, if needed. Take short walks on even surfaces as soon as you are able. Try to increase the length of time you walk each day. Do not sit, drive, or stand in one place for more than 30 minutes at a time. Sitting or standing for long periods of time can put stress on your back. Do not drive or use heavy machinery while taking prescription pain medicine. Use proper lifting techniques. When you bend and lift, use positions that put less stress on your back: [place] your knees. Keep the load close to your body. Avoid twisting. Exercise regularly as told by your health care provider. Exercising helps your back heal faster and helps prevent back injuries by keeping muscles strong and flexible. Work with a physical therapist to make a safe exercise program, as recommended by your health care provider. Do any exercises as told by your physical therapist. Lifestyle Maintain a healthy weight. Extra weight puts stress on your back and makes it difficult to have good  posture. Avoid activities or situations that make you feel anxious or stressed. Stress and anxiety increase muscle tension and can make back pain worse. Learn ways to manage anxiety and stress, such as through exercise. General instructions Sleep on a firm mattress in a comfortable position. Try lying on your side with your knees slightly bent. If you lie on your back, put a pillow under your knees. Keep your head and neck in a straight line with your spine (neutral position) when using electronic equipment like  smartphones or pads. To do this: Raise your smartphone or pad to look at it instead of bending your head or neck to look down. Put the smartphone or pad at the level of your face while looking at the screen. Follow your treatment plan as told by your health care provider. This may include: Cognitive or behavioral therapy. Acupuncture or massage therapy. Meditation or yoga. Contact a health care provider if: You have pain that is not relieved with rest or medicine. You have increasing pain going down into your legs or buttocks. Your pain does not improve after 2 weeks. You have pain at night. You lose weight without trying. You have a fever or chills. You develop nausea or vomiting. You develop abdominal pain. Get help right away if: You develop new bowel or bladder control problems. You have unusual weakness or numbness in your arms or legs. You feel faint. These symptoms may represent a serious problem that is an emergency. Do not wait to see if the symptoms will go away. Get medical help right away. Call your local emergency services (911 in the [place] Do not drive yourself to the hospital. Summary Acute back pain is sudden and usually short-lived. Use proper lifting techniques. When you bend and lift, use positions that put less stress on your back. Take over-the-counter and prescription medicines only as told by your health care provider, and apply heat or ice as told. This information is not intended to replace advice given to you by your health care provider. Make sure you discuss any questions you have with your health care provider. Document Revised: 06/10/2020 Document Reviewed: 06/10/2020 Elsevier Patient Education  2024 [employer].

## 2024-03-23 ENCOUNTER — Other Ambulatory Visit: Payer: Self-pay

## 2024-03-23 DIAGNOSIS — I10 Essential (primary) hypertension: Secondary | ICD-10-CM

## 2024-03-23 MED ORDER — SPIRONOLACTONE 25 MG PO TABS: 25.0000 mg | ORAL_TABLET | Freq: Every day | ORAL | 1 refills | Status: AC

## 2024-03-24 ENCOUNTER — Other Ambulatory Visit: Payer: Self-pay

## 2024-04-13 ENCOUNTER — Other Ambulatory Visit: Payer: Self-pay

## 2024-04-13 DIAGNOSIS — I10 Essential (primary) hypertension: Secondary | ICD-10-CM

## 2024-04-18 ENCOUNTER — Ambulatory Visit: Payer: Self-pay

## 2024-04-24 ENCOUNTER — Ambulatory Visit (INDEPENDENT_AMBULATORY_CARE_PROVIDER_SITE_OTHER)

## 2024-04-24 VITALS — BP 128/81 | HR 77 | Resp 14 | Ht 65.75 in | Wt 151.0 lb

## 2024-04-24 DIAGNOSIS — Z0000 Encounter for general adult medical examination without abnormal findings: Secondary | ICD-10-CM

## 2024-04-24 DIAGNOSIS — Z23 Encounter for immunization: Secondary | ICD-10-CM | POA: Diagnosis not present

## 2024-04-24 DIAGNOSIS — Z1211 Encounter for screening for malignant neoplasm of colon: Secondary | ICD-10-CM | POA: Diagnosis not present

## 2024-04-24 NOTE — Progress Notes (Signed)
 "  HM Addressed: Vaccines Given today: Flu RT deltoid pneumonia LT deltoid Referral sent to GI for colonoscopy Chief Complaint  Patient presents with   Medicare Wellness     Subjective:   Jordan Weaver is a 69 y.o. female who presents for a Medicare Annual Wellness Visit.  Visit info / Clinical Intake: Medicare Wellness Visit Type:: Subsequent Annual Wellness Visit Persons participating in visit and providing information:: patient Medicare Wellness Visit Mode:: In-person (required for WTM) Interpreter Needed?: No Pre-visit prep was completed: yes AWV questionnaire completed by patient prior to visit?: no Living arrangements:: lives with spouse/significant other Patient's Overall Health Status Rating: very good Typical amount of pain: none Does pain affect daily life?: no Are you currently prescribed opioids?: no  Dietary Habits and Nutritional Risks How many meals a day?: 3 Eats fruit and vegetables daily?: yes Most meals are obtained by: preparing own meals In the last 2 weeks, have you had any of the following?: none Diabetic:: no  Functional Status Activities of Daily Living (to include ambulation/medication): Independent Ambulation: Independent Medication Administration: Independent Home Management (perform basic housework or laundry): Independent Manage your own finances?: yes Primary transportation is: driving Concerns about vision?: no *vision screening is required for WTM* Concerns about hearing?: no  Fall Screening Falls in the past year?: 1 Number of falls in past year: 1 Was there an injury with Fall?: 0 Fall Risk Category Calculator: 2 Patient Fall Risk Level: Moderate Fall Risk  Fall Risk Patient at Risk for Falls Due to: History of fall(s) Fall risk Follow up: Falls evaluation completed; Education provided; Falls prevention discussed  Home and Transportation Safety: All rugs have non-skid backing?: yes All stairs or steps have railings?: yes Grab  bars in the bathtub or shower?: (!) no Have non-skid surface in bathtub or shower?: yes Good home lighting?: yes Regular seat belt use?: yes Hospital stays in the last year:: no  Cognitive Assessment Difficulty concentrating, remembering, or making decisions? : no Will 6CIT or Mini Cog be Completed: yes What year is it?: 0 points What month is it?: 0 points Give patient an address phrase to remember (5 components): [address] [place] About what time is it?: 0 points Count backwards from 20 to 1: 0 points Say the months of the year in reverse: 0 points Repeat the address phrase from earlier: 0 points 6 CIT Score: 0 points  Advance Directives (For Healthcare) Does Patient Have a Medical Advance Directive?: No Would patient like information on creating a medical advance directive?: No - Patient declined  Reviewed/Updated  Reviewed/Updated: Reviewed All (Medical, Surgical, Family, Medications, Allergies, Care Teams, Patient Goals)    Allergies (verified) Codeine, Hydrocodone, and Tramadol   Current Medications (verified) Outpatient Encounter Medications as of 04/24/2024  Medication Sig   Acetaminophen (TYLENOL PO) Take 500 mg by mouth as needed.   benazepril  (LOTENSIN ) 10 MG tablet Take 1 tablet by mouth once daily   betamethasone  dipropionate 0.05 % cream APPLY CREAM TOPICALLY TWICE DAILY AS NEEDED   Cholecalciferol (VITAMIN D3) 125 MCG (5000 UT) CAPS Take 1 capsule by mouth daily.   DULoxetine  (CYMBALTA ) 30 MG capsule Take 1 capsule (30 mg total) by mouth 2 (two) times daily.   furosemide  (LASIX ) 40 MG tablet TAKE 1 TABLET BY MOUTH DAILY WITH AN EXTRA 1/2 (ONE-HALF) TABLET DAILY AS NEEDED FOR  SWELLING   omeprazole  (PRILOSEC) 20 MG capsule Take 1 capsule (20 mg total) by mouth daily.   pravastatin  (PRAVACHOL ) 20 MG tablet  Take 1 tablet (20 mg total) by mouth daily.   pregabalin  (LYRICA ) 50 MG capsule Take 1-2 capsules (50-100 mg total) by mouth 2 (two) times daily.    spironolactone  (ALDACTONE ) 25 MG tablet Take 1 tablet (25 mg total) by mouth daily.   traMADol (ULTRAM) 50 MG tablet Take 50 mg by mouth every 8 (eight) hours as needed.   [DISCONTINUED] predniSONE  (DELTASONE ) 20 MG tablet 2 po at sametime daily for 5 days- (Patient not taking: Reported on 04/24/2024)   [DISCONTINUED] TESTOSTERONE NA Apply 1 application  topically 3 (three) times a week. 2% cream Monday, Wednesday & Friday prn   No facility-administered encounter medications on file as of 04/24/2024.    History: Past Medical History:  Diagnosis Date   Celiac disease    GERD (gastroesophageal reflux disease)    Hemidiaphragm paralysis    Hypertension    Vocal cord paralysis    Past Surgical History:  Procedure Laterality Date   BREAST EXCISIONAL BIOPSY Left    age 26 benign   COLONOSCOPY N/A 08/21/2017   Procedure: COLONOSCOPY;  Surgeon: Golda Claudis PENNER, MD;  Location: [facility];  Service: Endoscopy;  Laterality: N/A;  100   DILATION AND CURETTAGE OF UTERUS     multpile   POLYPECTOMY  08/21/2017   Procedure: POLYPECTOMY;  Surgeon: Golda Claudis PENNER, MD;  Location: [facility];  Service: Endoscopy;;  colon    ROTATOR CUFF REPAIR     SALPINGOOPHORECTOMY Left 1975   SPINE SURGERY     Family History  Problem Relation Age of Onset   Breast cancer Mother    Asthma Mother    Cancer Mother        breast   Hypertension Mother    Arthritis Mother    Cancer Father        colon   Kidney disease Father    Breast cancer Sister    Kidney disease Sister    Cancer Sister        breast   Hypertension Sister    Parkinson's disease Paternal Grandmother    Stroke Paternal Grandmother    Social History   Occupational History   Occupation: not working, applying for disability  Tobacco Use   Smoking status: Never   Smokeless tobacco: Never  Vaping Use   Vaping status: Never Used  Substance and Sexual Activity   Alcohol use: No   Drug use: No   Sexual activity: Yes    Birth  control/protection: Post-menopausal   Tobacco Counseling Counseling given: Yes  SDOH Screenings   Food Insecurity: No Food Insecurity (04/24/2024)  Housing: Low Risk (04/24/2024)  Transportation Needs: No Transportation Needs (04/24/2024)  Utilities: Not At Risk (04/24/2024)  Alcohol Screen: Low Risk (07/12/2022)  Depression (PHQ2-9): Low Risk (04/24/2024)  Financial Resource Strain: Low Risk (07/12/2022)  Physical Activity: Sufficiently Active (04/24/2024)  Social Connections: Moderately Integrated (04/24/2024)  Stress: No Stress Concern Present (04/24/2024)  Tobacco Use: Low Risk (04/24/2024)  Health Literacy: Adequate Health Literacy (04/24/2024)   See flowsheets for full screening details  Depression Screen PHQ 2 & 9 Depression Scale- Over the past 2 weeks, how often have you been bothered by any of the following problems? Little interest or pleasure in doing things: 0 Feeling down, depressed, or hopeless (PHQ Adolescent also includes...irritable): 0 PHQ-2 Total Score: 0 Trouble falling or staying asleep, or sleeping too much: 0 Feeling tired or having little energy: 0 Poor appetite or overeating (PHQ Adolescent also includes...weight loss): 0 Feeling bad  about yourself - or that you are a failure or have let yourself or your family down: 0 Trouble concentrating on things, such as reading the newspaper or watching television (PHQ Adolescent also includes...like school work): 0 Moving or speaking so slowly that other people could have noticed. Or the opposite - being so fidgety or restless that you have been moving around a lot more than usual: 0 Thoughts that you would be better off dead, or of hurting yourself in some way: 0 PHQ-9 Total Score: 0 If you checked off any problems, how difficult have these problems made it for you to do your work, take care of things at home, or get along with other people?: Not difficult at all  Depression Treatment Depression Interventions/Treatment :  EYV7-0 Score <4 Follow-up Not Indicated     Goals Addressed               This Visit's Progress     I would like to get down to 143 lbs (pt-stated)               Objective:    Today's Vitals   04/24/24 1329  BP: 128/81  Pulse: 77  Resp: 14  SpO2: 94%  Weight: 151 lb 0.6 oz (68.5 kg)  Height: 5' 5.75 (1.67 m)   Body mass index is 24.56 kg/m.  Hearing/Vision screen Hearing Screening - Comments:: Patient denies any hearing difficulties.   Vision Screening - Comments:: Patient is not up to date on yearly eye exams.  She will call to schedule her yearly check up  Immunizations and Health Maintenance Health Maintenance  Topic Date Due   Hepatitis C Screening  Never done   DTaP/Tdap/Td (1 - Tdap) Never done   COVID-19 Vaccine (5 - 2025-26 season) 12/02/2023   Medicare Annual Wellness (AWV)  12/04/2023   Colonoscopy  08/21/2024   Influenza Vaccine  06/30/2024 (Originally 11/01/2023)   Mammogram  02/20/2025   Bone Density Scan  02/27/2026   Pneumococcal Vaccine: 50+ Years  Completed   Zoster Vaccines- Shingrix  Completed   Meningococcal B Vaccine  Aged Out   Hepatitis B Vaccines 19-59 Average Risk  Discontinued        Assessment/Plan:  This is a routine wellness examination for Cincere.  Patient Care Team: Bevely Doffing, FNP as PCP - General (Family Medicine) Alvan Dorn FALCON, MD as Consulting Physician (Cardiology) [facility] [place] (Optometry) Tonette Ferebee Desai, Sarah, PA-C as Physician Assistant (Neurosurgery) Darlis Deatrice RAMAN, MD as Consulting Physician (Pain Medicine) Signa Delon LABOR, NP as Nurse Practitioner (Obstetrics and Gynecology) Blinda Katz, DPM as Consulting Physician (Podiatry)  I have personally reviewed and noted the following in the patients chart:   Medical and social history Use of alcohol, tobacco or illicit drugs  Current medications and supplements including opioid prescriptions. Functional  ability and status Nutritional status Physical activity Advanced directives List of other physicians Hospitalizations, surgeries, and ER visits in previous 12 months Vitals Screenings to include cognitive, depression, and falls Referrals and appointments  Orders Placed This Encounter  Procedures   Ambulatory referral to Gastroenterology    Referral Priority:   Routine    Referral Type:   Consultation    Referral Reason:   Specialty Services Required    Referred to Provider:   Cinderella Deatrice FALCON, MD    Number of Visits Requested:   1   In addition, I have reviewed and discussed with patient certain preventive protocols, quality metrics, and best  practice recommendations. A written personalized care plan for preventive services as well as general preventive health recommendations were provided to patient.   Akeel Reffner, CMA   04/24/2024   Return April 26, 2025 at 1:50 pm, for In office Medicare Well Visit w  Wellness Nurse.  After Visit Summary: (MyChart) Due to this being a telephonic visit, the after visit summary with patients personalized plan was offered to patient via MyChart   "

## 2024-04-24 NOTE — Patient Instructions (Signed)
 Jordan Weaver,  Thank you for taking the time for your Medicare Wellness Visit. I appreciate your continued commitment to your health goals. Please review the care plan we discussed, and feel free to reach out if I can assist you further.  Please note that Annual Wellness Visits do not include a physical exam. Some assessments may be limited, especially if the visit was conducted virtually. If needed, we may recommend an in-person follow-up with your provider.  Ongoing Care Seeing your primary care provider every 3 to 6 months helps us  monitor your health and provide consistent, personalized care.   Referrals If a referral was made during today's visit and you haven't received any updates within two weeks, please contact the referred provider directly to check on the status.  [facility] at  [address] Phone: [phone]   Recommended Screenings:  Health Maintenance  Topic Date Due   Hepatitis C Screening  Never done   DTaP/Tdap/Td vaccine (1 - Tdap) Never done   COVID-19 Vaccine (5 - 2025-26 season) 12/02/2023   Medicare Annual Wellness Visit  12/04/2023   Colon Cancer Screening  08/21/2024   Flu Shot  06/30/2024*   Breast Cancer Screening  02/20/2025   Osteoporosis screening with Bone Density Scan  02/27/2026   Pneumococcal Vaccine for age over 27  Completed   Zoster (Shingles) Vaccine  Completed   Meningitis B Vaccine  Aged Out   Hepatitis B Vaccine  Discontinued  *Topic was postponed. The date shown is not the original due date.       04/24/2024    1:33 PM  Advanced Directives  Does Patient Have a Medical Advance Directive? No  Would patient like information on creating a medical advance directive? No - Patient declined    Vision: Annual vision screenings are recommended for early detection of glaucoma, cataracts, and diabetic retinopathy. These exams can also reveal signs of chronic conditions such as diabetes and  high blood pressure.  Dental: Annual dental screenings help detect early signs of oral cancer, gum disease, and other conditions linked to overall health, including heart disease and diabetes.  Please see the attached documents for additional preventive care recommendations.

## 2024-04-28 ENCOUNTER — Encounter (INDEPENDENT_AMBULATORY_CARE_PROVIDER_SITE_OTHER): Payer: Self-pay | Admitting: *Deleted

## 2024-08-03 ENCOUNTER — Ambulatory Visit (INDEPENDENT_AMBULATORY_CARE_PROVIDER_SITE_OTHER): Admitting: Gastroenterology

## 2024-08-21 ENCOUNTER — Ambulatory Visit

## 2025-04-26 ENCOUNTER — Ambulatory Visit: Payer: Self-pay
